# Patient Record
Sex: Female | Born: 1955 | Race: Black or African American | Hispanic: No | Marital: Single | Smoking: Never smoker
Health system: Southern US, Community
[De-identification: ages and names within clinical notes are randomized; demographics above are authoritative.]

## PROBLEM LIST (undated history)

## (undated) DIAGNOSIS — E785 Hyperlipidemia, unspecified: Secondary | ICD-10-CM

## (undated) DIAGNOSIS — R010 Benign and innocent cardiac murmurs: Secondary | ICD-10-CM

## (undated) DIAGNOSIS — I639 Cerebral infarction, unspecified: Secondary | ICD-10-CM

## (undated) DIAGNOSIS — D649 Anemia, unspecified: Secondary | ICD-10-CM

## (undated) DIAGNOSIS — Z9889 Other specified postprocedural states: Secondary | ICD-10-CM

## (undated) DIAGNOSIS — I10 Essential (primary) hypertension: Secondary | ICD-10-CM

## (undated) HISTORY — PX: BREAST EXCISIONAL BIOPSY: SUR124

## (undated) HISTORY — PX: ENDOMETRIAL BIOPSY: SHX622

## (undated) HISTORY — DX: Cerebral infarction, unspecified: I63.9

## (undated) HISTORY — DX: Anemia, unspecified: D64.9

## (undated) HISTORY — DX: Other specified postprocedural states: Z98.890

## (undated) HISTORY — DX: Benign and innocent cardiac murmurs: R01.0

## (undated) HISTORY — PX: BREAST CYST EXCISION: SHX579

## (undated) HISTORY — DX: Hyperlipidemia, unspecified: E78.5

## (undated) HISTORY — PX: BREAST BIOPSY: SHX20

## (undated) HISTORY — PX: ESOPHAGOGASTRODUODENOSCOPY: SHX1529

## (undated) HISTORY — DX: Essential (primary) hypertension: I10

---
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
## 1986-11-14 HISTORY — PX: OTHER SURGICAL HISTORY: SHX169

## 1998-02-16 ENCOUNTER — Encounter: Admission: RE | Admit: 1998-02-16 | Discharge: 1998-02-16 | Payer: Self-pay | Admitting: Sports Medicine

## 1998-02-19 ENCOUNTER — Encounter: Admission: RE | Admit: 1998-02-19 | Discharge: 1998-02-19 | Payer: Self-pay | Admitting: Sports Medicine

## 1998-03-06 ENCOUNTER — Encounter: Admission: RE | Admit: 1998-03-06 | Discharge: 1998-03-06 | Payer: Self-pay | Admitting: Family Medicine

## 1998-03-30 ENCOUNTER — Encounter: Admission: RE | Admit: 1998-03-30 | Discharge: 1998-03-30 | Payer: Self-pay | Admitting: Family Medicine

## 1998-04-01 ENCOUNTER — Encounter: Admission: RE | Admit: 1998-04-01 | Discharge: 1998-04-01 | Payer: Self-pay | Admitting: Family Medicine

## 1998-04-02 ENCOUNTER — Encounter: Admission: RE | Admit: 1998-04-02 | Discharge: 1998-04-02 | Payer: Self-pay | Admitting: Family Medicine

## 1998-04-22 ENCOUNTER — Encounter: Admission: RE | Admit: 1998-04-22 | Discharge: 1998-04-22 | Payer: Self-pay | Admitting: Family Medicine

## 1998-04-29 ENCOUNTER — Encounter: Admission: RE | Admit: 1998-04-29 | Discharge: 1998-04-29 | Payer: Self-pay | Admitting: Family Medicine

## 1998-04-30 ENCOUNTER — Encounter: Admission: RE | Admit: 1998-04-30 | Discharge: 1998-04-30 | Payer: Self-pay | Admitting: Sports Medicine

## 1998-06-16 ENCOUNTER — Encounter: Admission: RE | Admit: 1998-06-16 | Discharge: 1998-06-16 | Payer: Self-pay | Admitting: Family Medicine

## 1998-06-19 ENCOUNTER — Encounter: Admission: RE | Admit: 1998-06-19 | Discharge: 1998-06-19 | Payer: Self-pay | Admitting: Family Medicine

## 1998-06-22 ENCOUNTER — Encounter: Admission: RE | Admit: 1998-06-22 | Discharge: 1998-06-22 | Payer: Self-pay | Admitting: Family Medicine

## 1998-06-23 ENCOUNTER — Encounter: Admission: RE | Admit: 1998-06-23 | Discharge: 1998-06-23 | Payer: Self-pay | Admitting: Family Medicine

## 1998-06-24 ENCOUNTER — Encounter: Admission: RE | Admit: 1998-06-24 | Discharge: 1998-06-24 | Payer: Self-pay | Admitting: Family Medicine

## 1998-06-30 ENCOUNTER — Encounter: Admission: RE | Admit: 1998-06-30 | Discharge: 1998-06-30 | Payer: Self-pay | Admitting: Sports Medicine

## 1998-07-03 ENCOUNTER — Encounter: Admission: RE | Admit: 1998-07-03 | Discharge: 1998-10-01 | Payer: Self-pay | Admitting: Family Medicine

## 1998-07-22 ENCOUNTER — Encounter: Admission: RE | Admit: 1998-07-22 | Discharge: 1998-07-22 | Payer: Self-pay | Admitting: Family Medicine

## 1998-07-27 ENCOUNTER — Encounter: Admission: RE | Admit: 1998-07-27 | Discharge: 1998-07-27 | Payer: Self-pay | Admitting: Family Medicine

## 1998-08-11 ENCOUNTER — Encounter: Admission: RE | Admit: 1998-08-11 | Discharge: 1998-08-11 | Payer: Self-pay | Admitting: Family Medicine

## 1998-08-12 ENCOUNTER — Encounter: Admission: RE | Admit: 1998-08-12 | Discharge: 1998-08-12 | Payer: Self-pay | Admitting: Family Medicine

## 1998-09-03 ENCOUNTER — Encounter: Admission: RE | Admit: 1998-09-03 | Discharge: 1998-09-03 | Payer: Self-pay | Admitting: Family Medicine

## 1998-09-21 ENCOUNTER — Encounter: Admission: RE | Admit: 1998-09-21 | Discharge: 1998-09-21 | Payer: Self-pay | Admitting: Family Medicine

## 1998-10-01 ENCOUNTER — Encounter: Admission: RE | Admit: 1998-10-01 | Discharge: 1998-10-01 | Payer: Self-pay | Admitting: Family Medicine

## 1998-10-15 ENCOUNTER — Encounter: Admission: RE | Admit: 1998-10-15 | Discharge: 1998-10-15 | Payer: Self-pay | Admitting: Family Medicine

## 1998-11-24 ENCOUNTER — Encounter: Admission: RE | Admit: 1998-11-24 | Discharge: 1999-02-22 | Payer: Self-pay | Admitting: Family Medicine

## 1999-01-12 ENCOUNTER — Encounter: Admission: RE | Admit: 1999-01-12 | Discharge: 1999-01-12 | Payer: Self-pay | Admitting: Family Medicine

## 1999-02-02 ENCOUNTER — Encounter: Admission: RE | Admit: 1999-02-02 | Discharge: 1999-02-02 | Payer: Self-pay | Admitting: Family Medicine

## 1999-02-09 ENCOUNTER — Encounter: Admission: RE | Admit: 1999-02-09 | Discharge: 1999-02-09 | Payer: Self-pay | Admitting: Family Medicine

## 1999-02-22 ENCOUNTER — Encounter: Admission: RE | Admit: 1999-02-22 | Discharge: 1999-02-22 | Payer: Self-pay | Admitting: Family Medicine

## 1999-03-01 ENCOUNTER — Encounter: Admission: RE | Admit: 1999-03-01 | Discharge: 1999-03-01 | Payer: Self-pay | Admitting: Sports Medicine

## 1999-03-04 ENCOUNTER — Encounter: Admission: RE | Admit: 1999-03-04 | Discharge: 1999-03-04 | Payer: Self-pay | Admitting: Family Medicine

## 1999-06-16 ENCOUNTER — Encounter: Admission: RE | Admit: 1999-06-16 | Discharge: 1999-06-16 | Payer: Self-pay | Admitting: Family Medicine

## 1999-07-07 ENCOUNTER — Encounter: Admission: RE | Admit: 1999-07-07 | Discharge: 1999-07-07 | Payer: Self-pay | Admitting: Family Medicine

## 1999-09-06 ENCOUNTER — Encounter: Admission: RE | Admit: 1999-09-06 | Discharge: 1999-09-06 | Payer: Self-pay | Admitting: Family Medicine

## 1999-10-20 ENCOUNTER — Encounter: Payer: Self-pay | Admitting: *Deleted

## 1999-10-20 ENCOUNTER — Encounter: Admission: RE | Admit: 1999-10-20 | Discharge: 1999-10-20 | Payer: Self-pay | Admitting: *Deleted

## 1999-12-17 ENCOUNTER — Encounter: Admission: RE | Admit: 1999-12-17 | Discharge: 1999-12-17 | Payer: Self-pay | Admitting: Family Medicine

## 2000-01-18 ENCOUNTER — Encounter: Admission: RE | Admit: 2000-01-18 | Discharge: 2000-01-18 | Payer: Self-pay | Admitting: Sports Medicine

## 2000-02-21 ENCOUNTER — Encounter: Admission: RE | Admit: 2000-02-21 | Discharge: 2000-02-21 | Payer: Self-pay | Admitting: Family Medicine

## 2000-06-15 ENCOUNTER — Encounter: Admission: RE | Admit: 2000-06-15 | Discharge: 2000-06-15 | Payer: Self-pay | Admitting: Family Medicine

## 2000-06-23 ENCOUNTER — Encounter: Admission: RE | Admit: 2000-06-23 | Discharge: 2000-06-23 | Payer: Self-pay | Admitting: Family Medicine

## 2000-09-15 ENCOUNTER — Encounter: Admission: RE | Admit: 2000-09-15 | Discharge: 2000-09-15 | Payer: Self-pay | Admitting: Family Medicine

## 2000-09-19 ENCOUNTER — Encounter: Admission: RE | Admit: 2000-09-19 | Discharge: 2000-09-19 | Payer: Self-pay | Admitting: Family Medicine

## 2000-11-27 ENCOUNTER — Encounter: Payer: Self-pay | Admitting: Obstetrics and Gynecology

## 2000-11-27 ENCOUNTER — Encounter: Admission: RE | Admit: 2000-11-27 | Discharge: 2000-11-27 | Payer: Self-pay | Admitting: Obstetrics and Gynecology

## 2000-12-01 ENCOUNTER — Encounter: Admission: RE | Admit: 2000-12-01 | Discharge: 2000-12-01 | Payer: Self-pay | Admitting: Family Medicine

## 2000-12-12 ENCOUNTER — Encounter: Admission: RE | Admit: 2000-12-12 | Discharge: 2000-12-12 | Payer: Self-pay | Admitting: *Deleted

## 2000-12-12 ENCOUNTER — Encounter: Payer: Self-pay | Admitting: *Deleted

## 2001-01-05 ENCOUNTER — Encounter: Admission: RE | Admit: 2001-01-05 | Discharge: 2001-01-05 | Payer: Self-pay | Admitting: Family Medicine

## 2001-03-06 ENCOUNTER — Encounter: Admission: RE | Admit: 2001-03-06 | Discharge: 2001-03-06 | Payer: Self-pay | Admitting: Sports Medicine

## 2001-03-16 ENCOUNTER — Encounter: Admission: RE | Admit: 2001-03-16 | Discharge: 2001-03-16 | Payer: Self-pay | Admitting: Family Medicine

## 2001-04-30 ENCOUNTER — Encounter: Admission: RE | Admit: 2001-04-30 | Discharge: 2001-04-30 | Payer: Self-pay | Admitting: Family Medicine

## 2001-06-29 ENCOUNTER — Encounter: Admission: RE | Admit: 2001-06-29 | Discharge: 2001-06-29 | Payer: Self-pay | Admitting: Family Medicine

## 2001-07-19 ENCOUNTER — Encounter: Admission: RE | Admit: 2001-07-19 | Discharge: 2001-07-19 | Payer: Self-pay | Admitting: Family Medicine

## 2001-09-25 ENCOUNTER — Other Ambulatory Visit: Admission: RE | Admit: 2001-09-25 | Discharge: 2001-10-16 | Payer: Self-pay | Admitting: Obstetrics & Gynecology

## 2001-09-25 ENCOUNTER — Encounter (INDEPENDENT_AMBULATORY_CARE_PROVIDER_SITE_OTHER): Payer: Self-pay | Admitting: *Deleted

## 2001-09-25 ENCOUNTER — Encounter: Admission: RE | Admit: 2001-09-25 | Discharge: 2001-09-25 | Payer: Self-pay | Admitting: Family Medicine

## 2001-09-28 ENCOUNTER — Encounter: Admission: RE | Admit: 2001-09-28 | Discharge: 2001-09-28 | Payer: Self-pay | Admitting: Family Medicine

## 2001-10-22 ENCOUNTER — Encounter: Admission: RE | Admit: 2001-10-22 | Discharge: 2001-10-22 | Payer: Self-pay | Admitting: Family Medicine

## 2001-12-18 ENCOUNTER — Encounter: Payer: Self-pay | Admitting: Family Medicine

## 2001-12-18 ENCOUNTER — Encounter: Admission: RE | Admit: 2001-12-18 | Discharge: 2001-12-18 | Payer: Self-pay | Admitting: Family Medicine

## 2002-01-30 ENCOUNTER — Encounter: Admission: RE | Admit: 2002-01-30 | Discharge: 2002-01-30 | Payer: Self-pay | Admitting: Family Medicine

## 2002-05-21 ENCOUNTER — Encounter: Admission: RE | Admit: 2002-05-21 | Discharge: 2002-05-21 | Payer: Self-pay | Admitting: Family Medicine

## 2002-05-23 ENCOUNTER — Encounter: Admission: RE | Admit: 2002-05-23 | Discharge: 2002-05-23 | Payer: Self-pay | Admitting: Family Medicine

## 2002-05-27 ENCOUNTER — Encounter: Admission: RE | Admit: 2002-05-27 | Discharge: 2002-05-27 | Payer: Self-pay | Admitting: Family Medicine

## 2002-06-08 ENCOUNTER — Encounter: Payer: Self-pay | Admitting: *Deleted

## 2002-06-08 ENCOUNTER — Emergency Department (HOSPITAL_COMMUNITY): Admission: EM | Admit: 2002-06-08 | Discharge: 2002-06-08 | Payer: Self-pay | Admitting: Emergency Medicine

## 2002-06-17 ENCOUNTER — Encounter: Admission: RE | Admit: 2002-06-17 | Discharge: 2002-06-17 | Payer: Self-pay | Admitting: Family Medicine

## 2002-07-17 ENCOUNTER — Encounter: Admission: RE | Admit: 2002-07-17 | Discharge: 2002-07-17 | Payer: Self-pay | Admitting: Family Medicine

## 2002-08-22 ENCOUNTER — Encounter: Admission: RE | Admit: 2002-08-22 | Discharge: 2002-08-22 | Payer: Self-pay | Admitting: Family Medicine

## 2002-10-15 ENCOUNTER — Encounter: Admission: RE | Admit: 2002-10-15 | Discharge: 2002-10-15 | Payer: Self-pay | Admitting: Sports Medicine

## 2002-10-21 ENCOUNTER — Encounter: Admission: RE | Admit: 2002-10-21 | Discharge: 2002-10-21 | Payer: Self-pay | Admitting: Family Medicine

## 2003-01-17 ENCOUNTER — Encounter: Admission: RE | Admit: 2003-01-17 | Discharge: 2003-01-17 | Payer: Self-pay | Admitting: Family Medicine

## 2003-04-09 ENCOUNTER — Encounter: Admission: RE | Admit: 2003-04-09 | Discharge: 2003-04-09 | Payer: Self-pay | Admitting: Family Medicine

## 2003-05-08 ENCOUNTER — Encounter: Admission: RE | Admit: 2003-05-08 | Discharge: 2003-05-08 | Payer: Self-pay | Admitting: Family Medicine

## 2003-07-30 ENCOUNTER — Encounter: Admission: RE | Admit: 2003-07-30 | Discharge: 2003-07-30 | Payer: Self-pay | Admitting: Family Medicine

## 2003-09-07 ENCOUNTER — Emergency Department (HOSPITAL_COMMUNITY): Admission: EM | Admit: 2003-09-07 | Discharge: 2003-09-07 | Payer: Self-pay | Admitting: *Deleted

## 2003-09-07 ENCOUNTER — Encounter: Payer: Self-pay | Admitting: Emergency Medicine

## 2003-09-10 ENCOUNTER — Encounter: Admission: RE | Admit: 2003-09-10 | Discharge: 2003-09-10 | Payer: Self-pay | Admitting: Family Medicine

## 2003-10-17 ENCOUNTER — Encounter: Admission: RE | Admit: 2003-10-17 | Discharge: 2003-10-17 | Payer: Self-pay | Admitting: Family Medicine

## 2003-10-20 ENCOUNTER — Encounter: Admission: RE | Admit: 2003-10-20 | Discharge: 2003-10-20 | Payer: Self-pay | Admitting: Family Medicine

## 2003-10-30 ENCOUNTER — Encounter: Admission: RE | Admit: 2003-10-30 | Discharge: 2003-12-01 | Payer: Self-pay | Admitting: Sports Medicine

## 2003-11-21 ENCOUNTER — Encounter: Admission: RE | Admit: 2003-11-21 | Discharge: 2003-11-21 | Payer: Self-pay | Admitting: Family Medicine

## 2003-12-05 ENCOUNTER — Encounter: Admission: RE | Admit: 2003-12-05 | Discharge: 2003-12-05 | Payer: Self-pay | Admitting: Sports Medicine

## 2004-01-13 HISTORY — PX: COLONOSCOPY: SHX174

## 2004-01-14 ENCOUNTER — Encounter: Admission: RE | Admit: 2004-01-14 | Discharge: 2004-01-14 | Payer: Self-pay | Admitting: Family Medicine

## 2004-01-19 ENCOUNTER — Encounter: Admission: RE | Admit: 2004-01-19 | Discharge: 2004-01-19 | Payer: Self-pay | Admitting: Family Medicine

## 2004-01-21 ENCOUNTER — Encounter: Admission: RE | Admit: 2004-01-21 | Discharge: 2004-01-21 | Payer: Self-pay | Admitting: Sports Medicine

## 2004-01-28 ENCOUNTER — Ambulatory Visit (HOSPITAL_COMMUNITY): Admission: RE | Admit: 2004-01-28 | Discharge: 2004-01-28 | Payer: Self-pay | Admitting: Internal Medicine

## 2004-02-11 ENCOUNTER — Encounter: Admission: RE | Admit: 2004-02-11 | Discharge: 2004-02-11 | Payer: Self-pay | Admitting: Family Medicine

## 2004-03-23 ENCOUNTER — Encounter: Admission: RE | Admit: 2004-03-23 | Discharge: 2004-03-23 | Payer: Self-pay | Admitting: Family Medicine

## 2004-04-16 ENCOUNTER — Encounter: Admission: RE | Admit: 2004-04-16 | Discharge: 2004-04-16 | Payer: Self-pay | Admitting: Sports Medicine

## 2004-08-09 ENCOUNTER — Ambulatory Visit: Payer: Self-pay | Admitting: Family Medicine

## 2004-10-18 ENCOUNTER — Ambulatory Visit: Payer: Self-pay | Admitting: Sports Medicine

## 2004-11-26 ENCOUNTER — Ambulatory Visit: Payer: Self-pay | Admitting: Family Medicine

## 2005-01-21 ENCOUNTER — Ambulatory Visit: Payer: Self-pay | Admitting: Family Medicine

## 2005-03-28 ENCOUNTER — Ambulatory Visit: Payer: Self-pay | Admitting: Family Medicine

## 2005-04-08 ENCOUNTER — Encounter: Admission: RE | Admit: 2005-04-08 | Discharge: 2005-04-08 | Payer: Self-pay | Admitting: Sports Medicine

## 2005-04-26 ENCOUNTER — Ambulatory Visit: Payer: Self-pay | Admitting: Sports Medicine

## 2005-06-03 ENCOUNTER — Ambulatory Visit: Payer: Self-pay | Admitting: Family Medicine

## 2005-08-11 ENCOUNTER — Ambulatory Visit: Payer: Self-pay | Admitting: Family Medicine

## 2005-08-18 ENCOUNTER — Ambulatory Visit: Payer: Self-pay | Admitting: Family Medicine

## 2005-09-05 ENCOUNTER — Ambulatory Visit: Payer: Self-pay | Admitting: Sports Medicine

## 2005-10-04 ENCOUNTER — Ambulatory Visit: Payer: Self-pay | Admitting: Family Medicine

## 2005-11-17 ENCOUNTER — Ambulatory Visit: Payer: Self-pay | Admitting: Family Medicine

## 2005-12-22 ENCOUNTER — Ambulatory Visit: Payer: Self-pay | Admitting: Family Medicine

## 2006-03-02 ENCOUNTER — Ambulatory Visit: Payer: Self-pay | Admitting: Family Medicine

## 2006-03-14 ENCOUNTER — Encounter (INDEPENDENT_AMBULATORY_CARE_PROVIDER_SITE_OTHER): Payer: Self-pay | Admitting: *Deleted

## 2006-03-14 LAB — CONVERTED CEMR LAB

## 2006-03-29 ENCOUNTER — Ambulatory Visit (HOSPITAL_COMMUNITY): Admission: RE | Admit: 2006-03-29 | Discharge: 2006-03-29 | Payer: Self-pay | Admitting: Family Medicine

## 2006-03-29 ENCOUNTER — Ambulatory Visit: Payer: Self-pay | Admitting: Family Medicine

## 2006-04-05 ENCOUNTER — Ambulatory Visit: Payer: Self-pay | Admitting: Family Medicine

## 2006-05-03 ENCOUNTER — Ambulatory Visit: Payer: Self-pay | Admitting: Cardiovascular Disease

## 2006-05-03 ENCOUNTER — Encounter: Payer: Self-pay | Admitting: Cardiovascular Disease

## 2006-05-03 ENCOUNTER — Ambulatory Visit (HOSPITAL_COMMUNITY): Admission: RE | Admit: 2006-05-03 | Discharge: 2006-05-03 | Payer: Self-pay | Admitting: Family Medicine

## 2006-05-10 ENCOUNTER — Ambulatory Visit: Payer: Self-pay | Admitting: Family Medicine

## 2006-05-24 ENCOUNTER — Ambulatory Visit (HOSPITAL_COMMUNITY): Admission: RE | Admit: 2006-05-24 | Discharge: 2006-05-24 | Payer: Self-pay | Admitting: Family Medicine

## 2006-07-24 ENCOUNTER — Ambulatory Visit: Payer: Self-pay | Admitting: Sports Medicine

## 2006-09-20 ENCOUNTER — Ambulatory Visit: Payer: Self-pay | Admitting: Family Medicine

## 2006-11-20 ENCOUNTER — Ambulatory Visit: Payer: Self-pay | Admitting: Family Medicine

## 2006-12-06 ENCOUNTER — Ambulatory Visit: Payer: Self-pay | Admitting: Family Medicine

## 2006-12-06 ENCOUNTER — Encounter (INDEPENDENT_AMBULATORY_CARE_PROVIDER_SITE_OTHER): Payer: Self-pay | Admitting: Family Medicine

## 2006-12-06 LAB — CONVERTED CEMR LAB
ALT: 9 units/L (ref 0–35)
AST: 21 units/L (ref 0–37)
Albumin: 4.1 g/dL (ref 3.5–5.2)
BUN: 12 mg/dL (ref 6–23)
Calcium: 9.4 mg/dL (ref 8.4–10.5)
Creatinine, Ser: 0.73 mg/dL (ref 0.40–1.20)
Direct LDL: 59 mg/dL
Glucose, Bld: 93 mg/dL (ref 70–99)
Hemoglobin: 5.5 g/dL — CL (ref 12.0–15.0)
MCHC: 26.6 g/dL — ABNORMAL LOW (ref 30.0–36.0)
MCV: 60.5 fL — ABNORMAL LOW (ref 78.0–100.0)
Platelets: 375 10*3/uL (ref 150–400)
Potassium: 4 meq/L (ref 3.5–5.3)
WBC: 6.1 10*3/uL (ref 4.0–10.5)

## 2006-12-08 ENCOUNTER — Ambulatory Visit: Payer: Self-pay | Admitting: Family Medicine

## 2006-12-08 ENCOUNTER — Encounter (INDEPENDENT_AMBULATORY_CARE_PROVIDER_SITE_OTHER): Payer: Self-pay | Admitting: Family Medicine

## 2006-12-08 LAB — CONVERTED CEMR LAB
Folate: >20 ng/mL
Iron: <10 ug/dL — ABNORMAL LOW (ref 42–145)
UIBC: 393 ug/dL

## 2006-12-12 ENCOUNTER — Ambulatory Visit: Payer: Self-pay | Admitting: Internal Medicine

## 2006-12-20 ENCOUNTER — Ambulatory Visit: Payer: Self-pay | Admitting: Internal Medicine

## 2007-01-11 DIAGNOSIS — E1169 Type 2 diabetes mellitus with other specified complication: Secondary | ICD-10-CM | POA: Insufficient documentation

## 2007-01-11 DIAGNOSIS — E1159 Type 2 diabetes mellitus with other circulatory complications: Secondary | ICD-10-CM | POA: Insufficient documentation

## 2007-01-11 DIAGNOSIS — E785 Hyperlipidemia, unspecified: Secondary | ICD-10-CM

## 2007-01-11 DIAGNOSIS — D509 Iron deficiency anemia, unspecified: Secondary | ICD-10-CM | POA: Insufficient documentation

## 2007-01-11 DIAGNOSIS — I10 Essential (primary) hypertension: Secondary | ICD-10-CM | POA: Insufficient documentation

## 2007-01-11 DIAGNOSIS — E119 Type 2 diabetes mellitus without complications: Secondary | ICD-10-CM | POA: Insufficient documentation

## 2007-01-12 ENCOUNTER — Encounter (INDEPENDENT_AMBULATORY_CARE_PROVIDER_SITE_OTHER): Payer: Self-pay | Admitting: *Deleted

## 2007-01-15 DIAGNOSIS — R011 Cardiac murmur, unspecified: Secondary | ICD-10-CM | POA: Insufficient documentation

## 2007-02-11 ENCOUNTER — Inpatient Hospital Stay (HOSPITAL_COMMUNITY): Admission: EM | Admit: 2007-02-11 | Discharge: 2007-02-13 | Payer: Self-pay | Admitting: Emergency Medicine

## 2007-02-11 ENCOUNTER — Ambulatory Visit: Payer: Self-pay | Admitting: Family Medicine

## 2007-02-12 ENCOUNTER — Encounter (INDEPENDENT_AMBULATORY_CARE_PROVIDER_SITE_OTHER): Payer: Self-pay | Admitting: Cardiology

## 2007-02-12 DIAGNOSIS — Z8673 Personal history of transient ischemic attack (TIA), and cerebral infarction without residual deficits: Secondary | ICD-10-CM | POA: Insufficient documentation

## 2007-02-23 ENCOUNTER — Telehealth: Payer: Self-pay | Admitting: *Deleted

## 2007-02-26 ENCOUNTER — Encounter (INDEPENDENT_AMBULATORY_CARE_PROVIDER_SITE_OTHER): Payer: Self-pay | Admitting: Family Medicine

## 2007-02-26 ENCOUNTER — Ambulatory Visit: Payer: Self-pay | Admitting: Family Medicine

## 2007-03-02 ENCOUNTER — Ambulatory Visit: Payer: Self-pay | Admitting: Family Medicine

## 2007-03-02 ENCOUNTER — Encounter (INDEPENDENT_AMBULATORY_CARE_PROVIDER_SITE_OTHER): Payer: Self-pay | Admitting: Family Medicine

## 2007-03-02 LAB — CONVERTED CEMR LAB: Hgb A1c MFr Bld: 6.9 %

## 2007-03-13 ENCOUNTER — Ambulatory Visit: Payer: Self-pay | Admitting: Internal Medicine

## 2007-03-13 LAB — CONVERTED CEMR LAB
Basophils Absolute: 0 10*3/uL (ref 0.0–0.1)
Basophils Relative: 0.4 % (ref 0.0–1.0)
Eosinophils Absolute: 0.1 10*3/uL (ref 0.0–0.6)
Eosinophils Relative: 2.4 % (ref 0.0–5.0)
HCT: 29.1 % — ABNORMAL LOW (ref 36.0–46.0)
Hemoglobin: 9.1 g/dL — ABNORMAL LOW (ref 12.0–15.0)
Lymphocytes Relative: 21.4 % (ref 12.0–46.0)
MCHC: 31.3 g/dL (ref 30.0–36.0)
MCV: 77 fL — ABNORMAL LOW (ref 78.0–100.0)
Monocytes Absolute: 0.5 10*3/uL (ref 0.2–0.7)
Monocytes Relative: 8.6 % (ref 3.0–11.0)
Neutro Abs: 3.8 10*3/uL (ref 1.4–7.7)
Neutrophils Relative %: 67.2 % (ref 43.0–77.0)
Platelets: 234 10*3/uL (ref 150–400)
RBC: 3.77 M/uL — ABNORMAL LOW (ref 3.87–5.11)
RDW: 20.8 % — ABNORMAL HIGH (ref 11.5–14.6)
WBC: 5.6 10*3/uL (ref 4.5–10.5)

## 2007-03-14 ENCOUNTER — Telehealth: Payer: Self-pay | Admitting: *Deleted

## 2007-03-14 ENCOUNTER — Telehealth (INDEPENDENT_AMBULATORY_CARE_PROVIDER_SITE_OTHER): Payer: Self-pay | Admitting: *Deleted

## 2007-03-15 ENCOUNTER — Encounter (INDEPENDENT_AMBULATORY_CARE_PROVIDER_SITE_OTHER): Payer: Self-pay | Admitting: Family Medicine

## 2007-03-28 ENCOUNTER — Ambulatory Visit: Payer: Self-pay | Admitting: Family Medicine

## 2007-03-28 ENCOUNTER — Encounter (INDEPENDENT_AMBULATORY_CARE_PROVIDER_SITE_OTHER): Payer: Self-pay | Admitting: Family Medicine

## 2007-03-28 LAB — CONVERTED CEMR LAB
Calcium: 10 mg/dL (ref 8.4–10.5)
Chloride: 98 meq/L (ref 96–112)
Cholesterol, target level: 200 mg/dL
Creatinine, Ser: 0.8 mg/dL (ref 0.40–1.20)
Direct LDL: 69 mg/dL
HDL goal, serum: 40 mg/dL
Hgb A1c MFr Bld: 8.3 %
LDL Goal: 70 mg/dL
Sodium: 135 meq/L (ref 135–145)

## 2007-04-27 ENCOUNTER — Encounter (INDEPENDENT_AMBULATORY_CARE_PROVIDER_SITE_OTHER): Payer: Self-pay | Admitting: Family Medicine

## 2007-04-27 ENCOUNTER — Ambulatory Visit: Payer: Self-pay | Admitting: Family Medicine

## 2007-04-27 LAB — CONVERTED CEMR LAB
AST: 15 units/L (ref 0–37)
Albumin: 4 g/dL (ref 3.5–5.2)
Alkaline Phosphatase: 97 units/L (ref 39–117)
Bilirubin, Direct: 0.1 mg/dL (ref 0.0–0.3)
Total Bilirubin: 0.3 mg/dL (ref 0.3–1.2)

## 2007-05-10 ENCOUNTER — Telehealth: Payer: Self-pay | Admitting: *Deleted

## 2007-05-16 ENCOUNTER — Encounter (INDEPENDENT_AMBULATORY_CARE_PROVIDER_SITE_OTHER): Payer: Self-pay | Admitting: Family Medicine

## 2007-05-16 ENCOUNTER — Ambulatory Visit: Payer: Self-pay | Admitting: Family Medicine

## 2007-05-16 LAB — CONVERTED CEMR LAB
ALT: 12 units/L (ref 0–35)
AST: 25 units/L (ref 0–37)
BUN: 17 mg/dL (ref 6–23)
CO2: 24 meq/L (ref 19–32)
Chloride: 91 meq/L — ABNORMAL LOW (ref 96–112)
Direct LDL: 62 mg/dL
Glucose, Bld: 474 mg/dL — ABNORMAL HIGH (ref 70–99)
Potassium: 5.7 meq/L — ABNORMAL HIGH (ref 3.5–5.3)
Total Bilirubin: 0.3 mg/dL (ref 0.3–1.2)
Total Protein: 7.6 g/dL (ref 6.0–8.3)

## 2007-05-22 ENCOUNTER — Telehealth: Payer: Self-pay | Admitting: *Deleted

## 2007-06-11 ENCOUNTER — Telehealth: Payer: Self-pay | Admitting: *Deleted

## 2007-06-12 ENCOUNTER — Ambulatory Visit: Payer: Self-pay | Admitting: Family Medicine

## 2007-06-15 ENCOUNTER — Ambulatory Visit: Payer: Self-pay | Admitting: Family Medicine

## 2007-06-15 LAB — CONVERTED CEMR LAB: Blood Glucose, Fingerstick: 273

## 2007-07-11 ENCOUNTER — Ambulatory Visit: Payer: Self-pay | Admitting: Family Medicine

## 2007-07-23 ENCOUNTER — Telehealth: Payer: Self-pay | Admitting: *Deleted

## 2007-07-25 ENCOUNTER — Ambulatory Visit: Payer: Self-pay | Admitting: Family Medicine

## 2007-08-15 ENCOUNTER — Ambulatory Visit: Payer: Self-pay | Admitting: Family Medicine

## 2007-08-15 ENCOUNTER — Encounter (INDEPENDENT_AMBULATORY_CARE_PROVIDER_SITE_OTHER): Payer: Self-pay | Admitting: Family Medicine

## 2007-08-15 DIAGNOSIS — K5731 Diverticulosis of large intestine without perforation or abscess with bleeding: Secondary | ICD-10-CM | POA: Insufficient documentation

## 2007-08-15 LAB — CONVERTED CEMR LAB
BUN: 13 mg/dL (ref 6–23)
Calcium: 9.6 mg/dL (ref 8.4–10.5)
Glucose, Bld: 254 mg/dL — ABNORMAL HIGH (ref 70–99)
HCT: 33.8 % — ABNORMAL LOW (ref 36.0–46.0)
Hemoglobin: 10.4 g/dL — ABNORMAL LOW (ref 12.0–15.0)
RBC: 3.59 M/uL — ABNORMAL LOW (ref 3.87–5.11)
RDW: 15.3 % — ABNORMAL HIGH (ref 11.5–14.0)

## 2007-10-03 ENCOUNTER — Ambulatory Visit (HOSPITAL_COMMUNITY): Admission: RE | Admit: 2007-10-03 | Discharge: 2007-10-03 | Payer: Self-pay | Admitting: Family Medicine

## 2007-10-25 ENCOUNTER — Ambulatory Visit: Payer: Self-pay | Admitting: Family Medicine

## 2007-10-25 LAB — CONVERTED CEMR LAB
Glucose, Urine, Semiquant: NEGATIVE
Hgb A1c MFr Bld: 8.2 %
Protein, U semiquant: NEGATIVE
pH: 6.5

## 2007-11-22 ENCOUNTER — Ambulatory Visit: Payer: Self-pay | Admitting: Sports Medicine

## 2007-11-30 ENCOUNTER — Ambulatory Visit: Payer: Self-pay | Admitting: Family Medicine

## 2007-11-30 DIAGNOSIS — E669 Obesity, unspecified: Secondary | ICD-10-CM

## 2007-11-30 DIAGNOSIS — E663 Overweight: Secondary | ICD-10-CM | POA: Insufficient documentation

## 2007-12-24 ENCOUNTER — Ambulatory Visit: Payer: Self-pay | Admitting: Family Medicine

## 2007-12-24 ENCOUNTER — Encounter (INDEPENDENT_AMBULATORY_CARE_PROVIDER_SITE_OTHER): Payer: Self-pay | Admitting: Family Medicine

## 2007-12-24 LAB — CONVERTED CEMR LAB: Cholesterol: 143 mg/dL (ref 0–200)

## 2008-01-03 ENCOUNTER — Ambulatory Visit: Payer: Self-pay | Admitting: Family Medicine

## 2008-01-03 ENCOUNTER — Encounter (INDEPENDENT_AMBULATORY_CARE_PROVIDER_SITE_OTHER): Payer: Self-pay | Admitting: Family Medicine

## 2008-02-05 ENCOUNTER — Ambulatory Visit: Payer: Self-pay | Admitting: Family Medicine

## 2008-02-05 LAB — CONVERTED CEMR LAB: Hgb A1c MFr Bld: 8.7 %

## 2008-03-17 ENCOUNTER — Encounter (INDEPENDENT_AMBULATORY_CARE_PROVIDER_SITE_OTHER): Payer: Self-pay | Admitting: Family Medicine

## 2008-04-22 ENCOUNTER — Encounter (INDEPENDENT_AMBULATORY_CARE_PROVIDER_SITE_OTHER): Payer: Self-pay | Admitting: *Deleted

## 2008-04-28 ENCOUNTER — Telehealth: Payer: Self-pay | Admitting: *Deleted

## 2008-04-28 ENCOUNTER — Encounter (INDEPENDENT_AMBULATORY_CARE_PROVIDER_SITE_OTHER): Payer: Self-pay | Admitting: Family Medicine

## 2008-04-28 ENCOUNTER — Ambulatory Visit: Payer: Self-pay | Admitting: Family Medicine

## 2008-04-28 ENCOUNTER — Ambulatory Visit (HOSPITAL_COMMUNITY): Admission: RE | Admit: 2008-04-28 | Discharge: 2008-04-28 | Payer: Self-pay | Admitting: Family Medicine

## 2008-04-29 ENCOUNTER — Encounter (INDEPENDENT_AMBULATORY_CARE_PROVIDER_SITE_OTHER): Payer: Self-pay | Admitting: Family Medicine

## 2008-05-14 ENCOUNTER — Ambulatory Visit: Payer: Self-pay | Admitting: Family Medicine

## 2008-05-14 DIAGNOSIS — F17200 Nicotine dependence, unspecified, uncomplicated: Secondary | ICD-10-CM | POA: Insufficient documentation

## 2008-05-14 LAB — CONVERTED CEMR LAB: Hgb A1c MFr Bld: 8 %

## 2008-08-06 ENCOUNTER — Ambulatory Visit: Payer: Self-pay | Admitting: Family Medicine

## 2008-08-06 ENCOUNTER — Encounter (INDEPENDENT_AMBULATORY_CARE_PROVIDER_SITE_OTHER): Payer: Self-pay | Admitting: Family Medicine

## 2008-08-06 DIAGNOSIS — Z780 Asymptomatic menopausal state: Secondary | ICD-10-CM | POA: Insufficient documentation

## 2008-08-06 LAB — CONVERTED CEMR LAB
Calcium: 10.3 mg/dL (ref 8.4–10.5)
Glucose, Bld: 299 mg/dL — ABNORMAL HIGH (ref 70–99)
Hgb A1c MFr Bld: 9.1 %
Potassium: 4.8 meq/L (ref 3.5–5.3)
Sodium: 137 meq/L (ref 135–145)

## 2008-08-07 ENCOUNTER — Encounter (INDEPENDENT_AMBULATORY_CARE_PROVIDER_SITE_OTHER): Payer: Self-pay | Admitting: Family Medicine

## 2008-10-17 ENCOUNTER — Ambulatory Visit: Payer: Self-pay | Admitting: Family Medicine

## 2008-10-21 ENCOUNTER — Ambulatory Visit (HOSPITAL_COMMUNITY): Admission: RE | Admit: 2008-10-21 | Discharge: 2008-10-21 | Payer: Self-pay | Admitting: Family Medicine

## 2009-02-03 ENCOUNTER — Encounter (INDEPENDENT_AMBULATORY_CARE_PROVIDER_SITE_OTHER): Payer: Self-pay | Admitting: Family Medicine

## 2009-02-03 ENCOUNTER — Ambulatory Visit: Payer: Self-pay | Admitting: Family Medicine

## 2009-02-03 DIAGNOSIS — J309 Allergic rhinitis, unspecified: Secondary | ICD-10-CM | POA: Insufficient documentation

## 2009-02-03 LAB — CONVERTED CEMR LAB
Albumin: 4.1 g/dL (ref 3.5–5.2)
BUN: 12 mg/dL (ref 6–23)
CO2: 25 meq/L (ref 19–32)
Calcium: 9.3 mg/dL (ref 8.4–10.5)
Cholesterol: 139 mg/dL (ref 0–200)
HCT: 34.9 % — ABNORMAL LOW (ref 36.0–46.0)
HDL: 60 mg/dL (ref >39)
LDL Cholesterol: 59 mg/dL (ref 0–99)
MCHC: 31.8 g/dL (ref 30.0–36.0)
MCV: 87.3 fL (ref 78.0–100.0)
Potassium: 4.9 meq/L (ref 3.5–5.3)
RBC: 4 M/uL (ref 3.87–5.11)
RDW: 13.4 % (ref 11.5–15.5)
Total Protein: 7.2 g/dL (ref 6.0–8.3)
WBC: 5.9 10*3/uL (ref 4.0–10.5)

## 2009-02-04 ENCOUNTER — Encounter (INDEPENDENT_AMBULATORY_CARE_PROVIDER_SITE_OTHER): Payer: Self-pay | Admitting: Family Medicine

## 2009-03-15 ENCOUNTER — Emergency Department (HOSPITAL_COMMUNITY): Admission: EM | Admit: 2009-03-15 | Discharge: 2009-03-15 | Payer: Self-pay | Admitting: Emergency Medicine

## 2009-03-24 ENCOUNTER — Ambulatory Visit: Payer: Self-pay | Admitting: Family Medicine

## 2009-05-13 ENCOUNTER — Telehealth: Payer: Self-pay | Admitting: *Deleted

## 2009-05-13 ENCOUNTER — Ambulatory Visit: Payer: Self-pay | Admitting: Family Medicine

## 2009-05-13 DIAGNOSIS — K029 Dental caries, unspecified: Secondary | ICD-10-CM | POA: Insufficient documentation

## 2009-06-15 ENCOUNTER — Encounter: Payer: Self-pay | Admitting: Family Medicine

## 2009-06-16 ENCOUNTER — Encounter: Payer: Self-pay | Admitting: Family Medicine

## 2009-08-10 ENCOUNTER — Ambulatory Visit: Payer: Self-pay | Admitting: Family Medicine

## 2009-08-10 LAB — CONVERTED CEMR LAB: Hgb A1c MFr Bld: 7.3 %

## 2009-08-26 ENCOUNTER — Encounter: Payer: Self-pay | Admitting: Family Medicine

## 2009-08-28 ENCOUNTER — Ambulatory Visit: Payer: Self-pay | Admitting: Family Medicine

## 2009-10-07 ENCOUNTER — Encounter: Payer: Self-pay | Admitting: Family Medicine

## 2009-10-21 ENCOUNTER — Ambulatory Visit: Payer: Self-pay | Admitting: Family Medicine

## 2009-11-17 ENCOUNTER — Ambulatory Visit (HOSPITAL_COMMUNITY): Admission: RE | Admit: 2009-11-17 | Discharge: 2009-11-17 | Payer: Self-pay | Admitting: Internal Medicine

## 2009-11-17 ENCOUNTER — Telehealth: Payer: Self-pay | Admitting: *Deleted

## 2009-12-16 ENCOUNTER — Ambulatory Visit: Payer: Self-pay | Admitting: Family Medicine

## 2009-12-16 DIAGNOSIS — H269 Unspecified cataract: Secondary | ICD-10-CM | POA: Insufficient documentation

## 2009-12-16 LAB — CONVERTED CEMR LAB: Hgb A1c MFr Bld: 8.4 %

## 2009-12-29 ENCOUNTER — Encounter: Payer: Self-pay | Admitting: Family Medicine

## 2010-02-22 ENCOUNTER — Encounter: Payer: Self-pay | Admitting: Family Medicine

## 2010-02-22 ENCOUNTER — Ambulatory Visit: Payer: Self-pay | Admitting: Family Medicine

## 2010-02-22 ENCOUNTER — Other Ambulatory Visit: Admission: RE | Admit: 2010-02-22 | Discharge: 2010-02-22 | Payer: Self-pay | Admitting: Family Medicine

## 2010-02-22 LAB — CONVERTED CEMR LAB
Chlamydia, DNA Probe: NEGATIVE
GC Probe Amp, Genital: NEGATIVE
Pap Smear: NEGATIVE
Whiff Test: POSITIVE

## 2010-02-25 LAB — CONVERTED CEMR LAB
ALT: <8 units/L (ref 0–35)
AST: 11 units/L (ref 0–37)
Albumin: 4.2 g/dL (ref 3.5–5.2)
Alkaline Phosphatase: 87 units/L (ref 39–117)
BUN: 12 mg/dL (ref 6–23)
CO2: 26 meq/L (ref 19–32)
Calcium: 9.9 mg/dL (ref 8.4–10.5)
Chloride: 97 meq/L (ref 96–112)
Cholesterol: 180 mg/dL (ref 0–200)
Creatinine, Ser: 0.72 mg/dL (ref 0.40–1.20)
Glucose, Bld: 124 mg/dL — ABNORMAL HIGH (ref 70–99)
HCT: 34.5 % — ABNORMAL LOW (ref 36.0–46.0)
HDL: 62 mg/dL (ref >39)
Hemoglobin: 11 g/dL — ABNORMAL LOW (ref 12.0–15.0)
LDL Cholesterol: 96 mg/dL (ref 0–99)
MCHC: 31.9 g/dL (ref 30.0–36.0)
MCV: 85.4 fL (ref 78.0–100.0)
Platelets: 298 10*3/uL (ref 150–400)
Potassium: 4.8 meq/L (ref 3.5–5.3)
RBC: 4.04 M/uL (ref 3.87–5.11)
RDW: 13.2 % (ref 11.5–15.5)
Sodium: 136 meq/L (ref 135–145)
Total Bilirubin: 0.4 mg/dL (ref 0.3–1.2)
Total CHOL/HDL Ratio: 2.9
Total Protein: 7.1 g/dL (ref 6.0–8.3)
Triglycerides: 109 mg/dL (ref <150)
VLDL: 22 mg/dL (ref 0–40)
Vit D, 25-Hydroxy: 10 ng/mL — ABNORMAL LOW (ref 30–89)
WBC: 5.1 10*3/uL (ref 4.0–10.5)

## 2010-05-28 ENCOUNTER — Ambulatory Visit: Payer: Self-pay | Admitting: Family Medicine

## 2010-05-28 DIAGNOSIS — G4700 Insomnia, unspecified: Secondary | ICD-10-CM | POA: Insufficient documentation

## 2010-05-28 LAB — CONVERTED CEMR LAB: Hgb A1c MFr Bld: 8.3 %

## 2010-06-10 ENCOUNTER — Ambulatory Visit: Payer: Self-pay | Admitting: Family Medicine

## 2010-06-23 ENCOUNTER — Telehealth: Payer: Self-pay | Admitting: Family Medicine

## 2010-08-23 ENCOUNTER — Ambulatory Visit: Payer: Self-pay | Admitting: Family Medicine

## 2010-08-23 LAB — CONVERTED CEMR LAB: Hgb A1c MFr Bld: 9.8 %

## 2010-09-15 ENCOUNTER — Encounter (INDEPENDENT_AMBULATORY_CARE_PROVIDER_SITE_OTHER): Payer: Self-pay | Admitting: Pharmacist

## 2010-09-23 ENCOUNTER — Ambulatory Visit: Payer: Self-pay | Admitting: Family Medicine

## 2010-10-04 ENCOUNTER — Ambulatory Visit: Payer: Self-pay | Admitting: Family Medicine

## 2010-10-26 ENCOUNTER — Telehealth: Payer: Self-pay | Admitting: Family Medicine

## 2010-12-10 ENCOUNTER — Ambulatory Visit (HOSPITAL_COMMUNITY)
Admission: RE | Admit: 2010-12-10 | Discharge: 2010-12-10 | Payer: Self-pay | Source: Home / Self Care | Attending: Family Medicine | Admitting: Family Medicine

## 2010-12-14 NOTE — Assessment & Plan Note (Signed)
Summary: f/u,tcb   Vital Signs:  Patient profile:   55 year old female Height:      64.5 inches Weight:      164 pounds BMI:     27.82 Temp:     98.4 degrees F oral Pulse rate:   93 / minute BP sitting:   125 / 81  (left arm) Cuff size:   regular  Vitals Entered By: Tessie Fass CMA (December 16, 2009 11:40 AM) CC: F/U chronic issues, itchy skin Is Patient Diabetic? Yes Pain Assessment Patient in pain? no        Primary Care Provider:  Helane Rima DO  CC:  F/U chronic issues and itchy skin.  History of Present Illness: 55 yo AAF with:  1. DM: Rx metformin 1000 mg BID, Lantus 5 units daily. A1c 08/10/09 = 7.3. Today = 8.4. She usually checks BG two times a day, am is usually > 150, high = 319 before dinner. 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  2. HTN: Rx: Norvasc, HCTZ, Accupril. Controlled.  3. HLD: Rx: Crestor.  4. PMHx TIA: Rx: Plavix.   5. Pruritis: c/o itchy skin 2/2 dry, over both arms and legs, no rash or lesions, no know triggers, no allergies or exposure that she knows of.  She denies CP, SOB, N/V/D/C, HA, dizziness, numbness/tingling in hands/feet, LE edema, abdominal pain, dyspepsia, fever/chills, malaise.   Habits & Providers  Alcohol-Tobacco-Diet     Tobacco Status: never  Current Medications (verified): 1)  Glucophage 1000 Mg Tab (Metformin Hcl) .... Take 1 Tablet By Mouth Each Morning and Night 2)  Crestor 10 Mg Tabs (Rosuvastatin Calcium) .Marland Kitchen.. 1 Tablet 1 Time Per Day 3)  Lantus 100 Unit/ml  Soln (Insulin Glargine) .... 5 Units of Insulin Once Daily in The Morning 4)  Hydrochlorothiazide 25 Mg Tab (Hydrochlorothiazide) .... Take 1/2 Tablet By Mouth Once A Day 5)  Cvs Iron 325 (65 Fe) Mg  Tabs (Ferrous Sulfate) .Marland Kitchen.. 1 Tablet Three Times A Day 6)  Plavix 75 Mg Tabs (Clopidogrel Bisulfate)  .... Take 1 Tablet By Mouth Once A Day 7)  Freestyle Freedom   Strips (Blood Glucose Monitoring Suppl) .... Check Sugars Four Times A Day 8)  Lancets Fine 28g   Misc (Lancets) .... Check Sugars Twice A Day 9)  Bd Insulin Syringe Ultrafine 30g X 1/2" 0.5 Ml  Misc (Insulin Syringe-Needle U-100) .... Daily Use of Insulin 1 Box - Supply For One Month 10)  Accupril 40 Mg  Tabs (Quinapril Hcl) .... One By Mouth Daily 11)  Norvasc 10 Mg  Tabs (Amlodipine Besylate) .Marland Kitchen.. 1 Tab By Mouth Daily 12)  Hydroxyzine Hcl 50 Mg Tabs (Hydroxyzine Hcl) .... 1/2 To 1 By Mouth Q 8 Hours As Needed Itching  Allergies (verified): No Known Drug Allergies  Past History:  Past medical, surgical, family and social histories (including risk factors) reviewed for relevance to current acute and chronic problems.  Past Medical History: Reviewed history from 08/10/2009 and no changes required. Benign Murmur      - (Hochrein 6/03.  2D echo normal) PMHx gastric ulcer TIA 2008- right arm/leg numbness.       - Negative MRI/MRA and head CT.  Neuro consulted, recommended starting Plavix. DM      - Insulin HTN HLD Anemia Postmenopausal  Past Surgical History: Reviewed history  from 08/10/2009 and no changes required. BTL 1988 Colonoscopy - normal, diverticulosis - 01/13/2004 EGD - normal -12/2006 (Dr. Yancey Flemings) Endometrial Biopsy - proliferative endo. No hyperplasia or malignancy - 05/13/2003 L and R breast lumpectomies benign 1970s   Family History: Reviewed history from 10/21/2009 and no changes required. Dad - CHF, ETOH Abuse Mom - DM Sisters - HTN, DM  Social History: Reviewed history from 10/21/2009 and no changes required. 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   Physical Exam  General:  Well-developed, well-nourished, n no acute distress; alert, appropriate and cooperative throughout examination. Vitals reviewed. Lungs:  Normal respiratory effort, chest expands symmetrically. Lungs are clear to auscultation, no crackles or wheezes. Heart:  Reg Rate and Rhythm. No murmer. Extremities:  No edema. Skin:  Dry skin over both arms, no patches c/w ezcema. + excorations over both arms. No rash or lesions. Psych:  Memory intact for recent and remote, good eye contact, and flat affect.     Impression & Recommendations:  Problem # 1:  DIABETES MELLITUS II, UNCOMPLICATED (ICD-250.00) Assessment Unchanged Patient not comfortable with tirtating medication. Instructed her to increase Lantus to 7 units in am. Keep log for review at next visit. Her updated medication list for this problem includes:    Glucophage 1000 Mg Tab (Metformin hcl) .Marland Kitchen... Take 1 tablet by mouth each morning and night    Lantus 100 Unit/ml Soln (Insulin glargine) .Marland KitchenMarland KitchenMarland KitchenMarland Kitchen 5 units of insulin once daily in the morning    Accupril 40 Mg Tabs (Quinapril hcl) ..... One by mouth daily  Orders: A1C-[facility] (16109) [facility]- Est  Level 4 (60454)  Problem # 2:  HYPERTENSION, BENIGN SYSTEMIC (ICD-401.1) Assessment: Unchanged  Her updated medication list for this problem includes:    Hydrochlorothiazide 25 Mg Tab (Hydrochlorothiazide) .Marland Kitchen... Take 1/2 tablet by mouth once a day    Accupril 40 Mg Tabs (Quinapril hcl) ..... One by mouth daily    Norvasc 10 Mg Tabs (Amlodipine besylate) .Marland Kitchen... 1 tab by mouth daily  Orders: [facility]- Est  Level 4 (09811)  Problem # 3:  HYPERLIPIDEMIA (ICD-272.4) Assessment: Unchanged  Her updated medication list for this problem includes:    Crestor 10 Mg Tabs (Rosuvastatin calcium) .Marland Kitchen... 1 tablet 1 time per day  Orders: [facility]- Est  Level 4 (91478)  Problem # 4:  PRURITUS  (ICD-698.9) Assessment: New  Rx hydroxyzine.   Orders: [facility]- Est  Level 4 (29562)  Problem # 5:  CATARACTS (ICD-366.9) Assessment: Unchanged Needs referral to optho.  Complete Medication List: 1)  Glucophage 1000 Mg Tab (Metformin hcl) .... Take 1 tablet by mouth each morning and night 2)  Crestor 10 Mg Tabs (Rosuvastatin calcium) .Marland Kitchen.. 1 tablet 1 time per day 3)  Lantus 100 Unit/ml Soln (Insulin glargine) .... 5 units of insulin once daily in the morning 4)  Hydrochlorothiazide 25 Mg Tab (Hydrochlorothiazide) .... Take 1/2 tablet by mouth once a day 5)  Cvs Iron 325 (65 Fe) Mg Tabs (Ferrous sulfate) .Marland Kitchen.. 1 tablet three times a day 6)  Plavix 75 Mg Tabs (Clopidogrel bisulfate) .... Take 1 tablet by mouth once a day 7)  Freestyle Freedom Strips (blood Glucose Monitoring Suppl)  .... Check  sugars four times a day 8)  Lancets Fine 28g Misc (Lancets) .... Check sugars twice a day 9)  Bd Insulin Syringe Ultrafine 30g X 1/2" 0.5 Ml Misc (Insulin syringe-needle u-100) .... Daily use of insulin 1 box - supply for one month 10)  Accupril 40 Mg Tabs (Quinapril hcl) .... One by mouth daily 11)  Norvasc 10 Mg Tabs (Amlodipine besylate) .Marland Kitchen.. 1 tab by mouth daily 12)  Hydroxyzine Hcl 50 Mg Tabs (Hydroxyzine hcl) .... 1/2 to 1 by mouth q 8 hours as needed itching  Other Orders: Tdap => 57yrs IM (86578) Admin 1st Vaccine (46962)  Patient Instructions: 1)  It was nice to see you today! 2)  I have prescribed a medication for your itching called Hydroxyzine. It may make you sleepy, so start with 1/2 pill at night. This should be taken on an as needed basis. 3)  Increase you Lantus to 7 units. 4)  Please follow up in 1 month. Make that appointment for a FULL PHYSICAL. Make the appointment in the morning and come fasting for labwork as well. Prescriptions: HYDROXYZINE HCL 50 MG TABS (HYDROXYZINE HCL) 1/2 to 1 by mouth q 8 hours as needed itching  #60 x 0   Entered and Authorized by:   Helane Rima DO    Signed by:   Helane Rima DO on 12/16/2009   Method used:   Print then Give to Patient   RxID:   [id number]   Prevention & Chronic Care Immunizations   Influenza vaccine: Fluvax Non-MCR  (08/28/2009)   Influenza vaccine due: 10/2008    Tetanus booster: 12/16/2009: Tdap   Td booster deferral: Deferred  (08/10/2009)   Tetanus booster due: 02/12/2009    Pneumococcal vaccine: Done.  (10/14/1998)   Pneumococcal vaccine due: None  Colorectal Screening   Hemoccult: Done.  (07/15/2002)   Hemoccult due: Not Indicated    Colonoscopy: normal  (03/15/2004)   Colonoscopy action/deferral: Repeat colonoscopy in 5 years.   (02/03/2004)   Colonoscopy due: 03/2009  Other Screening   Pap smear: normal  (03/14/2006)   Pap smear action/deferral: Ordered  (08/10/2009)   Pap smear due: 03/2009    Mammogram: normal  (10/21/2008)   Mammogram due: 10/21/2009   Smoking status: never  (12/16/2009)  Diabetes Mellitus   HgbA1C: 8.4  (12/16/2009)   Hemoglobin A1C due: 05/07/2008    Eye exam: normal  (03/21/2008)   Diabetic eye exam action/deferral: Not indicated  (08/10/2009)   Eye exam due: 03/21/2009    Foot exam: yes  (05/13/2009)   High risk foot: Not documented   Foot care education: Not documented   Foot exam due: 02/04/2009    Urine microalbumin/creatinine ratio: Not documented   Urine microalbumin action/deferral: Not indicated   Urine microalbumin/cr due: 03/27/2008    Diabetes flowsheet reviewed?: Yes   Progress toward A1C goal: Deteriorated  Lipids   Total Cholesterol: 139  (02/03/2009)   LDL: 59  (02/03/2009)   LDL Direct: 62  (05/16/2007)   HDL: 60  (02/03/2009)   Triglycerides: 99  (02/03/2009)    SGOT (AST): 18  (02/03/2009)   SGPT (ALT): 8  (02/03/2009)   Alkaline phosphatase: 82  (02/03/2009)   Total bilirubin: 0.3  (02/03/2009)    Lipid flowsheet reviewed?: Yes   Progress toward LDL goal: At goal  Hypertension   Last Blood Pressure: 125 / 81   (12/16/2009)   Serum creatinine: 0.75  (02/03/2009)   Serum potassium 4.9  (02/03/2009)    Hypertension flowsheet reviewed?:  Yes   Progress toward BP goal: At goal  Self-Management Support :   Personal Goals (by the next clinic visit) :     Personal A1C goal: 7  (10/21/2009)     Personal blood pressure goal: 130/80  (08/10/2009)     Personal LDL goal: 100  (08/10/2009)    Patient will work on the following items until the next clinic visit to reach self-care goals:     Medications and monitoring: take my medicines every day  (12/16/2009)     Eating: drink diet soda or water instead of juice or soda, eat more vegetables, use fresh or frozen vegetables, eat foods that are low in salt, eat baked foods instead of fried foods, eat fruit for snacks and desserts, limit or avoid alcohol  (12/16/2009)     Activity: take a 30 minute walk every day, take the stairs instead of the elevator  (12/16/2009)    Diabetes self-management support: CBG self-monitoring log, Written self-care plan  (12/16/2009)   Diabetes care plan printed    Hypertension self-management support: Written self-care plan  (12/16/2009)   Hypertension self-care plan printed.    Lipid self-management support: Written self-care plan  (12/16/2009)   Lipid self-care plan printed.  Laboratory Results   Blood Tests   Date/Time Received: December 16, 2009 11:50 AM  Date/Time Reported: December 16, 2009 12:14 PM   HGBA1C: 8.4%   (Normal Range: Non-Diabetic - 3-6%   Control Diabetic - 6-8%)  Comments: ...............test performed by......Marland KitchenBonnie A. [place], MLS (ASCP)cm       Immunizations Administered:  Tetanus Vaccine:    Vaccine Type: Tdap    Site: left deltoid    Mfr: GlaxoSmithKline    Dose: 0.5 ml    Route: IM    Given by: Tessie Fass CMA    Exp. Date: 01/09/2012    Lot #: [id number]    VIS given: 10/02/07 version given December 16, 2009.

## 2010-12-14 NOTE — Assessment & Plan Note (Signed)
Summary: [facility]   Vital Signs:  Patient profile:   55 year old female Height:      64.5 inches Weight:      148.1 pounds BMI:     25.12  Vitals Entered By: Wyona Almas PHD (September 23, 2010 3:15 PM)  Primary Care Provider:  Helane Rima DO  CC:  Nutrition f/u.  History of Present Illness:   30 minute visit  24-hr recall suggests intake of 1200 kcal:      10 am Breakfast- 2cups of oatmeal using cinnamen, toast with butter (teaspoon butter) , 1 glass water, 1 cup cofee       12:30pm   Lunch- 1/2 [place] sandwhich- deli meat, teaspoon of mayo, noted tried a fat free mayo which was grease like she did not like, diet soda 12 once can      2 pm Snack- 6 ritz crackers , 1 tablespoon of peanut butter, diet soda      7:30pm Dinner 1 fried chicken leg,  1 cup mashed potatoes (butter milk, 1% milk), cup mixed greens ,diet soda    9:00pm Snack before bed- 9 grapes, glass of water  Concerns: Trying to increase her veggies, Difficult to get fruits because of finances,Appetite still poor, but tries to eat anyway. Her Brother moved in with her recently and this is causing more stress on her, at times she felt depressed and felt like crying, plans to move out in Jan and feels this will help. Brother has many medical problems and does not care for himself properly. Excited about the holidays as her grandchildren will be visiting.  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  Improvement in  types of carbohydrate (NI-53.3) related to veg's as evidenced by veg's consumed.  Pt has made vast improvement in her food choices with addition of veggies, change in milk to 1% and eating at regular  intervals.  Intervention: See Patient Instructions.  Given handout on Carb exchange examples  Monitoring/Eval: Dietary intake, body weight, and exercise f/u with PCP with 1 week food recall  Current Medications (verified): 1)  Glucophage 1000 Mg Tab (Metformin Hcl) .... Take 1 Tablet By Mouth Each Morning and Night 2)  Crestor 10 Mg Tabs (Rosuvastatin Calcium) .Marland Kitchen.. 1 Tablet 1 Time Per Day 3)  Novolog 100 Unit/ml Soln (Insulin Aspart) .... 2-4 Units Per Ss With Breakfast, Lunch, and Dinner 4)  Hydrochlorothiazide 25 Mg Tab (Hydrochlorothiazide) .... Take 1 Tablet By Mouth Once A Day 5)  Plavix 75 Mg Tabs (Clopidogrel Bisulfate) .... Take 1 Tablet By Mouth Once A Day 6)  Freestyle Freedom   Strips (Blood Glucose Monitoring Suppl) .... Check Sugars Four Times A Day 7)  Lancets Fine 28g   Misc (Lancets) .... Check Sugars Twice A Day 8)  Bd Insulin Syringe Ultrafine 30g X 1/2" 0.5 Ml  Misc (Insulin Syringe-Needle U-100) .... Daily Use of Insulin 1 Box - Supply For One Month 9)  Accupril 40 Mg  Tabs (Quinapril Hcl) .... One By Mouth Daily 10)  Norvasc 10 Mg  Tabs (Amlodipine Besylate) .Marland Kitchen.. 1 Tab By Mouth Daily 11)  Trazodone Hcl 50 Mg Tabs (Trazodone Hcl) .Marland Kitchen.. 1 By Mouth At Bedtime  As Needed Insomnia  Allergies (verified): No Known Drug Allergies  Physical Exam  General:  Well-developed, well-nourished, no acute distress; alert, appropriate and cooperative throughout examination. Vitals reviewed.   Impression & Recommendations:  Problem # 1:  DIABETES MELLITUS II, UNCOMPLICATED (ICD-250.00) Assessment Unchanged  Nutrition Diagnosis:  No progress on physical inactivity (NB-2.1) related to lack of motivation as evidenced by no reported structured exercise.  Improvement in  types of carbohydrate (NI-53.3) related to veg's as evidenced by veg's consumed.  Pt has made vast improvement in her food choices with addition of veggies, change in milk to 1% and eating at regular intervals.  30  minutes spent on nutrition couseling Restarted meds See HPI  Her updated medication list for this problem includes:    Glucophage 1000 Mg Tab (Metformin hcl) .Marland Kitchen... Take 1 tablet by mouth each morning and night    Novolog 100 Unit/ml Soln (Insulin aspart) .Marland Kitchen... 2-4 units per ss with breakfast, lunch, and dinner    Accupril 40 Mg Tabs (Quinapril hcl) ..... One by mouth daily  Orders: [facility]- Est  Level 4 (81191)  Problem # 2:  OBESITY (ICD-278.00) Assessment: Improved  Weight loss 2pounds in past month   Orders: [facility]- Est  Level 4 (47829)  Complete Medication List: 1)  Glucophage 1000 Mg Tab (Metformin hcl) .... Take 1 tablet by mouth each morning and night 2)  Crestor 10 Mg Tabs (Rosuvastatin calcium) .Marland Kitchen.. 1 tablet 1 time per day 3)  Novolog 100 Unit/ml Soln (Insulin aspart) .... 2-4 units per ss with breakfast, lunch, and dinner 4)  Hydrochlorothiazide 25 Mg Tab (Hydrochlorothiazide) .... Take 1 tablet by mouth once a day 5)  Plavix 75 Mg Tabs (Clopidogrel bisulfate) .... Take 1 tablet by mouth once a day 6)  Freestyle Freedom Strips (blood Glucose Monitoring Suppl)  .... Check sugars four times a day 7)  Lancets Fine 28g Misc (Lancets) .... Check sugars twice a day 8)  Bd Insulin Syringe Ultrafine 30g X 1/2" 0.5 Ml Misc (Insulin syringe-needle u-100) .... Daily use of insulin 1 box - supply for one month 9)  Accupril 40 Mg Tabs (Quinapril hcl) .... One by mouth daily 10)  Norvasc 10 Mg Tabs (Amlodipine besylate) .Marland Kitchen.. 1 tab by mouth daily 11)  Trazodone Hcl 50 Mg Tabs (Trazodone hcl) .Marland Kitchen.. 1 by mouth at bedtime as needed insomnia  Patient Instructions: 1)  Try to find the [place] that is "low fat" or "light" 2)  15 grams= 1 carbohydrate 3)  To limit carbs: 1 piece of bread, 1/2 cup potatotes, 1/2 cup of corn, 1/2 cup pasta, 1/2 rice all of these equal 1 starch each  4)  Reading labels for crackers and other boxed snacks to know how many carbohydrates 5)  Continue eating veggies with  both lunch and dinner  6)  Continue to eating 3 meals and 1-2 snacks  7)  Follow-up with Dr. Earlene Plater on Oct 04, 2010   Orders Added: 1)  [facility]- Est  Level 4 [56213]

## 2010-12-14 NOTE — Assessment & Plan Note (Signed)
Summary: f/up,tcb   Vital Signs:  Patient profile:   55 year old female Height:      64.5 inches Weight:      157 pounds BMI:     26.63 Temp:     99.1 degrees F oral Pulse rate:   100 / minute BP sitting:   146 / 80  (right arm) Cuff size:   regular  Vitals Entered By: Tessie Fass CMA (May 28, 2010 1:41 PM) CC: F/U diabetes Is Patient Diabetic? Yes Pain Assessment Patient in pain? no        Primary Care Provider:  Helane Rima DO  CC:  F/U diabetes.  History of Present Illness: 55 yo AAF with:  1. DM: Rx metformin 1000 mg BID, Lantus 7 units daily. Had hypoglycemic episode one week ago. She had ALOC x 1 week ago. EMS called. BG 22 and treated with amp D50. She states that it was because she forgot to eat (as she was so excited to see her daughter). + previous hypoglycemic episode while on Lantus 7 units. Last A1c 8.4. Patient not checking BG, states that she ran out of strips but will pick them up today.  2. HTN: Rx: Norvasc, HCTZ, Accupril. Controlled.  3. HLD: Rx: Crestor.  4. PMHx TIA: Rx: Plavix.   5. Insomnia: Able to fall alseep for only an hour or so at night. + racing thoughts. No naps during the day. Works from 5 pm to 8:30 pm.  She denies CP, SOB, N/V/D/C, HA, dizziness, numbness/tingling in hands/feet, LE edema, abdominal pain, dyspepsia, fever/chills, malaise.   Habits & Providers  Alcohol-Tobacco-Diet     Tobacco Status: never  Current Medications (verified): 1)  Glucophage 1000 Mg Tab (Metformin Hcl) .... Take 1 Tablet By Mouth Each Morning and Night 2)  Crestor 10 Mg Tabs (Rosuvastatin Calcium) .Marland Kitchen.. 1 Tablet 1 Time Per Day 3)  Lantus 100 Unit/ml  Soln (Insulin Glargine) .... 5 Units of Insulin Once Daily in The Morning 4)  Hydrochlorothiazide 25 Mg Tab (Hydrochlorothiazide) .... Take 1/2 Tablet By Mouth Once A Day 5)  Plavix 75 Mg Tabs (Clopidogrel Bisulfate) .... Take 1 Tablet By Mouth Once A Day 6)  Freestyle Freedom   Strips (Blood Glucose  Monitoring Suppl) .... Check Sugars Four Times A Day 7)  Lancets Fine 28g   Misc (Lancets) .... Check Sugars Twice A Day 8)  Bd Insulin Syringe Ultrafine 30g X 1/2" 0.5 Ml  Misc (Insulin Syringe-Needle U-100) .... Daily Use of Insulin 1 Box - Supply For One Month 9)  Accupril 40 Mg  Tabs (Quinapril Hcl) .... One By Mouth Daily 10)  Norvasc 10 Mg  Tabs (Amlodipine Besylate) .Marland Kitchen.. 1 Tab By Mouth Daily 11)  Trazodone Hcl 50 Mg Tabs (Trazodone Hcl) .Marland Kitchen.. 1 By Mouth At Bedtime As Needed Insomnia  Allergies (verified): No Known Drug Allergies PMH-FH-SH reviewed for relevance  Review of Systems      See HPI  Physical Exam  General:  Well-developed, well-nourished, n no acute distress; alert, appropriate and cooperative throughout examination. Vitals reviewed. Lungs:  Normal respiratory effort, chest expands symmetrically. Lungs are clear to auscultation, no crackles or wheezes. Heart:  Reg Rate and Rhythm. No murmer. Abdomen:  Bowel sounds positive,abdomen soft and non-tender without masses, organomegaly or hernias noted. Pulses:  2+ dp. Extremities:  No edema. Skin:  Intact without suspicious lesions or rashes. Psych:  Oriented X3, memory intact for recent and remote, normally interactive, good eye contact, not anxious appearing, and not  depressed appearing.    Diabetes Management Exam:    Foot Exam (with socks and/or shoes not present):       Sensory-Pinprick/Light touch:          Left medial foot (L-4): normal          Left dorsal foot (L-5): normal          Left lateral foot (S-1): normal          Right medial foot (L-4): normal          Right dorsal foot (L-5): normal          Right lateral foot (S-1): normal       Sensory-Monofilament:          Left foot: normal          Right foot: normal       Inspection:          Left foot: normal          Right foot: normal       Nails:          Left foot: normal          Right foot: normal   Impression & Recommendations:  Problem # 1:   DIABETES MELLITUS II, UNCOMPLICATED (ICD-250.00) Assessment Unchanged As patient has already had 2 hypoglycemic episodes on a small amount of Lantus, will have patient hold Lantus, increase Metformin, and work on managing her BG through diet and exercise. Patient is very amenable to this plan, understands that she may need insulin again. Will have patient make appt. with Dr. Gerilyn Pilgrim. Her updated medication list for this problem includes:    Glucophage 1000 Mg Tab (Metformin hcl) .Marland Kitchen... Take 1 tablet by mouth each morning and night    Lantus 100 Unit/ml Soln (Insulin glargine) .Marland KitchenMarland KitchenMarland KitchenMarland Kitchen 5 units of insulin once daily in the morning    Accupril 40 Mg Tabs (Quinapril hcl) ..... One by mouth daily  Orders: A1C-[facility] (16109) Glucose Cap-[facility] (60454) [facility]- Est  Level 4 (09811)  Problem # 2:  HYPERTENSION, BENIGN SYSTEMIC (ICD-401.1) Assessment: Improved  Her updated medication list for this problem includes:    Hydrochlorothiazide 25 Mg Tab (Hydrochlorothiazide) .Marland Kitchen... Take 1/2 tablet by mouth once a day    Accupril 40 Mg Tabs (Quinapril hcl) ..... One by mouth daily    Norvasc 10 Mg Tabs (Amlodipine besylate) .Marland Kitchen... 1 tab by mouth daily  Orders: [facility]- Est  Level 4 (91478)  Problem # 3:  HYPERLIPIDEMIA (ICD-272.4) Assessment: Unchanged  Her updated medication list for this problem includes:    Crestor 10 Mg Tabs (Rosuvastatin calcium) .Marland Kitchen... 1 tablet 1 time per day  Orders: [facility]- Est  Level 4 (29562)  Problem # 4:  INSOMNIA (ICD-780.52) Assessment: New Rx trial of Trazodone.  Problem # 5:  TIA (ICD-435.9)  Her updated medication list for this problem includes:    Plavix 75 Mg Tabs (Clopidogrel bisulfate) .Marland Kitchen... Take 1 tablet by mouth once a day  Orders: [facility]- Est  Level 4 (13086)  Complete Medication List: 1)  Glucophage 1000 Mg Tab (Metformin hcl) .... Take 1 tablet by mouth each morning and night 2)  Crestor 10 Mg Tabs (Rosuvastatin calcium) .Marland Kitchen.. 1 tablet 1 time per day 3)  Lantus 100  Unit/ml Soln (Insulin glargine) .... 5 units of insulin once daily in the morning 4)  Hydrochlorothiazide 25 Mg Tab (Hydrochlorothiazide) .... Take 1/2 tablet by mouth once a day 5)  Plavix 75 Mg Tabs (Clopidogrel bisulfate) .Marland KitchenMarland KitchenMarland Kitchen  Take 1 tablet by mouth once a day 6)  Freestyle Freedom Strips (blood Glucose Monitoring Suppl)  .... Check sugars four times a day 7)  Lancets Fine 28g Misc (Lancets) .... Check sugars twice a day 8)  Bd Insulin Syringe Ultrafine 30g X 1/2" 0.5 Ml Misc (Insulin syringe-needle u-100) .... Daily use of insulin 1 box - supply for one month 9)  Accupril 40 Mg Tabs (Quinapril hcl) .... One by mouth daily 10)  Norvasc 10 Mg Tabs (Amlodipine besylate) .Marland Kitchen.. 1 tab by mouth daily 11)  Trazodone Hcl 50 Mg Tabs (Trazodone hcl) .Marland Kitchen.. 1 by mouth at bedtime as needed insomnia  Patient Instructions: 1)  It was nice to see you today! 2)  Decrease your Lantus back to 5 units per day. 3)  Check your blood sugar two times a day. Prescriptions: TRAZODONE HCL 50 MG TABS (TRAZODONE HCL) 1 by mouth at bedtime as needed insomnia  #90 x 3   Entered and Authorized by:   Helane Rima DO   Signed by:   Helane Rima DO on 05/28/2010   Method used:   Print then Give to Patient   RxID:   [id number]   Laboratory Results   Blood Tests   Date/Time Received: May 28, 2010 1:43 PM  Date/Time Reported: May 28, 2010 2:13 PM   HGBA1C: 8.3%   (Normal Range: Non-Diabetic - 3-6%   Control Diabetic - 6-8%)  Comments: ...........test performed by...........Marland KitchenTessie Fass, CMA entered by Terese Door, CMA        Prevention & Chronic Care Immunizations   Influenza vaccine: Fluvax Non-MCR  (08/28/2009)   Influenza vaccine deferral: Not indicated  (02/22/2010)   Influenza vaccine due: 10/2008    Tetanus booster: 12/16/2009: Tdap   Td booster deferral: Deferred  (08/10/2009)   Tetanus booster due: 02/12/2009    Pneumococcal vaccine: Done.  (10/14/1998)   Pneumococcal vaccine due:  None  Colorectal Screening   Hemoccult: Done.  (07/15/2002)   Hemoccult due: Not Indicated    Colonoscopy: normal  (03/15/2004)   Colonoscopy action/deferral: Repeat colonoscopy in 5 years.   (02/03/2004)   Colonoscopy due: 03/2009  Other Screening   Pap smear: NEGATIVE FOR INTRAEPITHELIAL LESIONS OR MALIGNANCY.  (02/22/2010)   Pap smear action/deferral: Ordered  (08/10/2009)   Pap smear due: 03/2009    Mammogram: ASSESSMENT: Negative - BI-RADS 1^MM DIGITAL SCREENING  (11/17/2009)   Mammogram due: 10/21/2009   Smoking status: never  (05/28/2010)  Diabetes Mellitus   HgbA1C: 8.3  (05/28/2010)   Hemoglobin A1C due: 03/15/2010    Eye exam: normal  (03/21/2008)   Diabetic eye exam action/deferral: Not indicated  (08/10/2009)   Eye exam due: 03/21/2009    Foot exam: yes  (05/28/2010)   High risk foot: Not documented   Foot care education: Not documented   Foot exam due: 02/04/2009    Urine microalbumin/creatinine ratio: Not documented   Urine microalbumin action/deferral: Not indicated   Urine microalbumin/cr due: 03/27/2008    Diabetes flowsheet reviewed?: Yes   Progress toward A1C goal: Unchanged  Lipids   Total Cholesterol: 180  (02/22/2010)   Lipid panel action/deferral: Lipid Panel ordered   LDL: 96  (02/22/2010)   LDL Direct: 62  (05/16/2007)   HDL: 62  (02/22/2010)   Triglycerides: 109  (02/22/2010)    SGOT (AST): 11  (02/22/2010)   BMP action: Ordered   SGPT (ALT): <8 U/L  (02/22/2010)   Alkaline phosphatase: 87  (02/22/2010)   Total bilirubin: 0.4  (02/22/2010)  Lipid flowsheet reviewed?: Yes   Progress toward LDL goal: Unchanged  Hypertension   Last Blood Pressure: 146 / 80  (05/28/2010)   Serum creatinine: 0.72  (02/22/2010)   BMP action: Not indicated   Serum potassium 4.8  (02/22/2010)    Hypertension flowsheet reviewed?: Yes   Progress toward BP goal: At goal  Self-Management Support :   Personal Goals (by the next clinic visit) :      Personal A1C goal: 7  (10/21/2009)     Personal blood pressure goal: 130/80  (08/10/2009)     Personal LDL goal: 100  (08/10/2009)    Patient will work on the following items until the next clinic visit to reach self-care goals:     Medications and monitoring: take my medicines every day, bring all of my medications to every visit  (05/28/2010)     Eating: drink diet soda or water instead of juice or soda, eat more vegetables, use fresh or frozen vegetables, eat foods that are low in salt, eat baked foods instead of fried foods, eat fruit for snacks and desserts, limit or avoid alcohol  (05/28/2010)     Activity: take a 30 minute walk every day, take the stairs instead of the elevator, park at the far end of the parking lot  (05/28/2010)    Diabetes self-management support: Written self-care plan  (05/28/2010)   Diabetes care plan printed    Hypertension self-management support: Written self-care plan  (05/28/2010)   Hypertension self-care plan printed.    Lipid self-management support: Written self-care plan  (05/28/2010)   Lipid self-care plan printed.

## 2010-12-14 NOTE — Assessment & Plan Note (Signed)
Summary: nutrition,df   Vital Signs:  Patient profile:   55 year old female Height:      64.5 inches Weight:      150 pounds BMI:     25.44  Vitals Entered By: Wyona Almas PHD (August 23, 2010 3:29 PM)  Primary Care Provider:  Helane Rima DO   History of Present Illness: Assessment:  Spent 30 minutes with pt.  24-hr recall suggests intake of <1000 kcal: (up  ~7 AM) B (10 AM)- 1 c raisin bran, 1/2 c 2% milk; Snk (12 PM)- 10 grapes; D (8 PM)- 1/2 c cabbage, 1 fried chx thigh, 1/4 c mashed potatoes w/ 2% milk, butter, water.  FBG have been running  ~200.  She had a 59 one day last week, following not eating much the night before.  Shadana says this is a pretty typical day's food intake.  She also said she has not been drinking any juice recently.  It's difficult to reconcile these eating behaviors (as well as her continued weight loss) with an elevated A1C.  She has not been exercising, but as she agreed with Dr. Earlene Plater, plans to start using some exercise DVDs at home, as she doesn't feel safe walking in her neighborhood.    Nutrition Diagnosis:  No progress on physical inactivity (NB-2.1) related to lack of motivation as evidenced by no reported structured exercise.  No porogress on inappropriate intake of types of carbohydrate (NI-53.3) related to veg's as evidenced by veg's consumed yesterday limited to 1/2 cup.    Intervention: See Patient Instructions.    Monitoring/Eval: Dietary intake, body weight, and exercise at 2-wk F/U.    Allergies: No Known Drug Allergies   Complete Medication List: 1)  Glucophage 1000 Mg Tab (Metformin hcl) .... Take 1 tablet by mouth each morning and night 2)  Crestor 10 Mg Tabs (Rosuvastatin calcium) .Marland Kitchen.. 1 tablet 1 time per day 3)  Lantus 100 Unit/ml Soln (Insulin glargine) .... 5 units of insulin once daily in the morning 4)  Hydrochlorothiazide 25 Mg Tab (Hydrochlorothiazide) .... Take 1/2 tablet by mouth once a day 5)  Plavix 75 Mg Tabs  (Clopidogrel bisulfate) .... Take 1 tablet by mouth once a day 6)  Freestyle Freedom Strips (blood Glucose Monitoring Suppl)  .... Check sugars four times a day 7)  Lancets Fine 28g Misc (Lancets) .... Check sugars twice a day 8)  Bd Insulin Syringe Ultrafine 30g X 1/2" 0.5 Ml Misc (Insulin syringe-needle u-100) .... Daily use of insulin 1 box - supply for one month 9)  Accupril 40 Mg Tabs (Quinapril hcl) .... One by mouth daily 10)  Norvasc 10 Mg Tabs (Amlodipine besylate) .Marland Kitchen.. 1 tab by mouth daily 11)  Trazodone Hcl 50 Mg Tabs (Trazodone hcl) .Marland Kitchen.. 1 by mouth at bedtime as needed insomnia  Other Orders: [facility]  Patient Instructions: 1)  Try to limit saturated fat, which comes from animal fats (red meat, processed meats, cheese, whole milk, 2% milk, mayonnaise, eggs). 2)  When your current mayo runs out, choose a low-fat version (NOT fat-free, which may not taste as good as low-fat).   3)  Switch to 1% milk, and use this for cooking as well.   4)  Continue to limit starch foods to one per meal.   5)  INCLUDE AT LEAST ONE VEGETABLE WITH LUNCH AND DINNER.   6)  Your goal for vegetables should be at least 2 full cups of cooked veeg's per day (  or double that amount if they are salad veg's).   7)  On days when you don't have an appetite, try to have at least a fruit or some veg's.  8)  Breakfast cereals:  Look for at least 5 grams of fiber per serving.   9)  Suggestion:  Use the cooked version whenever possible.  Cook with cinnamon and extra water, then after it's cooked, stir in your bowl 1/4 to 1/3 cup All Bran Original.   10)  Keep a food record for a week, and bring to your next nutrition appt, in 1-3 weeks.

## 2010-12-14 NOTE — Miscellaneous (Signed)
Summary: Orders Update  Medications Added XYZAL 5 MG TABS (LEVOCETIRIZINE DIHYDROCHLORIDE) one by mouth daily       Clinical Lists Changes  Medications: Changed medication from ZYRTEC ALLERGY 10 MG TABS (CETIRIZINE HCL) take 1 tablet by mouth daily for allergies to XYZAL 5 MG TABS (LEVOCETIRIZINE DIHYDROCHLORIDE) one by mouth daily - Signed Rx of XYZAL 5 MG TABS (LEVOCETIRIZINE DIHYDROCHLORIDE) one by mouth daily;  #90 x 3;  Signed;  Entered by: Helane Rima DO;  Authorized by: Helane Rima DO;  Method used: Printed then faxed to [facility] [address] [place], [place], [place]  [address], Ph: [phone], Fax: [phone]    Prescriptions: XYZAL 5 MG TABS (LEVOCETIRIZINE DIHYDROCHLORIDE) one by mouth daily  #90 x 3   Entered and Authorized by:   Helane Rima DO   Signed by:   Helane Rima DO on 08/26/2009   Method used:   Printed then faxed to ...       [facility] [address] (retail)       [address]       [facility]       [place], [place]  [address]       Ph: [phone]       Fax: [phone]   RxID:   [id number]

## 2010-12-14 NOTE — Assessment & Plan Note (Signed)
Summary: f/u,df   Vital Signs:  Patient profile:   55 year old female Height:      64.5 inches Weight:      150.31 pounds BMI:     25.49 BSA:     1.74 Temp:     99.1 degrees F Pulse rate:   86 / minute BP sitting:   140 / 85  Vitals Entered By: Jone Baseman CMA (August 23, 2010 2:58 PM) CC: f/u DM, HTN, HLD Is Patient Diabetic? Yes Did you bring your meter with you today? No Pain Assessment Patient in pain? no        Primary Care Provider:  Helane Rima DO  CC:  f/u DM, HTN, and HLD.  History of Present Illness: 55 yo F:  1. DM: Rx metformin 1000 mg in am and 1500 mg in pm, Hx of hypoglycemic episode (while on Lantus 7 units) with LOC and EMS called. BG 22 and treated with amp D50. She states that it was because she forgot to eat (as she was so excited to see her daughter). Last A1c 8.4. Patient checking BG daily in am. High 200. Low 59. Usually 150s-170s. A1c increased today to 9.8. Met with nutritionist last week and has another appointment today. Endorses weight loss 2/2 decreased appetite 2/2 the smell of her brother's LE wound in her house.  2. HTN: Rx: Norvasc, HCTZ, Accupril. Borderline high at a few visits. Does not check at home.  3. HLD: Rx: Crestor. Denies myalgias.  She denies CP, SOB, N/V/D/C, HA, dizziness, numbness/tingling in hands/feet, LE edema, abdominal pain, dyspepsia, fever/chills, malaise.   Habits & Providers  Alcohol-Tobacco-Diet     Tobacco Status: never  Current Medications (verified): 1)  Glucophage 1000 Mg Tab (Metformin Hcl) .... Take 1 Tablet By Mouth Each Morning and Night 2)  Crestor 10 Mg Tabs (Rosuvastatin Calcium) .Marland Kitchen.. 1 Tablet 1 Time Per Day 3)  Novolog 100 Unit/ml Soln (Insulin Aspart) .... 2-4 Units Per Ss With Breakfast, Lunch, and Dinner 4)  Hydrochlorothiazide 25 Mg Tab (Hydrochlorothiazide) .... Take 1 Tablet By Mouth Once A Day 5)  Plavix 75 Mg Tabs (Clopidogrel Bisulfate) .... Take 1 Tablet By Mouth Once A Day 6)   Freestyle Freedom   Strips (Blood Glucose Monitoring Suppl) .... Check Sugars Four Times A Day 7)  Lancets Fine 28g   Misc (Lancets) .... Check Sugars Twice A Day 8)  Bd Insulin Syringe Ultrafine 30g X 1/2" 0.5 Ml  Misc (Insulin Syringe-Needle U-100) .... Daily Use of Insulin 1 Box - Supply For One Month 9)  Accupril 40 Mg  Tabs (Quinapril Hcl) .... One By Mouth Daily 10)  Norvasc 10 Mg  Tabs (Amlodipine Besylate) .Marland Kitchen.. 1 Tab By Mouth Daily 11)  Trazodone Hcl 50 Mg Tabs (Trazodone Hcl) .Marland Kitchen.. 1 By Mouth At Bedtime As Needed Insomnia  Allergies (verified): No Known Drug Allergies  Past History:  Past Medical History: Benign Murmur      - Hochrein 6/03. 2D echo normal PMHx gastric ulcer TIA 2008- right arm/leg numbness.       - Negative MRI/MRA and head CT.  Neuro consulted, recommended starting Plavix. DM      - Two significant hypoglycemic episodes on Lantus (7 units) HTN HLD Anemia Postmenopausal PMH-FH-SH reviewed for relevance  Review of Systems      See HPI  Physical Exam  General:  Well-developed, well-nourished, n no acute distress; alert, appropriate and cooperative throughout examination. Vitals reviewed. Lungs:  Normal respiratory effort, chest expands  symmetrically. Lungs are clear to auscultation, no crackles or wheezes. Heart:  Reg Rate and Rhythm. No murmur. Pulses:  2+ DP. Extremities:  No edema.  Diabetes Management Exam:    Foot Exam (with socks and/or shoes not present):       Sensory-Pinprick/Light touch:          Left medial foot (L-4): normal          Left dorsal foot (L-5): normal          Left lateral foot (S-1): normal          Right medial foot (L-4): normal          Right dorsal foot (L-5): normal          Right lateral foot (S-1): normal       Sensory-Monofilament:          Left foot: normal          Right foot: normal       Inspection:          Left foot: normal          Right foot: normal       Nails:          Left foot: normal           Right foot: normal   Impression & Recommendations:  Problem # 1:  DIABETES MELLITUS II, UNCOMPLICATED (ICD-250.00) Assessment Deteriorated  A1c up since stopping Lantus. Will Rx Novolog three times a day with meals. Discussed Novolog 2 units with each meal and adding 1 unit for each extra carbohydrate up to 4 units. Check BG QID for [place] o review at this next visit. Dr. Gerilyn Pilgrim will review carbohydrate choices with the patient during her nutrition visit today. Her updated medication list for this problem includes:    Glucophage 1000 Mg Tab (Metformin hcl) .Marland Kitchen... Take 1 tablet by mouth each morning and night    Novolog 100 Unit/ml Soln (Insulin aspart) .Marland Kitchen... 2-4 units per ss with breakfast, lunch, and dinner    Accupril 40 Mg Tabs (Quinapril hcl) ..... One by mouth daily  Orders: A1C-[facility] (98119) [facility]- Est  Level 4 (14782)  Problem # 2:  HYPERTENSION, BENIGN SYSTEMIC (ICD-401.1) Assessment: Unchanged  Increase HCTZ today. Her updated medication list for this problem includes:    Hydrochlorothiazide 25 Mg Tab (Hydrochlorothiazide) .Marland Kitchen... Take 1 tablet by mouth once a day    Accupril 40 Mg Tabs (Quinapril hcl) ..... One by mouth daily    Norvasc 10 Mg Tabs (Amlodipine besylate) .Marland Kitchen... 1 tab by mouth daily  Orders: [facility]- Est  Level 4 (95621)  Problem # 3:  HYPERLIPIDEMIA (ICD-272.4) Assessment: Unchanged  Her updated medication list for this problem includes:    Crestor 10 Mg Tabs (Rosuvastatin calcium) .Marland Kitchen... 1 tablet 1 time per day  Orders: [facility]- Est  Level 4 (30865)  Complete Medication List: 1)  Glucophage 1000 Mg Tab (Metformin hcl) .... Take 1 tablet by mouth each morning and night 2)  Crestor 10 Mg Tabs (Rosuvastatin calcium) .Marland Kitchen.. 1 tablet 1 time per day 3)  Novolog 100 Unit/ml Soln (Insulin aspart) .... 2-4 units per ss with breakfast, lunch, and dinner 4)  Hydrochlorothiazide 25 Mg Tab (Hydrochlorothiazide) .... Take 1 tablet by mouth once a day 5)  Plavix 75 Mg Tabs (Clopidogrel  bisulfate) .... Take 1 tablet by mouth once a day 6)  Freestyle Freedom Strips (blood Glucose Monitoring Suppl)  .... Check sugars four times a day 7)  Lancets Fine 28g Misc (Lancets) .... Check sugars twice a day 8)  Bd Insulin Syringe Ultrafine 30g X 1/2" 0.5 Ml Misc (Insulin syringe-needle u-100) .... Daily use of insulin 1 box - supply for one month 9)  Accupril 40 Mg Tabs (Quinapril hcl) .... One by mouth daily 10)  Norvasc 10 Mg Tabs (Amlodipine besylate) .Marland Kitchen.. 1 tab by mouth daily 11)  Trazodone Hcl 50 Mg Tabs (Trazodone hcl) .Marland Kitchen.. 1 by mouth at bedtime as needed insomnia  Other Orders: Flu Vaccine 29yrs + [phone]) Admin 1st Vaccine (60454) Admin 1st Vaccine [facility]) [phone])  Patient Instructions: 1)  It was nice to see you today! 2)  Start Novolog 2 units with breakfast, lunch, and dinner. You may add one unit for every extra carbohydrate that you eat at each meal. Check your blood sugars four times a day. Follow up in 1-2 weeks so that we can go over them. 3)  Increase you HCTZ to one whole pill daily. 4)  We are giving you a flu shot today. Prescriptions: HYDROCHLOROTHIAZIDE 25 MG TAB (HYDROCHLOROTHIAZIDE) Take 1 tablet by mouth once a day  #0 x 0   Entered and Authorized by:   Helane Rima DO   Signed by:   Helane Rima DO on 08/23/2010   Method used:   Print then Give to Patient   RxID:   [id number] BD INSULIN SYRINGE ULTRAFINE 30G X 1/2" 0.5 ML  MISC (INSULIN SYRINGE-NEEDLE U-100) daily use of insulin 1 box - supply for one month  #1 x 11   Entered and Authorized by:   Helane Rima DO   Signed by:   Helane Rima DO on 08/23/2010   Method used:   Print then Give to Patient   RxID:   [id number] LANCETS FINE 28G   MISC (LANCETS) check sugars twice a day  #100 x 12   Entered and Authorized by:   Helane Rima DO   Signed by:   Helane Rima DO on 08/23/2010   Method used:   Print then Give to Patient   RxID:   [id number] FREESTYLE FREEDOM   STRIPS (BLOOD  GLUCOSE MONITORING SUPPL) Check sugars four times a day  #120 x 3   Entered and Authorized by:   Helane Rima DO   Signed by:   Helane Rima DO on 08/23/2010   Method used:   Print then Give to Patient   RxID:   [id number] NOVOLOG 100 UNIT/ML SOLN (INSULIN ASPART) 2-4 units per SS with breakfast, lunch, and dinner  #1 x 3   Entered and Authorized by:   Helane Rima DO   Signed by:   Helane Rima DO on 08/23/2010   Method used:   Print then Give to Patient   RxID:   [id number]   Prevention & Chronic Care Immunizations   Influenza vaccine: Fluvax 3+  (08/23/2010)   Influenza vaccine deferral: Not indicated  (02/22/2010)   Influenza vaccine due: 08/15/2011    Tetanus booster: 12/16/2009: Tdap   Td booster deferral: Not indicated  (08/23/2010)   Tetanus booster due: 12/16/2019    Pneumococcal vaccine: Done.  (10/14/1998)   Pneumococcal vaccine deferral: Not indicated  (08/23/2010)   Pneumococcal vaccine due: None  Colorectal Screening   Hemoccult: Done.  (07/15/2002)   Hemoccult due: Not Indicated    Colonoscopy: normal  (03/15/2004)   Colonoscopy action/deferral: Repeat colonoscopy in 5 years.   (02/03/2004)   Colonoscopy due: 03/2009  Other Screening   Pap smear: NEGATIVE FOR INTRAEPITHELIAL LESIONS  OR MALIGNANCY.  (02/22/2010)   Pap smear action/deferral: Ordered  (08/10/2009)   Pap smear due: 03/2009    Mammogram: ASSESSMENT: Negative - BI-RADS 1^MM DIGITAL SCREENING  (11/17/2009)   Mammogram due: 10/21/2009   Smoking status: never  (08/23/2010)  Diabetes Mellitus   HgbA1C: 9.8  (08/23/2010)   Hemoglobin A1C due: 03/15/2010    Eye exam: normal  (03/21/2008)   Diabetic eye exam action/deferral: Not indicated  (08/10/2009)   Eye exam due: 03/21/2009    Foot exam: yes  (08/23/2010)   High risk foot: Not documented   Foot care education: Not documented   Foot exam due: 02/04/2009    Urine microalbumin/creatinine ratio: Not documented   Urine  microalbumin action/deferral: Not indicated   Urine microalbumin/cr due: 03/27/2008    Diabetes flowsheet reviewed?: Yes   Progress toward A1C goal: Deteriorated  Lipids   Total Cholesterol: 180  (02/22/2010)   Lipid panel action/deferral: Lipid Panel ordered   LDL: 96  (02/22/2010)   LDL Direct: 62  (05/16/2007)   HDL: 62  (02/22/2010)   Triglycerides: 109  (02/22/2010)    SGOT (AST): 11  (02/22/2010)   BMP action: Ordered   SGPT (ALT): <8 U/L  (02/22/2010)   Alkaline phosphatase: 87  (02/22/2010)   Total bilirubin: 0.4  (02/22/2010)    Lipid flowsheet reviewed?: Yes   Progress toward LDL goal: At goal  Hypertension   Last Blood Pressure: 140 / 85  (08/23/2010)   Serum creatinine: 0.72  (02/22/2010)   BMP action: Not indicated   Serum potassium 4.8  (02/22/2010)    Hypertension flowsheet reviewed?: Yes   Progress toward BP goal: Unchanged  Self-Management Support :   Personal Goals (by the next clinic visit) :     Personal A1C goal: 8  (08/23/2010)     Personal blood pressure goal: 130/80  (08/10/2009)     Personal LDL goal: 100  (08/10/2009)    Patient will work on the following items until the next clinic visit to reach self-care goals:     Medications and monitoring: take my medicines every day, bring all of my medications to every visit  (08/23/2010)     Eating: drink diet soda or water instead of juice or soda, eat more vegetables, use fresh or frozen vegetables, eat foods that are low in salt, eat baked foods instead of fried foods, eat fruit for snacks and desserts, limit or avoid alcohol  (08/23/2010)     Activity: take a 30 minute walk every day, take the stairs instead of the elevator, park at the far end of the parking lot  (08/23/2010)    Diabetes self-management support: Written self-care plan  (08/23/2010)   Diabetes care plan printed    Hypertension self-management support: Written self-care plan  (08/23/2010)   Hypertension self-care plan printed.     Lipid self-management support: Written self-care plan  (08/23/2010)   Lipid self-care plan printed.   Nursing Instructions: Give Flu vaccine today   Laboratory Results   Blood Tests   Date/Time Received: August 23, 2010 2:53 PM  Date/Time Reported: August 23, 2010 3:14 PM   HGBA1C: 9.8%   (Normal Range: Non-Diabetic - 3-6%   Control Diabetic - 6-8%)  Comments: ...........test performed by...........Marland KitchenTerese Door, CMA       Influenza Vaccine    Vaccine Type: Fluvax 3+    Site: right deltoid    Mfr: [employer]oSmithK[employer]    Dose: 0.5 ml    Route: IM    Given by:  Theresia Lo RN    Exp. Date: 05/11/2011    Lot #: [id number]    VIS given: 06/08/10 version given August 23, 2010.  Flu Vaccine Consent Questions    Do you have a history of severe allergic reactions to this vaccine? no    Any prior history of allergic reactions to egg and/or gelatin? no    Do you have a sensitivity to the preservative Thimersol? no    Do you have a past history of Guillan-Barre Syndrome? no    Do you currently have an acute febrile illness? no    Have you ever had a severe reaction to latex? no    Vaccine information given and explained to patient? yes    Are you currently pregnant? no

## 2010-12-14 NOTE — Miscellaneous (Signed)
Summary: Orders Update  Clinical Lists Changes  Problems: Added new problem of ENCOUNTER FOR LONG-TERM USE OF OTHER MEDICATIONS (ICD-V58.69) Orders: Added new Test order of B12-[facility] ([id number]) - Signed Added new Test order of CBC-[facility] (85027) - Signed  Ok per Dr. Wallace 

## 2010-12-14 NOTE — Assessment & Plan Note (Signed)
Summary: F/U LAST VISIT/EO   Vital Signs:  Patient profile:   55 year old female Height:      64.5 inches Weight:      169 pounds BMI:     28.66 Temp:     99 degrees F Pulse rate:   80 / minute BP sitting:   132 / 80  (left arm) Cuff size:   regular  Vitals Entered By: Dennison Nancy RN (August 10, 2009 10:34 AM) CC: Follow up DM Is Patient Diabetic? Yes  Pain Assessment Patient in pain? no        Primary Care Provider:  Helane Rima DO  CC:  Follow up DM.  History of Present Illness: 55 yo AAF with:  1. DM: Rx metformin 1000 mg BID, Lantus 10 units in am, Novolog 5 units before dinner (she has not started this yet). A1c today 7.3 (improved from 8.4 on 05/13/09). She checks BG two times a day. AM BG mostly 70-120 (lowest = 52 with no symptoms), PM BG taken prior to dinner and usually 150-250. She has been "watching her diet" and walking everywhere. Weight stable from last visit.   2. HTN: Rx: Norvasc, HCTZ, Accupril (she has not been taking this). Today = 132/80.   3. HLD: Rx: Crestor.  4. PMHx TIA: Rx: Plavix.   5. Anemia: previous workup by Dr. Marina Goodell with negative EGD and colonoscopy. 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 Patient without menses x 1 year. Denies BRBPR, melena, hematemesis.  She denies CP, SOB, N/V/D/C, HA, dizziness, numbness/tingling in hands/feet, LE edema, abdominal pain, dyspepsia, fever/chills, malaise.  Habits & Providers  Alcohol-Tobacco-Diet     Tobacco Status: never  Current Medications (verified): 1)  Glucophage 1000 Mg Tab (Metformin Hcl) .... Take 1 Tablet By Mouth Each Morning and Night 2)  Crestor 10 Mg Tabs (Rosuvastatin Calcium) .Marland Kitchen.. 1 Tablet 1 Time Per Day 3)  Lantus 100 Unit/ml  Soln (Insulin Glargine) .Marland Kitchen.. 10 Units of Insulin Once Daily in The Morning 4)  Hydrochlorothiazide 25 Mg Tab (Hydrochlorothiazide) .... Take 1/2 Tablet By Mouth Once A Day 5)  Cvs Iron 325 (65 Fe) Mg  Tabs (Ferrous Sulfate)  .Marland Kitchen.. 1 Tablet Three Times A Day 6)  Plavix 75 Mg Tabs (Clopidogrel Bisulfate) .... Take 1 Tablet By Mouth Once A Day 7)  Freestyle Freedom   Strips (Blood Glucose Monitoring Suppl) .... Check Sugars Twice A Day 8)  Lancets Fine 28g   Misc (Lancets) .... Check Sugars Twice A Day 9)  Bd Insulin Syringe Ultrafine 30g X 1/2" 0.5 Ml  Misc (Insulin Syringe-Needle U-100) .... Daily Use of Insulin 1 Box - Supply For One Month 10)  Accupril 40 Mg  Tabs (Quinapril Hcl) .... One By Mouth Daily 11)  Norvasc 10 Mg  Tabs (Amlodipine Besylate) .Marland Kitchen.. 1 Tab By Mouth Daily 12)  Zyrtec Allergy 10 Mg Tabs (Cetirizine Hcl) .... Take 1 Tablet By Mouth Daily For Allergies 13)  Novolog 100 Unit/ml Soln (Insulin Aspart) .... Inject 5 Units Prior To Dinner If Pre-Meal Sugar Is More Than 200.  Give 1 Month Supply  Allergies (verified): No Known Drug Allergies  Past History:  Past Medical History: Benign Murmur      - (Hochrein 6/03.  2D echo normal) PMHx gastric ulcer TIA 2008- right arm/leg numbness.       - Negative MRI/MRA and head CT.  Neuro consulted, recommended starting Plavix. DM      - Insulin HTN HLD Anemia Postmenopausal  Past Surgical History: BTL 1988 Colonoscopy - normal, diverticulosis - 01/13/2004 EGD - normal -12/2006 (Dr. Yancey Flemings) Endometrial Biopsy - proliferative endo. 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  Review of Systems       per HPI, otherwise negative  Physical Exam  General:  Well-developed, well-nourished, in no acute distress; alert, appropriate and cooperative throughout examination. Vitals reviewed. Neck:  No deformities, masses, or tenderness noted. Lungs:  Normal respiratory effort, chest expands symmetrically. Lungs are clear  to auscultation, no crackles or wheezes. Heart:  Normal rate and regular rhythm. S1 and S2 normal without gallop, murmur, click, rub or other extra sounds. Abdomen:  Bowel sounds positive,abdomen soft and non-tender without masses, organomegaly or hernias noted. Pulses:  2+ dp. Extremities:  No edema. Neurologic:  No focal deficits. Psych:  Oriented X3, memory intact for recent and remote, normally interactive, good eye contact, and not anxious appearing.     Impression & Recommendations:  Problem # 1:  DIABETES MELLITUS II, UNCOMPLICATED (ICD-250.00) Assessment Improved Continue current treatment. Start Novolog as previously prescribed. Her updated medication list for this problem includes:    Glucophage 1000 Mg Tab (Metformin hcl) .Marland Kitchen... Take 1 tablet by mouth each morning and night    Lantus 100 Unit/ml Soln (Insulin glargine) .Marland KitchenMarland KitchenMarland KitchenMarland Kitchen 10 units of insulin once daily in the morning    Accupril 40 Mg Tabs (Quinapril hcl) ..... One by mouth daily    Novolog 100 Unit/ml Soln (Insulin aspart) ..... Inject 5 units prior to dinner if pre-meal sugar is more than 200.  give 1 month supply  Orders: A1C-[facility] ([address]109) [facility]- Est  Level 4 (60454)  Problem # 2:  HYPERTENSION, BENIGN SYSTEMIC (ICD-401.1) Assessment: Unchanged  Restart Accupril but at half previous dose since BP near goal today. Will monitor. Her updated medication list for this problem includes:    Hydrochlorothiazide 25 Mg Tab (Hydrochlorothiazide) .Marland Kitchen... Take 1/2 tablet by mouth once a day    Accupril 40 Mg Tabs (Quinapril hcl) ..... One by mouth daily    Norvasc 10 Mg Tabs (Amlodipine besylate) .Marland Kitchen... 1 tab by mouth daily  Orders: [facility]- Est  Level 4 (09811)  Problem # 3:  HYPERLIPIDEMIA (ICD-272.4)  Her updated medication list for this problem includes:    Crestor 10 Mg Tabs (Rosuvastatin calcium) .Marland Kitchen... 1 tablet 1 time per day  Orders: [facility]- Est  Level 4 (91478)  Problem # 4:  TIA (ICD-435.9)  Her updated medication list for  this problem includes:    Plavix 75 Mg Tabs (Clopidogrel bisulfate) .Marland Kitchen... Take 1 tablet by mouth once a day  Orders: [facility]- Est  Level 4 (29562)  Problem # 5:  ANEMIA, IRON DEFICIENCY, UNSPEC. (ICD-280.9) Assessment: Improved  Anemia was thought to be due to uterine bleeding, patient has not had menses in over 1 year, and MCV now normal. Okay for patient to stop iron supplements. Will recheck in 3 months.  Her updated medication list for this problem includes:    Cvs Iron 325 (65 Fe) Mg Tabs (Ferrous sulfate) .Marland Kitchen... 1 tablet three times a day  Orders: [facility]- Est  Level 4 (99214)  Problem # 6:  POSTMENOPAUSAL WITHOUT HORMONE REPLACEMENT THERAPY (ICD-V49.81) Assessment: Unchanged  Treat symptomatically.  Orders: [facility]- Est  Level 4 (13086)  Problem # 7:  Preventive Health Care (ICD-V70.0) Patient due for: tetanus, PAP, (mammogram in December).  Complete Medication List: 1)  Glucophage 1000 Mg Tab (Metformin hcl) .... Take 1 tablet by mouth each morning and night 2)  Crestor 10 Mg Tabs (Rosuvastatin calcium) .Marland Kitchen.. 1 tablet 1 time per day 3)  Lantus 100 Unit/ml Soln (Insulin glargine) .Marland Kitchen.. 10 units of insulin once daily in the morning 4)  Hydrochlorothiazide 25 Mg Tab (Hydrochlorothiazide) .... Take 1/2 tablet by mouth once a day 5)  Cvs Iron 325 (65 Fe) Mg Tabs (Ferrous sulfate) .Marland Kitchen.. 1 tablet three times a day 6)  Plavix 75 Mg Tabs (Clopidogrel bisulfate) .... Take 1 tablet by mouth once a day 7)  Freestyle Freedom Strips (blood Glucose Monitoring Suppl)  .... Check sugars twice a day 8)  Lancets Fine 28g Misc (Lancets) .... Check sugars twice a day 9)  Bd Insulin Syringe Ultrafine 30g X 1/2" 0.5 Ml Misc (Insulin syringe-needle u-100) .... Daily use of insulin 1 box - supply for one month 10)  Accupril 40 Mg Tabs (Quinapril hcl) .... One by mouth daily 11)  Norvasc 10 Mg Tabs (Amlodipine besylate) .Marland Kitchen.. 1 tab by mouth daily 12)  Zyrtec Allergy 10 Mg Tabs (Cetirizine hcl) .... Take 1  tablet by mouth daily for allergies 13)  Novolog 100 Unit/ml Soln (Insulin aspart) .... Inject 5 units prior to dinner if pre-meal sugar is more than 200.  give 1 month supply  Patient Instructions: 1)  It was nice to meet you today! 2)  [name] = 7.3 (Good Job!) 3)  Follow up in 3 months or sooner if you need me. Schedule your next visit as a FULL PHYSICAL. 4)  [facility] 5)  Please check you blood pressure at home. If it is below 110/70, let me know.  Prescriptions: ACCUPRIL 40 MG  TABS (QUINAPRIL HCL) one by mouth daily  #30 x 6   Entered and Authorized by:   Helane Rima MD   Signed by:   Helane Rima MD on 08/10/2009   Method used:   Printed then faxed to ...       [facility] [address] (retail)       [address]       [facility]       [place], [place]  [address]       Ph: [phone]       Fax: [phone]   RxID:   [id number] NOVOLOG 100 UNIT/ML SOLN (INSULIN ASPART) inject 5 units prior to dinner if pre-meal sugar is more than 200.  Give 1 month supply  #1 x 6   Entered and Authorized by:   Helane Rima MD   Signed by:   Helane Rima MD on 08/10/2009   Method used:   Printed then faxed to ...       [facility] [address] (retail)       [address]       [facility]       [place], [place]  [address]       Ph: [phone]       Fax: [phone]   RxID:   [id number] ZYRTEC ALLERGY 10 MG TABS (CETIRIZINE HCL) take 1 tablet by mouth daily for allergies  #30 x 6   Entered and Authorized by:   Helane Rima MD   Signed by:   Alcario Drought  Earlene Plater MD on 08/10/2009   Method used:   Printed then faxed to ...       [facility] [address] (retail)       [address]       [facility]       [place], [place]  [address]       Ph: [phone]       Fax: [phone]   RxID:   [id number] NORVASC 10 MG  TABS (AMLODIPINE BESYLATE) 1 tab by mouth daily  #30 x 4    Entered and Authorized by:   Helane Rima MD   Signed by:   Helane Rima MD on 08/10/2009   Method used:   Printed then faxed to ...       [facility] [address] (retail)       [address]       [facility]       [place], [place]  [address]       Ph: [phone]       Fax: [phone]   RxID:   [id number] BD INSULIN SYRINGE ULTRAFINE 30G X 1/2" 0.5 ML  MISC (INSULIN SYRINGE-NEEDLE U-100) daily use of insulin 1 box - supply for one month  #1 x 11   Entered and Authorized by:   Helane Rima MD   Signed by:   Helane Rima MD on 08/10/2009   Method used:   Printed then faxed to ...       [facility] [address] (retail)       [address]       [facility]       [place], [place]  [address]       Ph: [phone]       Fax: [phone]   RxID:   [id number] LANCETS FINE 28G   MISC (LANCETS) check sugars twice a day  #100 x 12   Entered and Authorized by:   Helane Rima MD   Signed by:   Helane Rima MD on 08/10/2009   Method used:   Printed then faxed to ...       [facility] [address] (retail)       [address]       [facility]       [place], [place]  [address]       Ph: [phone]       Fax: [phone]   RxID:   [id number] FREESTYLE FREEDOM   STRIPS (BLOOD GLUCOSE MONITORING SUPPL) Check sugars twice a day  #100 x 12   Entered and Authorized by:   Helane Rima MD   Signed by:   Helane Rima MD on 08/10/2009   Method used:   Printed then faxed to ...       [facility] [address] (retail)       [address]       [facility]       [place], [place]  [address]       Ph: [phone]       Fax: [phone]   RxID:   [id number] PLAVIX 75 MG TABS (CLOPIDOGREL BISULFATE) Take 1 tablet by mouth once a day  #30 x 6   Entered and Authorized by:   Helane Rima MD   Signed by:   Helane Rima MD on 08/10/2009   Method  used:   Printed then faxed to ...       [facility] [address] (retail)       [address]       [facility]       [place], [place]  [address]       Ph: [phone]       Fax: [phone]   RxID:   [id number] HYDROCHLOROTHIAZIDE 25 MG TAB (HYDROCHLOROTHIAZIDE) Take 1/2 tablet by mouth once a day  #30 x 6   Entered and Authorized by:   Helane Rima MD   Signed by:   Helane Rima MD on 08/10/2009   Method used:   Printed then faxed to ...       [facility] [address] (retail)       [address]       [facility]       [place], [place]  [address]       Ph: [phone]       Fax: [phone]   RxID:   [id number] LANTUS 100 UNIT/ML  SOLN (INSULIN GLARGINE) 10 units of insulin once daily in the morning  #3 boxes x 3   Entered and Authorized by:   Helane Rima MD   Signed by:   Helane Rima MD on 08/10/2009   Method used:   Printed then faxed to ...       [facility] [address] (retail)       [address]       [facility]       [place], [place]  [address]       Ph: [phone]       Fax: [phone]   RxID:   [id number] CRESTOR 10 MG TABS (ROSUVASTATIN CALCIUM) 1 tablet 1 time per day  #90 x 3   Entered and Authorized by:   Helane Rima MD   Signed by:   Helane Rima MD on 08/10/2009   Method used:   Printed then faxed to ...       [facility] [address] (retail)       [address]       [facility]       [place], [place]  [address]       Ph: [phone]       Fax: [phone]   RxID:   [id number] GLUCOPHAGE 1000 MG TAB (METFORMIN HCL) Take 1 tablet by mouth each morning and night  #60 x 6   Entered and Authorized by:   Helane Rima MD   Signed by:   Helane Rima MD on 08/10/2009   Method used:   Printed then faxed to ...       [facility] [address] (retail)       [address]       [facility]       [place], [place]  [address]       Ph: [phone]       Fax: [phone]   RxID:    [id number]   Prevention & Chronic Care Immunizations   Influenza vaccine: FLUARIX  (08/06/2008)   Influenza vaccine due: 10/2008    Tetanus booster: 02/13/1999: Done.   Td booster deferral: Deferred  (08/10/2009)   Tetanus booster due: 02/12/2009    Pneumococcal vaccine: Done.  (10/14/1998)   Pneumococcal vaccine due: None  Colorectal Screening   Hemoccult: Done.  (07/15/2002)   Hemoccult due: Not Indicated    Colonoscopy: normal  (03/15/2004)   Colonoscopy action/deferral: Repeat colonoscopy in 5 years.   (02/03/2004)   Colonoscopy due: 03/2009  Other Screening   Pap smear: normal  (03/14/2006)   Pap smear action/deferral: Ordered  (08/10/2009)   Pap smear due: 03/2009    Mammogram: normal  (10/21/2008)   Mammogram due: 10/21/2009   Smoking status: never  (08/10/2009)  Diabetes Mellitus   HgbA1C: 7.3  (08/10/2009)   Hemoglobin A1C due: 05/07/2008    Eye exam: normal  (03/21/2008)   Diabetic eye exam action/deferral: Not indicated  (08/10/2009)   Eye exam due: 03/21/2009    Foot exam: yes  (05/13/2009)   High risk foot: Not documented   Foot care education: Not documented   Foot exam due: 02/04/2009    Urine microalbumin/creatinine ratio: Not documented   Urine microalbumin/cr due: 03/27/2008    Diabetes flowsheet reviewed?: Yes   Progress toward A1C goal: Improved  Lipids   Total Cholesterol: 139  (02/03/2009)   LDL: 59  (02/03/2009)   LDL Direct: 62  (05/16/2007)   HDL: 60  (02/03/2009)   Triglycerides: 99  (02/03/2009)    SGOT (AST): 18  (02/03/2009)   SGPT (ALT): 8  (02/03/2009)   Alkaline phosphatase: 82  (02/03/2009)   Total bilirubin: 0.3  (02/03/2009)    Lipid flowsheet reviewed?: Yes   Progress toward LDL goal: At goal  Hypertension   Last Blood Pressure: 132 / 80  (08/10/2009)   Serum creatinine: 0.75  (02/03/2009)   Serum potassium 4.9  (02/03/2009)    Hypertension flowsheet reviewed?: Yes   Progress toward BP goal: At  goal  Self-Management Support :   Personal Goals (by the next clinic visit) :     Personal A1C goal: 6  (08/10/2009)     Personal blood pressure goal: 130/80  (08/10/2009)     Personal LDL goal: 100  (08/10/2009)    Patient will work on the following items until the next clinic visit to reach self-care goals:     Medications and monitoring: take my medicines every day  (08/10/2009)     Activity: take a 30 minute walk every day  (08/10/2009)    Diabetes self-management support: Written self-care plan  (08/10/2009)   Diabetes care plan printed    Hypertension self-management support: Written self-care plan  (08/10/2009)   Hypertension self-care plan printed.    Lipid self-management support: Written self-care plan  (08/10/2009)   Lipid self-care plan printed.  Laboratory Results   Blood Tests   Date/Time Received: August 10, 2009 10:30 AM  Date/Time Reported: August 10, 2009 10:40 AM   HGBA1C: 7.3%   (Normal Range: Non-Diabetic - 3-6%   Control Diabetic - 6-8%)  Comments: ............test performed by...........Marland KitchenTerese Door, CMA

## 2010-12-14 NOTE — Assessment & Plan Note (Signed)
Summary: F/U  [name]   Vital Signs:  Patient profile:   55 year old female Height:      64.5 inches Weight:      146 pounds BMI:     24.76 Temp:     98.7 degrees F oral Pulse rate:   111 / minute BP sitting:   103 / 68  (left arm) Cuff size:   regular  Vitals Entered By: Tessie Fass CMA (October 04, 2010 2:58 PM) CC: F/U DM, HTN, HLD Is Patient Diabetic? Yes Pain Assessment Patient in pain? no        Primary Care Provider:  Helane Rima DO  CC:  F/U DM, HTN, and HLD.  History of Present Illness: 55 yo F:  1. DM: Metformin and Novolog. Was on Lantus but had 2 episodes of hypoglycemia. Endorses improvement in BG with no lows on Novolog 2 units with each meal, adding 1 unit for each extra carbohydrate. She is also followed by Nutrition for education. BG range in am: 74-187, after meal: 144-218.  2. HTN: Rx: Norvasc, HCTZ, Accupril.   3. HLD: Rx: Crestor. Denies myalgias.  4. General: Patient endorses improvement in mood and appetite. She is very happy about her family coming to visit during the holiday season. "I feel great now."   Habits & Providers  Alcohol-Tobacco-Diet     Tobacco Status: never  Current Medications (verified): 1)  Glucophage 1000 Mg Tab (Metformin Hcl) .... Take 1 Tablet By Mouth Each Morning and Night 2)  Crestor 10 Mg Tabs (Rosuvastatin Calcium) .Marland Kitchen.. 1 Tablet 1 Time Per Day 3)  Novolog 100 Unit/ml Soln (Insulin Aspart) .... 2-4 Units Per Ss With Breakfast, Lunch, and Dinner 4)  Hydrochlorothiazide 25 Mg Tab (Hydrochlorothiazide) .... Take 1 Tablet By Mouth Once A Day 5)  Plavix 75 Mg Tabs (Clopidogrel Bisulfate) .... Take 1 Tablet By Mouth Once A Day 6)  Freestyle Freedom   Strips (Blood Glucose Monitoring Suppl) .... Check Sugars Four Times A Day 7)  Lancets Fine 28g   Misc (Lancets) .... Check Sugars Twice A Day 8)  Bd Insulin Syringe Ultrafine 30g X 1/2" 0.5 Ml  Misc (Insulin Syringe-Needle U-100) .... Daily Use of Insulin 1 Box - Supply For  One Month 9)  Accupril 40 Mg  Tabs (Quinapril Hcl) .... One By Mouth Daily 10)  Norvasc 10 Mg  Tabs (Amlodipine Besylate) .Marland Kitchen.. 1 Tab By Mouth Daily 11)  Trazodone Hcl 50 Mg Tabs (Trazodone Hcl) .Marland Kitchen.. 1 By Mouth At Bedtime As Needed Insomnia  Allergies (verified): No Known Drug Allergies PMH-FH-SH reviewed for relevance  Review of Systems General:  Denies chills and fever. CV:  Denies chest pain or discomfort, palpitations, shortness of breath with exertion, and swelling of feet. Resp:  Denies cough and shortness of breath. GI:  Denies abdominal pain, constipation, diarrhea, nausea, and vomiting. Derm:  Denies rash. Psych:  Denies anxiety and depression.  Physical Exam  General:  Well-developed, well-nourished, no acute distress; alert, appropriate and cooperative throughout examination. Vitals reviewed. Lungs:  Normal respiratory effort, chest expands symmetrically. Lungs are clear to auscultation, no crackles or wheezes. Heart:  Reg Rate and Rhythm. No murmur.   Impression & Recommendations:  Problem # 1:  DIABETES MELLITUS II, UNCOMPLICATED (ICD-250.00) Assessment Improved  Continue current regimen. Trying to avoid low BG. Her updated medication list for this problem includes:    Glucophage 1000 Mg Tab (Metformin hcl) .Marland Kitchen... Take 1 tablet by mouth each morning and night    Novolog  100 Unit/ml Soln (Insulin aspart) .Marland Kitchen... 2-4 units per ss with breakfast, lunch, and dinner    Accupril 40 Mg Tabs (Quinapril hcl) ..... One by mouth daily  Orders: [facility]- Est Level  3 (16109)  Problem # 2:  HYPERTENSION, BENIGN SYSTEMIC (ICD-401.1) Assessment: Unchanged  Her updated medication list for this problem includes:    Hydrochlorothiazide 25 Mg Tab (Hydrochlorothiazide) .Marland Kitchen... Take 1 tablet by mouth once a day    Accupril 40 Mg Tabs (Quinapril hcl) ..... One by mouth daily    Norvasc 10 Mg Tabs (Amlodipine besylate) .Marland Kitchen... 1 tab by mouth daily  Orders: [facility]- Est Level  3 (60454)  Problem  # 3:  HYPERLIPIDEMIA (ICD-272.4) Assessment: Unchanged  Rx refilled. Her updated medication list for this problem includes:    Crestor 10 Mg Tabs (Rosuvastatin calcium) .Marland Kitchen... 1 tablet 1 time per day  Orders: [facility]- Est Level  3 (09811)  Complete Medication List: 1)  Glucophage 1000 Mg Tab (Metformin hcl) .... Take 1 tablet by mouth each morning and night 2)  Crestor 10 Mg Tabs (Rosuvastatin calcium) .Marland Kitchen.. 1 tablet 1 time per day 3)  Novolog 100 Unit/ml Soln (Insulin aspart) .... 2-4 units per ss with breakfast, lunch, and dinner 4)  Hydrochlorothiazide 25 Mg Tab (Hydrochlorothiazide) .... Take 1 tablet by mouth once a day 5)  Plavix 75 Mg Tabs (Clopidogrel bisulfate) .... Take 1 tablet by mouth once a day 6)  Freestyle Freedom Strips (blood Glucose Monitoring Suppl)  .... Check sugars four times a day 7)  Lancets Fine 28g Misc (Lancets) .... Check sugars twice a day 8)  Bd Insulin Syringe Ultrafine 30g X 1/2" 0.5 Ml Misc (Insulin syringe-needle u-100) .... Daily use of insulin 1 box - supply for one month 9)  Accupril 40 Mg Tabs (Quinapril hcl) .... One by mouth daily 10)  Norvasc 10 Mg Tabs (Amlodipine besylate) .Marland Kitchen.. 1 tab by mouth daily 11)  Trazodone Hcl 50 Mg Tabs (Trazodone hcl) .Marland Kitchen.. 1 by mouth at bedtime as needed insomnia  Patient Instructions: 1)  It was nice to see you today and to see that you are doing so well! Prescriptions: CRESTOR 10 MG TABS (ROSUVASTATIN CALCIUM) 1 tablet 1 time per day  #90 x 3   Entered and Authorized by:   Helane Rima DO   Signed by:   Helane Rima DO on 10/04/2010   Method used:   Print then Give to Patient   RxID:   [id number]    Orders Added: 1)  [facility]- Est Level  3 [78469]    Prevention & Chronic Care Immunizations   Influenza vaccine: Fluvax 3+  (08/23/2010)   Influenza vaccine deferral: Not indicated  (02/22/2010)   Influenza vaccine due: 08/15/2011    Tetanus booster: 12/16/2009: Tdap   Td booster deferral: Not indicated   (08/23/2010)   Tetanus booster due: 12/16/2019    Pneumococcal vaccine: Done.  (10/14/1998)   Pneumococcal vaccine deferral: Not indicated  (08/23/2010)   Pneumococcal vaccine due: None  Colorectal Screening   Hemoccult: Done.  (07/15/2002)   Hemoccult due: Not Indicated    Colonoscopy: normal  (03/15/2004)   Colonoscopy action/deferral: Repeat colonoscopy in 5 years.   (02/03/2004)   Colonoscopy due: 03/2009  Other Screening   Pap smear: NEGATIVE FOR INTRAEPITHELIAL LESIONS OR MALIGNANCY.  (02/22/2010)   Pap smear action/deferral: Ordered  (08/10/2009)   Pap smear due: 03/2009    Mammogram: ASSESSMENT: Negative - BI-RADS 1^MM DIGITAL SCREENING  (11/17/2009)   Mammogram due: 10/21/2009  Smoking status: never  (10/04/2010)  Diabetes Mellitus   HgbA1C: 9.8  (08/23/2010)   Hemoglobin A1C due: 03/15/2010    Eye exam: normal  (03/21/2008)   Diabetic eye exam action/deferral: Not indicated  (08/10/2009)   Eye exam due: 03/21/2009    Foot exam: yes  (08/23/2010)   High risk foot: Not documented   Foot care education: Not documented   Foot exam due: 02/04/2009    Urine microalbumin/creatinine ratio: Not documented   Urine microalbumin action/deferral: Not indicated   Urine microalbumin/cr due: 03/27/2008    Diabetes flowsheet reviewed?: Yes   Progress toward A1C goal: Unchanged  Lipids   Total Cholesterol: 180  (02/22/2010)   Lipid panel action/deferral: Lipid Panel ordered   LDL: 96  (02/22/2010)   LDL Direct: 62  (05/16/2007)   HDL: 62  (02/22/2010)   Triglycerides: 109  (02/22/2010)    SGOT (AST): 11  (02/22/2010)   BMP action: Ordered   SGPT (ALT): <8 U/L  (02/22/2010)   Alkaline phosphatase: 87  (02/22/2010)   Total bilirubin: 0.4  (02/22/2010)    Lipid flowsheet reviewed?: Yes   Progress toward LDL goal: Unchanged  Hypertension   Last Blood Pressure: 103 / 68  (10/04/2010)   Serum creatinine: 0.72  (02/22/2010)   BMP action: Not indicated   Serum  potassium 4.8  (02/22/2010)    Hypertension flowsheet reviewed?: Yes   Progress toward BP goal: At goal  Self-Management Support :   Personal Goals (by the next clinic visit) :     Personal A1C goal: 8  (08/23/2010)     Personal blood pressure goal: 130/80  (08/10/2009)     Personal LDL goal: 100  (08/10/2009)    Patient will work on the following items until the next clinic visit to reach self-care goals:     Medications and monitoring: take my medicines every day, bring all of my medications to every visit  (10/04/2010)     Eating: drink diet soda or water instead of juice or soda, eat more vegetables, use fresh or frozen vegetables, eat foods that are low in salt, eat baked foods instead of fried foods, eat fruit for snacks and desserts, limit or avoid alcohol  (10/04/2010)     Activity: take a 30 minute walk every day, take the stairs instead of the elevator, park at the far end of the parking lot  (10/04/2010)    Diabetes self-management support: Written self-care plan  (10/04/2010)   Diabetes care plan printed    Hypertension self-management support: Written self-care plan  (10/04/2010)   Hypertension self-care plan printed.    Lipid self-management support: Written self-care plan  (10/04/2010)   Lipid self-care plan printed.

## 2010-12-14 NOTE — Progress Notes (Signed)
Summary: Rx Req   Phone Note Refill Request Call back at Home Phone [phone] Message from:  Patient  Refills Requested: Medication #1:  GLUCOPHAGE 1000 MG TAB Take 1 tablet by mouth each morning and night HARRIS TEETER  Initial call taken by: Clydell Hakim,  June 23, 2010 9:19 AM Caller: Patient    Prescriptions: GLUCOPHAGE 1000 MG TAB (METFORMIN HCL) Take 1 tablet by mouth each morning and night  #180 x 4   Entered and Authorized by:   Helane Rima DO   Signed by:   Helane Rima DO on 06/23/2010   Method used:   Electronically to        [facility] [address] (retail)       [address]       [place], [place]  [address]       Ph: [phone] or [phone]       Fax: [phone]   RxID:   [id number]

## 2010-12-14 NOTE — Assessment & Plan Note (Signed)
Summary: TO SEE [name] AT 12:30,TCB   Nurse Visit   Vital Signs:  Patient profile:   55 year old female Height:      64.5 inches Weight:      160.3 pounds BMI:     27.19  Vitals Entered By: Monica Almas PHD (June 10, 2010 12:16 PM)  Primary Care Provider:  Helane Rima DO   History of Present Illness: Assessment:  Spent 60 minutes with pt.  Usual eating pattern includes 3 meals and 2 snacks per day.  Avoided foods (disliked) include fried/baked chicken, lima beans, black-eyed peas, and non-fried fish.  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  She eats dinner after work.  24-hr recall suggests intake of  ~1100 kcal: B (9 AM)- 1 c raisin bran w/ 1/2 c 2% milk, water; L (PM)- 1/2 ham sandwich w/ 1 slc Amer cheese & 1/2 tsp mayo, 1/2 c canned peaches (in light syrup), 4 oz punch; D (PM)- 1/2 c greens (w/ smoked neck bones), 1/2 c yellow squash (in 1 tbsp canola oil),  ~2 oz BBQ beef rib, 1 slc corn bread, water;  Snk (10 PM)- 5 Ritz crackers, water.  Yesterday was atypical b/c dinner is usually a sandwich, i.e., chx patties w/ cheese on 2 slc bread along with water or 4 oz juice.  FBG have ranged from 134 to 240, but have usually been above 200.  Bedtime BGs have been running in the high 200s.  Usual bedtime is  ~10 PM, and she usually gets up  ~9 or 10 AM.  Discussed the importance of exercise to weight and BG management, including NOT walking if BG is low.  Zasha seemed very receptive to possibility of working exercise into her weekly routine.   Nutrition Diagnosis:  Physical inactivity (NB-2.1) related to lack of motivation as evidenced by no reported structured exercise.  Inappropriate intake of types of carbohydrate (NI-53.3) related to veg's as evidenced by 1 cup of veg's consumed yesterday reported as atypical (leftover from company for dinner on Sunday).    Intervention: See Patient Instructions.    Monitoring/Eval:  Dietary intake, body weight, and exercise at 4-wk F/U.      Patient Instructions: 1)  30-60 minutes walking, 3-5 X wk.  Physical activity will help your insulin work better, will help lower blood sugar, and it will help you to lose some weight.  Go first thing in the morning, after checking your blood sugar, and AFTER you have drunk at least 16 oz of water.  Remember to drink more water after you walk.   2)  Limit starchy foods to ONE per meal.  This means no more than one slice of bread OR 1/2 c pasta or rice or potatoes.  If you get low-calorie bread (40 cal per slice), then you could use 2 pieces, or you could use a high-protein, low-carbohydrate tortilla.   3)  ONE STARCH SERVING = 15 GRAMS OF CARBOHYDRATE.   4)  The best fruits for NOT spiking blood sugar include peaches, cantaloupe, apples, pears, and ANY of the berries.  When you eat bananas, have just a 1/2 at a time, and limit watermelon to 1 slice at a time.   5)  Vegetables at least 2 X day.  Fresh or frozen are BEST.  Use your microwave for veg's!  Also cook leftovers for the next day or two.   6)  If you have  juice, continue to limit to 4 oz at a time, and count THAT as your starch for that meal.   7)  If your blood sugars do not improve, please make an appt with Dr. Earlene Plater.   8)  Please schedule a follow-up nutrition appt in Sept:  [phone].   9)  Call Dr. Gerilyn Pilgrim if you have questions:  [phone].   10)  AT F/U, WE'LL DISCUSS FATS AND SATURATED FAT.    Allergies: No Known Drug Allergies  Orders Added: 1)  Inital Assessment Each - [facility] [74259]

## 2010-12-14 NOTE — Assessment & Plan Note (Signed)
Summary: cpe,df   Vital Signs:  Patient profile:   55 year old female Height:      64.5 inches Weight:      160 pounds BMI:     27.14 Temp:     98.2 degrees F oral Pulse rate:   96 / minute BP sitting:   128 / 78  (right arm) Cuff size:   regular  Vitals Entered By: Tessie Fass CMA (February 22, 2010 9:07 AM) CC: complete physical with pap, chronic issues Is Patient Diabetic? Yes Pain Assessment Patient in pain? no        Primary Care Provider:  Helane Rima DO  CC:  complete physical with pap and chronic issues.  History of Present Illness: 55 yo AAF with:  1. PAP: No Hx abnormal PAP. + Hx STD - Chlamydia s/p Tx "a few years ago." Not sexually active in > 1 year. LMP > 1 year ago. + Hot Flashes. Not interested in HRT. Hx of endometrial Bx 2004 - negative for hyperplasia or malignancy. Patient does not remember reason for testing.  2. DM: Rx metformin 1000 mg BID, Lantus 7 units daily. A1c = 8.4. She usually checks BG two times a day, am is usually 75 - 137, pm 150 -264. Low 60 with no sxs. Waiting on referral to Optho for cataracts and DM check.  3. HTN: Rx: Norvasc, HCTZ, Accupril. Controlled.  4. HLD: Rx: Crestor.  5. PMHx TIA: Rx: Plavix.   She denies CP, SOB, N/V/D/C, HA, dizziness, numbness/tingling in hands/feet, LE edema, abdominal pain, dyspepsia, fever/chills, malaise.   Habits & Providers  Alcohol-Tobacco-Diet     Tobacco Status: never  Current Medications (verified): 1)  Glucophage 1000 Mg Tab (Metformin Hcl) .... Take 1 Tablet By Mouth Each Morning and Night 2)  Crestor 10 Mg Tabs (Rosuvastatin Calcium) .Marland Kitchen.. 1 Tablet 1 Time Per Day 3)  Lantus 100 Unit/ml  Soln (Insulin Glargine) .... 5 Units of Insulin Once Daily in The Morning 4)  Hydrochlorothiazide 25 Mg Tab (Hydrochlorothiazide) .... Take 1/2 Tablet By Mouth Once A Day 5)  Cvs Iron 325 (65 Fe) Mg  Tabs (Ferrous Sulfate) .Marland Kitchen.. 1 Tablet Three Times A Day 6)  Plavix 75 Mg Tabs (Clopidogrel Bisulfate)  .... Take 1 Tablet By Mouth Once A Day 7)  Freestyle Freedom   Strips (Blood Glucose Monitoring Suppl) .... Check Sugars Four Times A Day 8)  Lancets Fine 28g   Misc (Lancets) .... Check Sugars Twice A Day 9)  Bd Insulin Syringe Ultrafine 30g X 1/2" 0.5 Ml  Misc (Insulin Syringe-Needle U-100) .... Daily Use of Insulin 1 Box - Supply For One Month 10)  Accupril 40 Mg  Tabs (Quinapril Hcl) .... One By Mouth Daily 11)  Norvasc 10 Mg  Tabs (Amlodipine Besylate) .Marland Kitchen.. 1 Tab By Mouth Daily 12)  Hydroxyzine Hcl 50 Mg Tabs (Hydroxyzine Hcl) .... 1/2 To 1 By Mouth Q 8 Hours As Needed Itching  Allergies (verified): No Known Drug Allergies  Past History:  Past medical, surgical, family and social histories (including risk factors) reviewed for relevance to current acute and chronic problems.  Past Medical History: Reviewed history from 08/10/2009 and no changes required. Benign Murmur      - (Hochrein 6/03.  2D echo normal) PMHx gastric ulcer TIA 2008- right arm/leg numbness.       - Negative MRI/MRA and head CT.  Neuro consulted, recommended starting Plavix. DM      - Insulin HTN HLD Anemia Postmenopausal  Past  Surgical History: Reviewed history from 08/10/2009 and no changes required. BTL 1988 Colonoscopy - normal, diverticulosis - 01/13/2004 EGD - normal -12/2006 (Dr. Yancey Flemings) Endometrial Biopsy - proliferative endo. No hyperplasia or malignancy - 05/13/2003 L and R breast lumpectomies benign 1970s   Family History: Reviewed history from 10/21/2009 and no changes required. Dad - CHF, ETOH Abuse Mom - DM Sisters - HTN, DM  Social History: Reviewed history from 10/21/2009 and no changes required. 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  Review of Systems       She denies CP, SOB, N/V/D/C, HA, dizziness, numbness/tingling in hands/feet, LE  edema, abdominal pain, dyspepsia, fever/chills, malaise.   Physical Exam  General:  Well-developed, well-nourished, n no acute distress; alert, appropriate and cooperative throughout examination. Vitals reviewed. Head:  Normocephalic and atraumatic without obvious abnormalities.  Eyes:  No corneal or conjunctival inflammation noted. EOMI.  Vision grossly normal. Ears:  R ear normal and L ear normal.   Neck:  No deformities, masses, or tenderness noted. Lungs:  Normal respiratory effort, chest expands symmetrically. Lungs are clear to auscultation, no crackles or wheezes. Heart:  Reg Rate and Rhythm. No murmer. Abdomen:  Bowel sounds positive,abdomen soft and non-tender without masses, organomegaly or hernias noted. Genitalia:  Pelvic Exam:        External: normal female genitalia without lesions or masses        Vagina: normal without lesions or masses, small amount white-clear discharge, samples taken        Cervix: normal without lesions or masses        Adnexa: normal bimanual exam without masses or fullness        Uterus: normal by palpation        Pap smear: performed Pulses:  2+ dp. Extremities:  No edema. Skin:  Intact without suspicious lesions or rashes. Psych:  Oriented X3, memory intact for recent and remote, normally interactive, good eye contact, not anxious appearing, and not depressed appearing.    Diabetes Management Exam:    Foot Exam (with socks and/or shoes not present):       Sensory-Pinprick/Light touch:          Left medial foot (L-4): normal          Left dorsal foot (L-5): normal          Left lateral foot (S-1): normal          Right medial foot (L-4): normal          Right dorsal foot (L-5): normal          Right lateral foot (S-1): normal       Sensory-Monofilament:          Left foot: normal          Right foot: normal       Inspection:          Left foot: normal          Right foot: normal       Nails:          Left foot: normal          Right  foot: normal   Impression & Recommendations:  Problem # 1:  Gynecological examination-routine (ICD-V72.31) Assessment Unchanged  Problem # 2:  SCREENING FOR MALIGNANT NEOPLASM OF THE CERVIX (ICD-V76.2) Assessment: Unchanged  Orders: Pap Smear-[facility] ([id number])  [facility] - Est  40-64 yrs (70623)  Problem # 3:  VAGINAL DISCHARGE (ICD-623.5) Assessment: New  Orders: Wet Prep- [facility] (76283) [facility] - Est  40-64 yrs (15176)  Problem # 4:  VAGINITIS, BACTERIAL (ICD-616.10) Assessment: New  Her updated medication list for this problem includes:    Metronidazole 500 Mg Tabs (Metronidazole) ..... One by mouth two times a day x 7 days  Orders: [facility] - Est  40-64 yrs (16073)  Problem # 5:  DIABETES MELLITUS II, UNCOMPLICATED (ICD-250.00) Assessment: Improved  Will recheck A1c next May. BG log showing much better control. Her updated medication list for this problem includes:    Glucophage 1000 Mg Tab (Metformin hcl) .Marland Kitchen... Take 1 tablet by mouth each morning and night    Lantus 100 Unit/ml Soln (Insulin glargine) .Marland KitchenMarland KitchenMarland KitchenMarland Kitchen 5 units of insulin once daily in the morning    Accupril 40 Mg Tabs (Quinapril hcl) ..... One by mouth daily  Orders: [facility] - Est  40-64 yrs (71062)  Problem # 6:  HYPERTENSION, BENIGN SYSTEMIC (ICD-401.1) Assessment: Unchanged  Her updated medication list for this problem includes:    Hydrochlorothiazide 25 Mg Tab (Hydrochlorothiazide) .Marland Kitchen... Take 1/2 tablet by mouth once a day    Accupril 40 Mg Tabs (Quinapril hcl) ..... One by mouth daily    Norvasc 10 Mg Tabs (Amlodipine besylate) .Marland Kitchen... 1 tab by mouth daily  Orders: Comp Met-[facility] [id number]) [facility] - Est  40-64 yrs (35009)  Problem # 7:  HYPERLIPIDEMIA (ICD-272.4) Assessment: Unchanged  Her updated medication list for this problem includes:    Crestor 10 Mg Tabs (Rosuvastatin calcium) .Marland Kitchen... 1 tablet 1 time per day  Orders: T-Lipid Profile ([id number]) Comp Met-[facility] ([id number]) [facility] - Est  40-64 yrs  (17510)  Complete Medication List: 1)  Glucophage 1000 Mg Tab (Metformin hcl) .... Take 1 tablet by mouth each morning and night 2)  Crestor 10 Mg Tabs (Rosuvastatin calcium) .Marland Kitchen.. 1 tablet 1 time per day 3)  Lantus 100 Unit/ml Soln (Insulin glargine) .... 5 units of insulin once daily in the morning 4)  Hydrochlorothiazide 25 Mg Tab (Hydrochlorothiazide) .... Take 1/2 tablet by mouth once a day 5)  Plavix 75 Mg Tabs (Clopidogrel bisulfate) .... Take 1 tablet by mouth once a day 6)  Freestyle Freedom Strips (blood Glucose Monitoring Suppl)  .... Check sugars four times a day 7)  Lancets Fine 28g Misc (Lancets) .... Check sugars twice a day 8)  Bd Insulin Syringe Ultrafine 30g X 1/2" 0.5 Ml Misc (Insulin syringe-needle u-100) .... Daily use of insulin 1 box - supply for one month 9)  Accupril 40 Mg Tabs (Quinapril hcl) .... One by mouth daily 10)  Norvasc 10 Mg Tabs (Amlodipine besylate) .Marland Kitchen.. 1 tab by mouth daily 11)  Metronidazole 500 Mg Tabs (Metronidazole) .... One by mouth two times a day x 7 days  Other Orders: Vit D, 25 [place]-[facility] ([id number]) CBC-[facility] (23536) GC/Chlamydia-[facility] ([id number])  Patient Instructions: 1)  It was nice to see you today! 2)  We will contact you with the results of your labs. Prescriptions: METRONIDAZOLE 500 MG  TABS (METRONIDAZOLE) one by mouth two times a day x 7 days  #14 x 0   Entered and Authorized by:   Helane Rima DO   Signed by:   Helane Rima DO on 02/22/2010   Method used:   Print then Give to Patient   RxID:   [id number] NORVASC 10 MG  TABS (AMLODIPINE BESYLATE) 1 tab by mouth daily  #90 x 4  Entered and Authorized by:   Helane Rima DO   Signed by:   Helane Rima DO on 02/22/2010   Method used:   Print then Give to Patient   RxID:   [id number] ACCUPRIL 40 MG  TABS (QUINAPRIL HCL) one by mouth daily  #90 x 4   Entered and Authorized by:   Helane Rima DO   Signed by:   Helane Rima DO on 02/22/2010   Method used:   Print  then Give to Patient   RxID:   [id number] BD INSULIN SYRINGE ULTRAFINE 30G X 1/2" 0.5 ML  MISC (INSULIN SYRINGE-NEEDLE U-100) daily use of insulin 1 box - supply for one month  #1 x 11   Entered and Authorized by:   Helane Rima DO   Signed by:   Helane Rima DO on 02/22/2010   Method used:   Print then Give to Patient   RxID:   [id number] LANCETS FINE 28G   MISC (LANCETS) check sugars twice a day  #100 x 12   Entered and Authorized by:   Helane Rima DO   Signed by:   Helane Rima DO on 02/22/2010   Method used:   Print then Give to Patient   RxID:   [id number] FREESTYLE FREEDOM   STRIPS (BLOOD GLUCOSE MONITORING SUPPL) Check sugars four times a day  #120 x 3   Entered and Authorized by:   Helane Rima DO   Signed by:   Helane Rima DO on 02/22/2010   Method used:   Print then Give to Patient   RxID:   [id number] PLAVIX 75 MG TABS (CLOPIDOGREL BISULFATE) Take 1 tablet by mouth once a day  #90 x 3   Entered and Authorized by:   Helane Rima DO   Signed by:   Helane Rima DO on 02/22/2010   Method used:   Print then Give to Patient   RxID:   [id number] HYDROCHLOROTHIAZIDE 25 MG TAB (HYDROCHLOROTHIAZIDE) Take 1/2 tablet by mouth once a day  #45 x 4   Entered and Authorized by:   Helane Rima DO   Signed by:   Helane Rima DO on 02/22/2010   Method used:   Print then Give to Patient   RxID:   [id number] CRESTOR 10 MG TABS (ROSUVASTATIN CALCIUM) 1 tablet 1 time per day  #90 x 3   Entered and Authorized by:   Helane Rima DO   Signed by:   Helane Rima DO on 02/22/2010   Method used:   Print then Give to Patient   RxID:   [id number] GLUCOPHAGE 1000 MG TAB (METFORMIN HCL) Take 1 tablet by mouth each morning and night  #180 x 4   Entered and Authorized by:   Helane Rima DO   Signed by:   Helane Rima DO on 02/22/2010   Method used:   Print then Give to Patient   RxID:   [id number]   Prevention & Chronic  Care Immunizations   Influenza vaccine: Fluvax Non-MCR  (08/28/2009)   Influenza vaccine deferral: Not indicated  (02/22/2010)   Influenza vaccine due: 10/2008    Tetanus booster: 12/16/2009: Tdap   Td booster deferral: Deferred  (08/10/2009)   Tetanus booster due: 02/12/2009    Pneumococcal vaccine: Done.  (10/14/1998)   Pneumococcal vaccine due: None  Colorectal Screening   Hemoccult: Done.  (07/15/2002)   Hemoccult due: Not Indicated    Colonoscopy: normal  (03/15/2004)   Colonoscopy action/deferral: Repeat colonoscopy in 5 years.   (02/03/2004)  Colonoscopy due: 03/2009  Other Screening   Pap smear: normal  (03/14/2006)   Pap smear action/deferral: Ordered  (08/10/2009)   Pap smear due: 03/2009    Mammogram: ASSESSMENT: Negative - BI-RADS 1^MM DIGITAL SCREENING  (11/17/2009)   Mammogram due: 10/21/2009   Smoking status: never  (02/22/2010)  Diabetes Mellitus   HgbA1C: 8.4  (12/16/2009)   Hemoglobin A1C due: 03/15/2010    Eye exam: normal  (03/21/2008)   Diabetic eye exam action/deferral: Not indicated  (08/10/2009)   Eye exam due: 03/21/2009    Foot exam: yes  (02/22/2010)   High risk foot: Not documented   Foot care education: Not documented   Foot exam due: 02/04/2009    Urine microalbumin/creatinine ratio: Not documented   Urine microalbumin action/deferral: Not indicated   Urine microalbumin/cr due: 03/27/2008    Diabetes flowsheet reviewed?: Yes   Progress toward A1C goal: Unchanged  Lipids   Total Cholesterol: 139  (02/03/2009)   Lipid panel action/deferral: Lipid Panel ordered   LDL: 59  (02/03/2009)   LDL Direct: 62  (05/16/2007)   HDL: 60  (02/03/2009)   Triglycerides: 99  (02/03/2009)    SGOT (AST): 18  (02/03/2009)   BMP action: Ordered   SGPT (ALT): 8  (02/03/2009) CMP ordered    Alkaline phosphatase: 82  (02/03/2009)   Total bilirubin: 0.3  (02/03/2009)    Lipid flowsheet reviewed?: Yes   Progress toward LDL goal:  Unchanged  Hypertension   Last Blood Pressure: 128 / 78  (02/22/2010)   Serum creatinine: 0.75  (02/03/2009)   BMP action: Not indicated   Serum potassium 4.9  (02/03/2009) CMP ordered     Hypertension flowsheet reviewed?: Yes   Progress toward BP goal: At goal  Self-Management Support :   Personal Goals (by the next clinic visit) :     Personal A1C goal: 7  (10/21/2009)     Personal blood pressure goal: 130/80  (08/10/2009)     Personal LDL goal: 100  (08/10/2009)    Patient will work on the following items until the next clinic visit to reach self-care goals:     Medications and monitoring: take my medicines every day, bring all of my medications to every visit  (02/22/2010)     Eating: drink diet soda or water instead of juice or soda, eat more vegetables, use fresh or frozen vegetables, eat foods that are low in salt, eat baked foods instead of fried foods, eat fruit for snacks and desserts, limit or avoid alcohol  (02/22/2010)     Activity: take a 30 minute walk every day, take the stairs instead of the elevator, park at the far end of the parking lot  (02/22/2010)    Diabetes self-management support: Written self-care plan  (02/22/2010)   Diabetes care plan printed    Hypertension self-management support: Written self-care plan  (02/22/2010)   Hypertension self-care plan printed.    Lipid self-management support: Written self-care plan  (02/22/2010)   Lipid self-care plan printed.   Laboratory Results  Date/Time Received: February 22, 2010 9:37 AM  Date/Time Reported: February 22, 2010 9:43 AM   [employer] Source: vag WBC/hpf: >20 Bacteria/hpf: 3+  Cocci Clue cells/hpf: many  Positive whiff Yeast/hpf: none Trichomonas/hpf: none Comments: ...............test performed by......Marland KitchenBonnie A. [place], MLS (ASCP)cm

## 2010-12-14 NOTE — Miscellaneous (Signed)
Summary: eye dr   [facility] project access has no volunteer opthamologists to see her. forms to pcp.[place]en Circle RN  October 07, 2009 10:36 AM

## 2010-12-14 NOTE — Miscellaneous (Signed)
Summary: Call from [facility]   [facility]  received  call from Sherryl Barters of the [facility]. they have seen patient recently ( referral sent in in July 2010 ) and her BP was elevated. They just want to make sure that she is being treated for hypertension and needs a note from MD stating it is OK to treat her dentally. They would like a list of her medications. will send message to MD. will then fax to [phone]  Theresia Lo RN  December 29, 2009 4:43 PM  Please send "Letter to Dental Clinic" along with list of medications to [facility]. Helane Rima DO  December 29, 2009 4:55 PM

## 2010-12-14 NOTE — Assessment & Plan Note (Signed)
Summary: f/up high sugar,tcb   Vital Signs:  Patient profile:   55 year old female Height:      64.5 inches Weight:      165 pounds BMI:     27.99 Temp:     98.5 degrees F oral Pulse rate:   106 / minute BP sitting:   132 / 78  (left arm) Cuff size:   regular  Vitals Entered By: Tessie Fass CMA (October 21, 2009 1:37 PM) CC: F/U diabetes, HTN, HLD Is Patient Diabetic? Yes Pain Assessment Patient in pain? no        Primary Care Provider:  Helane Rima DO  CC:  F/U diabetes, HTN, and HLD.  History of Present Illness: 55 yo AAF with:  1. DM: Rx metformin 1000 mg BID, Lantus 10 units in am, Novolog 5 units before dinner. She has not taken the Novolog in > 1 month. A1c 08/10/09 = 7.3. She usually checks BG two times a day, but hasn't had strips in several weeks. 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 She reports being "sick" 5 days ago. States that she went to a family member's house, ate dinner, had some ETOH. The next thing she recalls is waking with oxygen in her nose and an IV in her arm. The EMS workers told her that her BG was 11. They did not transport her to the hospital afterwards.  2. HTN: Rx: Norvasc, HCTZ, Accupril. Controlled.  3. HLD: Rx: Crestor.  4. PMHx TIA: Rx: Plavix.   She denies CP, SOB, N/V/D/C, HA, dizziness, numbness/tingling in hands/feet, LE edema, abdominal pain, dyspepsia, fever/chills, malaise. Endorses eating less.  Habits & Providers  Alcohol-Tobacco-Diet     Tobacco Status: current     Other Tobacco snuff  Current Medications (verified): 1)  Glucophage 1000 Mg Tab (Metformin Hcl) .... Take 1 Tablet By Mouth Each Morning and Night 2)  Crestor 10 Mg Tabs (Rosuvastatin Calcium) .Marland Kitchen.. 1 Tablet 1 Time Per Day 3)  Lantus 100 Unit/ml  Soln (Insulin Glargine) .... 5 Units of Insulin Once Daily in The Morning 4)  Hydrochlorothiazide 25 Mg Tab  (Hydrochlorothiazide) .... Take 1/2 Tablet By Mouth Once A Day 5)  Cvs Iron 325 (65 Fe) Mg  Tabs (Ferrous Sulfate) .Marland Kitchen.. 1 Tablet Three Times A Day 6)  Plavix 75 Mg Tabs (Clopidogrel Bisulfate) .... Take 1 Tablet By Mouth Once A Day 7)  Freestyle Freedom   Strips (Blood Glucose Monitoring Suppl) .... Check Sugars Four Times A Day 8)  Lancets Fine 28g   Misc (Lancets) .... Check Sugars Twice A Day 9)  Bd Insulin Syringe Ultrafine 30g X 1/2" 0.5 Ml  Misc (Insulin Syringe-Needle U-100) .... Daily Use of Insulin 1 Box - Supply For One Month 10)  Accupril 40 Mg  Tabs (Quinapril Hcl) .... One By Mouth Daily 11)  Norvasc 10 Mg  Tabs (Amlodipine Besylate) .Marland Kitchen.. 1 Tab By Mouth Daily 12)  Xyzal 5 Mg Tabs (Levocetirizine Dihydrochloride) .... One By Mouth Daily  Allergies (verified): No Known Drug Allergies  Past History:  Past medical, surgical, family and social histories (including risk factors) reviewed for relevance to current acute and chronic problems.  Past Medical History: Reviewed history from 08/10/2009 and no changes required. Benign Murmur      - (Hochrein 6/03.  2D echo normal) PMHx gastric ulcer TIA 2008- right arm/leg numbness.       - Negative  MRI/MRA and head CT.  Neuro consulted, recommended starting Plavix. DM      - Insulin HTN HLD Anemia Postmenopausal  Past Surgical History: Reviewed history from 08/10/2009 and no changes required. BTL 1988 Colonoscopy - normal, diverticulosis - 01/13/2004 EGD - normal -12/2006 (Dr. Yancey Flemings) Endometrial Biopsy - proliferative endo. No hyperplasia or malignancy - 05/13/2003 L and R breast lumpectomies benign 1970s   Family History: Reviewed history from 05/14/2008 and no changes required. Dad - CHF, ETOH Abuse Mom - DM Sisters - HTN, DM  Social History: Reviewed history from 08/10/2009 and no changes required. 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 Endorses eating less.  Physical Exam  General:  Well-developed, well-nourished, n no acute distress; alert, appropriate and cooperative throughout examination. Vitals reviewed. Lungs:  Normal respiratory effort, chest expands symmetrically. Lungs are clear to auscultation, no crackles or wheezes. Heart:  Tachy at 106. Reg Rhythm. No murmur. Extremities:  Non-pitting edema bilaterally. Skin:  Intact without suspicious lesions or rashes. Psych:  Memory intact for recent and remote, good eye contact, and flat affect.     Impression & Recommendations:  Problem # 1:  DIABETES MELLITUS II, UNCOMPLICATED (ICD-250.00) Assessment Deteriorated Will DC Novolog and decrease Lantus to 5 units each am as patient is showing s/s of decreased insulin needs. Gave RED FLAGS for promptly seeking medical care. Gave SICK CARE instructions. Gave HYPOGLYCEMIA instructions. Advised her to check BG QID while we are adjusting insulin.  The following medications were removed from the medication list:    Novolog 100 Unit/ml Soln (Insulin aspart) ..... Inject 5 units prior to dinner if pre-meal sugar is more than 200.  give 1 month supply Her updated medication list for this problem includes:    Glucophage 1000 Mg Tab (Metformin hcl) .Marland Kitchen... Take 1 tablet by mouth each morning and night    Lantus 100 Unit/ml Soln (Insulin glargine) .Marland KitchenMarland KitchenMarland KitchenMarland Kitchen 5 units of insulin once daily in the morning    Accupril 40 Mg Tabs (Quinapril hcl) ..... One by mouth daily  Orders: [facility]- Est  Level 4 (98119)  Problem # 2:  HYPERTENSION, BENIGN SYSTEMIC (ICD-401.1) Assessment: Unchanged  Her updated medication list for this problem includes:    Hydrochlorothiazide 25 Mg Tab (Hydrochlorothiazide)  .Marland Kitchen... Take 1/2 tablet by mouth once a day    Accupril 40 Mg Tabs (Quinapril hcl) ..... One by mouth daily    Norvasc 10 Mg Tabs (Amlodipine besylate) .Marland Kitchen... 1 tab by mouth daily  Orders: [facility]- Est  Level 4 (14782)  Problem # 3:  HYPERLIPIDEMIA (ICD-272.4) Assessment: Unchanged  Her updated medication list for this problem includes:    Crestor 10 Mg Tabs (Rosuvastatin calcium) .Marland Kitchen... 1 tablet 1 time per day  Orders: [facility]- Est  Level 4 (95621)  Problem # 4:  TIA (ICD-435.9) Assessment: Unchanged  Her updated medication list for this problem includes:    Plavix 75 Mg Tabs (Clopidogrel bisulfate) .Marland Kitchen... Take 1 tablet by mouth once a day  Orders: [facility]- Est  Level 4 (30865)  Problem # 5:  SMOKELESS TOBACCO ABUSE (ICD-305.1) Assessment: Unchanged  Patient not ready to quit.  Orders: [facility]- Est  Level 4 (78469)  Complete Medication List:  1)  Glucophage 1000 Mg Tab (Metformin hcl) .... Take 1 tablet by mouth each morning and night 2)  Crestor 10 Mg Tabs (Rosuvastatin calcium) .Marland Kitchen.. 1 tablet 1 time per day 3)  Lantus 100 Unit/ml Soln (Insulin glargine) .... 5 units of insulin once daily in the morning 4)  Hydrochlorothiazide 25 Mg Tab (Hydrochlorothiazide) .... Take 1/2 tablet by mouth once a day 5)  Cvs Iron 325 (65 Fe) Mg Tabs (Ferrous sulfate) .Marland Kitchen.. 1 tablet three times a day 6)  Plavix 75 Mg Tabs (Clopidogrel bisulfate) .... Take 1 tablet by mouth once a day 7)  Freestyle Freedom Strips (blood Glucose Monitoring Suppl)  .... Check sugars four times a day 8)  Lancets Fine 28g Misc (Lancets) .... Check sugars twice a day 9)  Bd Insulin Syringe Ultrafine 30g X 1/2" 0.5 Ml Misc (Insulin syringe-needle u-100) .... Daily use of insulin 1 box - supply for one month 10)  Accupril 40 Mg Tabs (Quinapril hcl) .... One by mouth daily 11)  Norvasc 10 Mg Tabs (Amlodipine besylate) .Marland Kitchen.. 1 tab by mouth daily 12)  Xyzal 5 Mg Tabs (Levocetirizine dihydrochloride) .... One by mouth daily  Patient  Instructions: 1)  It was nice to see you today! 2)  You may not need as much insulin now. I am decreasing your Lantus to 5 units each morning. Do not use the Novolog at night anymore.  3)  Check your blood sugar 2-4 times a day for the next 2 weeks (at least). Record these numbers so that we can go over them at the next visit. Call me if you have a fasting blood sugar less than 70.  4)  Come back in one month. Prescriptions: FREESTYLE FREEDOM   STRIPS (BLOOD GLUCOSE MONITORING SUPPL) Check sugars four times a day  #120 x 3   Entered and Authorized by:   Helane Rima DO   Signed by:   Helane Rima DO on 10/21/2009   Method used:   Print then Give to Patient   RxID:   [id number] PLAVIX 75 MG TABS (CLOPIDOGREL BISULFATE) Take 1 tablet by mouth once a day  #90 x 3   Entered and Authorized by:   Helane Rima DO   Signed by:   Helane Rima DO on 10/21/2009   Method used:   Print then Give to Patient   RxID:   [id number] CRESTOR 10 MG TABS (ROSUVASTATIN CALCIUM) 1 tablet 1 time per day  #90 x 3   Entered and Authorized by:   Helane Rima DO   Signed by:   Helane Rima DO on 10/21/2009   Method used:   Print then Give to Patient   RxID:   [id number]   Prevention & Chronic Care Immunizations   Influenza vaccine: Fluvax Non-MCR  (08/28/2009)   Influenza vaccine due: 10/2008    Tetanus booster: 02/13/1999: Done.   Td booster deferral: Deferred  (08/10/2009)   Tetanus booster due: 02/12/2009    Pneumococcal vaccine: Done.  (10/14/1998)   Pneumococcal vaccine due: None  Colorectal Screening   Hemoccult: Done.  (07/15/2002)   Hemoccult due: Not Indicated    Colonoscopy: normal  (03/15/2004)   Colonoscopy action/deferral: Repeat colonoscopy in 5 years.   (02/03/2004)   Colonoscopy due: 03/2009  Other Screening   Pap smear: normal  (03/14/2006)   Pap smear action/deferral: Ordered  (08/10/2009)   Pap smear due: 03/2009    Mammogram: normal  (10/21/2008)    Mammogram due: 10/21/2009   Smoking status: current  (  10/21/2009)  Diabetes Mellitus   HgbA1C: 7.3  (08/10/2009)   Hemoglobin A1C due: 05/07/2008    Eye exam: normal  (03/21/2008)   Diabetic eye exam action/deferral: Not indicated  (08/10/2009)   Eye exam due: 03/21/2009    Foot exam: yes  (05/13/2009)   High risk foot: Not documented   Foot care education: Not documented   Foot exam due: 02/04/2009    Urine microalbumin/creatinine ratio: Not documented   Urine microalbumin action/deferral: Not indicated   Urine microalbumin/cr due: 03/27/2008    Diabetes flowsheet reviewed?: Yes   Progress toward A1C goal: Unchanged  Lipids   Total Cholesterol: 139  (02/03/2009)   LDL: 59  (02/03/2009)   LDL Direct: 62  (05/16/2007)   HDL: 60  (02/03/2009)   Triglycerides: 99  (02/03/2009)    SGOT (AST): 18  (02/03/2009)   SGPT (ALT): 8  (02/03/2009)   Alkaline phosphatase: 82  (02/03/2009)   Total bilirubin: 0.3  (02/03/2009)    Lipid flowsheet reviewed?: Yes   Progress toward LDL goal: Improved  Hypertension   Last Blood Pressure: 132 / 78  (10/21/2009)   Serum creatinine: 0.75  (02/03/2009)   Serum potassium 4.9  (02/03/2009)    Hypertension flowsheet reviewed?: Yes   Progress toward BP goal: At goal  Self-Management Support :   Personal Goals (by the next clinic visit) :     Personal A1C goal: 7  (10/21/2009)     Personal blood pressure goal: 130/80  (08/10/2009)     Personal LDL goal: 100  (08/10/2009)    Patient will work on the following items until the next clinic visit to reach self-care goals:     Medications and monitoring: take my medicines every day, bring all of my medications to every visit  (10/21/2009)     Eating: drink diet soda or water instead of juice or soda, eat more vegetables, use fresh or frozen vegetables, eat foods that are low in salt, eat baked foods instead of fried foods, eat fruit for snacks and desserts, limit or avoid alcohol  (10/21/2009)      Activity: take a 30 minute walk every day, take the stairs instead of the elevator, park at the far end of the parking lot  (10/21/2009)    Diabetes self-management support: Written self-care plan  (10/21/2009)   Diabetes care plan printed    Hypertension self-management support: Written self-care plan  (10/21/2009)   Hypertension self-care plan printed.    Lipid self-management support: Written self-care plan  (10/21/2009)   Lipid self-care plan printed.

## 2010-12-14 NOTE — Letter (Signed)
Summary: Letter to Dental Clnic  [facility]  [address]   [place], [place] [address]   Phone: [phone]  Fax: [phone]    12/29/2009  Re: [name] [address] [place], [place]  [address]  [name] is being treated with HCTZ, Accupril, and Norvasc for her hypertension. Her blood pressure readings have been controlled at her last several visits.  We are sending a list of her medications.  She is eligible for dental procedures if BP <150/90.   Sincerely,   Helane Rima DO

## 2010-12-14 NOTE — Progress Notes (Signed)
Summary: refill   Phone Note Refill Request Call back at Home Phone [phone] Message from:  Patient  Refills Requested: Medication #1:  GLUCOPHAGE 1000 MG TAB Take 1 tablet by mouth each morning and night Initial call taken by: De Nurse,  November 17, 2009 2:21 PM  Follow-up for Phone Call        to pcp Follow-up by: Golden Circle RN,  November 17, 2009 2:24 PM    Prescriptions: GLUCOPHAGE 1000 MG TAB (METFORMIN HCL) Take 1 tablet by mouth each morning and night  #60 x 6   Entered by:   Golden Circle RN   Authorized by:   Helane Rima DO   Signed by:   Golden Circle RN on 11/17/2009   Method used:   Printed then faxed to ...       [facility] [address] (retail)       [address]       [facility]       [place], [place]  [address]       Ph: [phone]       Fax: [phone]   RxID:   [id number]  faxed to health dept.Golden Circle RN  November 17, 2009 2:26 PM. states her bottle says she has no more refills.Golden Circle RN  November 17, 2009 2:26 PM  Appended Document: Orders Update     Clinical Lists Changes  Orders: Added new Test order of A1C-[facility] [phone]) - Signed

## 2010-12-16 NOTE — Progress Notes (Signed)
Summary: UPDATE PROBLEM LIST   

## 2010-12-17 NOTE — Consult Note (Signed)
Summary: [facility]   Imported By: Clydell Hakim 06/17/2009 11:29:51  _____________________________________________________________________  External Attachment:    Type:   Image     Comment:   External Document

## 2011-01-21 ENCOUNTER — Ambulatory Visit (INDEPENDENT_AMBULATORY_CARE_PROVIDER_SITE_OTHER): Payer: Self-pay | Admitting: Family Medicine

## 2011-01-21 ENCOUNTER — Encounter: Payer: Self-pay | Admitting: Family Medicine

## 2011-01-21 DIAGNOSIS — I10 Essential (primary) hypertension: Secondary | ICD-10-CM

## 2011-01-21 DIAGNOSIS — M542 Cervicalgia: Secondary | ICD-10-CM | POA: Insufficient documentation

## 2011-01-21 DIAGNOSIS — E119 Type 2 diabetes mellitus without complications: Secondary | ICD-10-CM

## 2011-01-21 DIAGNOSIS — E785 Hyperlipidemia, unspecified: Secondary | ICD-10-CM

## 2011-01-21 LAB — POCT GLYCOSYLATED HEMOGLOBIN (HGB A1C): Hemoglobin A1C: 8.3

## 2011-01-21 MED ORDER — MELOXICAM 15 MG PO TABS: 15.0000 mg | ORAL_TABLET | Freq: Every day | ORAL | Status: DC

## 2011-01-21 MED ORDER — CYCLOBENZAPRINE HCL 5 MG PO TABS: 5.0000 mg | ORAL_TABLET | Freq: Three times a day (TID) | ORAL | Status: DC | PRN

## 2011-01-21 NOTE — Patient Instructions (Signed)
[name] improved from 9.8 to 8.3! Great job.  I am prescribing Mobic and Flexeril for your neck pain.

## 2011-01-26 ENCOUNTER — Emergency Department (HOSPITAL_COMMUNITY): Payer: Self-pay

## 2011-01-26 ENCOUNTER — Observation Stay (HOSPITAL_COMMUNITY)
Admission: EM | Admit: 2011-01-26 | Discharge: 2011-01-27 | Disposition: A | Discharge status: Home / Self Care | Payer: Self-pay | Attending: Family Medicine | Admitting: Family Medicine

## 2011-01-26 DIAGNOSIS — E119 Type 2 diabetes mellitus without complications: Secondary | ICD-10-CM | POA: Insufficient documentation

## 2011-01-26 DIAGNOSIS — Z7902 Long term (current) use of antithrombotics/antiplatelets: Secondary | ICD-10-CM | POA: Insufficient documentation

## 2011-01-26 DIAGNOSIS — R0789 Other chest pain: Secondary | ICD-10-CM

## 2011-01-26 DIAGNOSIS — E785 Hyperlipidemia, unspecified: Secondary | ICD-10-CM | POA: Insufficient documentation

## 2011-01-26 DIAGNOSIS — Z79899 Other long term (current) drug therapy: Secondary | ICD-10-CM | POA: Insufficient documentation

## 2011-01-26 DIAGNOSIS — Z794 Long term (current) use of insulin: Secondary | ICD-10-CM | POA: Insufficient documentation

## 2011-01-26 DIAGNOSIS — F17200 Nicotine dependence, unspecified, uncomplicated: Secondary | ICD-10-CM | POA: Insufficient documentation

## 2011-01-26 DIAGNOSIS — E669 Obesity, unspecified: Secondary | ICD-10-CM | POA: Insufficient documentation

## 2011-01-26 DIAGNOSIS — R079 Chest pain, unspecified: Principal | ICD-10-CM | POA: Insufficient documentation

## 2011-01-26 DIAGNOSIS — D649 Anemia, unspecified: Secondary | ICD-10-CM | POA: Insufficient documentation

## 2011-01-26 DIAGNOSIS — I10 Essential (primary) hypertension: Secondary | ICD-10-CM | POA: Insufficient documentation

## 2011-01-26 DIAGNOSIS — Z7982 Long term (current) use of aspirin: Secondary | ICD-10-CM | POA: Insufficient documentation

## 2011-01-26 DIAGNOSIS — Z8673 Personal history of transient ischemic attack (TIA), and cerebral infarction without residual deficits: Secondary | ICD-10-CM | POA: Insufficient documentation

## 2011-01-27 ENCOUNTER — Observation Stay (HOSPITAL_COMMUNITY): Payer: Self-pay

## 2011-01-27 ENCOUNTER — Encounter: Payer: Self-pay | Admitting: Family Medicine

## 2011-01-27 LAB — CBC
HCT: 34.4 % — ABNORMAL LOW (ref 36.0–46.0)
Hemoglobin: 11.2 g/dL — ABNORMAL LOW (ref 12.0–15.0)
MCV: 87.1 fL (ref 78.0–100.0)
RBC: 3.95 MIL/uL (ref 3.87–5.11)
WBC: 5.3 10*3/uL (ref 4.0–10.5)

## 2011-01-27 LAB — POCT CARDIAC MARKERS
CKMB, poc: <1 ng/mL — ABNORMAL LOW (ref 1.0–8.0)
Myoglobin, poc: 59.2 ng/mL (ref 12–200)
Troponin i, poc: <0.05 ng/mL (ref 0.00–0.09)

## 2011-01-27 LAB — LIPID PANEL
Triglycerides: 40 mg/dL (ref <150)
VLDL: 8 mg/dL (ref 0–40)

## 2011-01-27 LAB — D-DIMER, QUANTITATIVE: D-Dimer, Quant: <0.22 ug/mL-FEU (ref 0.00–0.48)

## 2011-01-27 LAB — DIFFERENTIAL
Basophils Absolute: 0 10*3/uL (ref 0.0–0.1)
Eosinophils Relative: 1 % (ref 0–5)
Lymphocytes Relative: 32 % (ref 12–46)
Lymphs Abs: 1.7 10*3/uL (ref 0.7–4.0)
Neutro Abs: 2.9 10*3/uL (ref 1.7–7.7)

## 2011-01-27 LAB — BASIC METABOLIC PANEL
Calcium: 9.9 mg/dL (ref 8.4–10.5)
GFR calc Af Amer: >60 mL/min (ref >60)
GFR calc non Af Amer: >60 mL/min (ref >60)
Potassium: 3.7 mEq/L (ref 3.5–5.1)
Sodium: 137 mEq/L (ref 135–145)

## 2011-01-27 LAB — GLUCOSE, CAPILLARY
Glucose-Capillary: 169 mg/dL — ABNORMAL HIGH (ref 70–99)
Glucose-Capillary: 170 mg/dL — ABNORMAL HIGH (ref 70–99)

## 2011-01-27 LAB — TROPONIN I: Troponin I: 0.01 ng/mL (ref 0.00–0.06)

## 2011-01-27 LAB — CARDIAC PANEL(CRET KIN+CKTOT+MB+TROPI)
CK, MB: 0.5 ng/mL (ref 0.3–4.0)
Troponin I: 0.01 ng/mL (ref 0.00–0.06)

## 2011-01-27 LAB — CK TOTAL AND CKMB (NOT AT ARMC)
CK, MB: 0.6 ng/mL (ref 0.3–4.0)
Total CK: 69 U/L (ref 7–177)

## 2011-01-27 LAB — TSH: TSH: 1.521 u[IU]/mL (ref 0.350–4.500)

## 2011-01-27 LAB — VITAMIN D 25 HYDROXY (VIT D DEFICIENCY, FRACTURES): Vit D, 25-Hydroxy: 24 ng/mL — ABNORMAL LOW (ref 30–89)

## 2011-01-27 NOTE — Assessment & Plan Note (Signed)
A1c improved. Continue current treatment. Encouraged patient to follow up with nutrition one more time. Encouraged exercise.

## 2011-01-27 NOTE — Assessment & Plan Note (Signed)
Continue current treatment. FLP due. Patient not fasting today - will come back.

## 2011-01-27 NOTE — Assessment & Plan Note (Signed)
Stable.  Continue current treatment

## 2011-01-27 NOTE — Assessment & Plan Note (Signed)
No red flags. Discussed stretching or symptomatic treatment.

## 2011-01-27 NOTE — H&P (Signed)
Family Medicine Teaching [facility] Admission History and Physical  Patient name: [name] Medical record number: [id number] Date of birth: 1956/10/10 Age: 55 y.o. Gender: female  Primary Care Provider: Helane Rima, DO  Chief Complaint: chest pain History of Present Illness: [name] is a 55 y.o. year old female presenting with chest pain.  Acute onset, approx 3 hours ago, pain is harpe intermittant last seconds then resolves, substernal radiates down left arm 5/10 pain. Pt was lying down at the time, never had pain before, pain went away on own not associated with food or deep breathing, not reproducible. Denies fever, chills, nausea vomiting abdominal pain, dysuria, chest pain, shortness of breath dyspnea on exertion or numbness in extremities   Patient Active Problem List  Diagnoses  . DIABETES MELLITUS II, UNCOMPLICATED  . HYPERLIPIDEMIA  . OBESITY  . ANEMIA, IRON DEFICIENCY, UNSPEC.  Marland Kitchen SMOKELESS TOBACCO ABUSE  . CATARACTS  . HYPERTENSION, BENIGN SYSTEMIC  . TIA  . DIVERTICULOSIS, COLON W/HEM  . Neck pain   Past Medical History: No past medical history on file. see above  Past Surgical History: Status post bilateral tubal ligation in 1988.  Status post benign right and left breast lumpectomies in the 1970s  Status post endometrial biopsy which showed proliferative endometrium with no hyperplasia or malignancy. .Status post EGD which was normal in 2005. .Status post colonoscopy in 2005 which was normal except for diverticulosis.  Social History:  The patient currently lives with her youngest daughter  in [place].  Denies any history of smoking but does report snuff use.  The patient has a history of excessive alcohol use, currently reports  only two beers weekly.  Denies any other drug use.  Family History:   Family history is significant for alcohol abuse.  The  patient's mother has diabetes.  Father has congestive heart failure.  The patient  has sisters with both hypertension and diabetes.  The  patient's brother has diabetes and gout.  The patient denies family  history of stroke.  Allergies: No Known Allergies  Current Outpatient Prescriptions  Medication Sig Dispense Refill  . amLODipine (NORVASC) 10 MG tablet Take 10 mg by mouth daily.        . clopidogrel (PLAVIX) 75 MG tablet Take 75 mg by mouth daily.        . cyclobenzaprine (FLEXERIL) 5 MG tablet Take 1 tablet (5 mg total) by mouth every 8 (eight) hours as needed for muscle spasms.  30 tablet  1  . glucose blood test strip Freestyle Freedom Strips. Check sugars 4 times a day.       . hydrochlorothiazide 25 MG tablet Take 25 mg by mouth daily.        . insulin aspart (NOVOLOG) 100 UNIT/ML injection Inject 2-4 units per sliding scale with breakfast, lunch, and dinner.      . Insulin Syringe-Needle U-100 (B-D INS SYR ULTRAFINE .5CC/30G) 30G X 1/2" 0.5 ML MISC daily use of insulin 1 box - supply for one month       . Lancets Fine 28G MISC Check sugars twice a day       . meloxicam (MOBIC) 15 MG tablet Take 1 tablet (15 mg total) by mouth daily.  30 tablet  2  . metFORMIN (GLUCOPHAGE) 1000 MG tablet Take 1 tablet by mouth each morning and night      . quinapril (ACCUPRIL) 40 MG tablet Take 40 mg by mouth daily.       . rosuvastatin (CRESTOR)  10 MG tablet Take 10 mg by mouth daily.        . traZODone (DESYREL) 50 MG tablet Take 50 mg by mouth at bedtime as needed. For insomnia        Review Of Systems: Per HPI with the following additions: Otherwise 12 point review of systems was performed and was unremarkable.  Physical Exam: Pulse: 99  Blood Pressure: 136/79 RR: 18   O2: 100 on ra Temp: 98.5  General: alert, cooperative and appears stated age HEENT: PERRLA and extra ocular movement intact Heart: S1, S2 normal, no murmur, rub or gallop, regular rate and rhythm Lungs: clear to auscultation, no wheezes or rales and unlabored breathing Abdomen: abdomen is soft without  significant tenderness, masses, organomegaly or guarding Extremities: extremities normal, atraumatic, no cyanosis or edema Skin:no rashes Neurology: normal without focal findings, mental status, speech normal, alert and oriented x3, PERLA and reflexes normal and symmetric  Labs and Imaging: Lab Results  Component Value Date/Time   NA 137 01/26/2011 11:40 PM   K 3.7 01/26/2011 11:40 PM   CL 100 01/26/2011 11:40 PM   CO2 30 01/26/2011 11:40 PM   BUN 11 01/26/2011 11:40 PM   CREATININE 0.70 01/26/2011 11:40 PM   GLUCOSE 161* 01/26/2011 11:40 PM   Lab Results  Component Value Date   WBC 5.3 01/26/2011   HGB 11.2* 01/26/2011   HCT 34.4* 01/26/2011   MCV 87.1 01/26/2011   PLT 212 01/26/2011   POC CE neg x 1  CXR: no acute process  D- dimer 0.22  Assessment and Plan: [name] is a 55 y.o. year old female presenting with new onset typical chest pain. 1. Chest pain-  Substernal with radiation lasted seconds, but was at rest, went away on own, non reproducible.  Pt though.  TIMI score of 1 and [place] puts pt at high risk.   Will monitor on tele cycle enzymes, will start protonix, will give pt nitro to have EKG in AM, Pt does give some hx of anxiety about the possibility of losing her job in near future.  If pt does well and CE normal would f/u with PCP where she could get pt ready for a stress test as outpt. No cardiology consult at this time.  2. DM- ISS 3.  Hx of CVA/TIA-  Continue plavix at moment no need to add aspirin because no benefit.  4.  HTN- Continue home meds 5.  HLD-  Will get FLP continue crestor 6. FEN/GI: HHD CHO modified diet, SLIV 7 Prophylaxis: heparin and ppr 8 Disposition: pending work up but anticipate quick admission with close follow up.

## 2011-01-27 NOTE — Progress Notes (Signed)
  Subjective:    Patient ID: [name], female    DOB: 04/29/1956, 55 y.o.   MRN: [id number]  HPI  1. DM: Rx metformin 1000 mg in am and 1500 mg in pm, Hx of hypoglycemic episode (while on Lantus). Now on mealtime coverage, averaging 2-4 units AC. Patient checking BG QID. Usually 100s. A1c IMPROVED today. Met with nutritionist. No concerns re: DM today. Due for eye exam.   2. HTN: Rx: Norvasc, HCTZ, Accupril. Does not check at home.   3. HLD: Rx: Crestor. Denies myalgias.  4. Neck Pain: x a few weeks, intermittent, worse after working and with flexion/extension, better after rest. No radiation of pain, no CP, SOB, no N/V, no numbness/tingling, no injury or Hx of OP. Mom with OA.  She denies CP, SOB, N/V/D/C, HA, dizziness, numbness/tingling in hands/feet, LE edema, abdominal pain, dyspepsia, fever/chills, malaise.   PMHx: Benign Murmur      - Hochrein 6/03. 2D echo normal PMHx gastric ulcer TIA 2008- right arm/leg numbness.       - Negative MRI/MRA and head CT.  Neuro consulted, recommended starting Plavix. DM      - Two significant hypoglycemic episodes on Lantus (7 units) HTN HLD Anemia Postmenopausal  Review of Systems SEE HPI.    Objective:   Physical Exam General:  Well-developed, well-nourished, n no acute distress; alert, appropriate and cooperative throughout examination. Vitals reviewed. Lungs:  Normal respiratory effort, chest expands symmetrically. Lungs are clear to auscultation, no crackles or wheezes. Heart:  Reg Rate and Rhythm. No murmur. Pulses:  2+ DP. Extremities:  No edema. MSK: Neck with normal ROM. No TTP along spinus processes. Hypertonic mm right paraspinal - TTP. Normal shoulder ROM with no TTP. Normal UE strength and reflexes.  Diabetes Management Exam:     Foot Exam (with socks and/or shoes not present):       Sensory-Pinprick/Light touch:          Left medial foot (L-4): normal          Left dorsal foot (L-5): normal          Left lateral  foot (S-1): normal          Right medial foot (L-4): normal          Right dorsal foot (L-5): normal          Right lateral foot (S-1): normal       Sensory-Monofilament:          Left foot: normal          Right foot: normal       Inspection:          Left foot: normal          Right foot: normal       Nails:          Left foot: normal          Right foot: normal        Assessment & Plan:

## 2011-01-29 LAB — GLUCOSE, CAPILLARY: Glucose-Capillary: 138 mg/dL — ABNORMAL HIGH (ref 70–99)

## 2011-01-31 LAB — VITAMIN D 1,25 DIHYDROXY
Vitamin D 1, 25 (OH)2 Total: 54 pg/mL (ref 18–72)
Vitamin D3 1, 25 (OH)2: 18 pg/mL

## 2011-02-02 ENCOUNTER — Ambulatory Visit (INDEPENDENT_AMBULATORY_CARE_PROVIDER_SITE_OTHER): Payer: Self-pay | Admitting: Family Medicine

## 2011-02-02 ENCOUNTER — Encounter: Payer: Self-pay | Admitting: Family Medicine

## 2011-02-02 DIAGNOSIS — R079 Chest pain, unspecified: Secondary | ICD-10-CM | POA: Insufficient documentation

## 2011-02-02 NOTE — Patient Instructions (Signed)
It was nice to see you today!  I am sending you to a cardiologist for a stress test. We will call with the appointment.  Continue to take care of yourself.  If you can't afford Protonix, you can try over-the-counter Zantac or Prilosec.

## 2011-02-02 NOTE — Progress Notes (Signed)
  Subjective:    Patient ID: [name], female    DOB: 28-Aug-1956, 55 y.o.   MRN: [id number]  HPI  1. Hx Chest Pain: Patient presenting for hospital follow-up. She presented to [facility] with CP. Please see H&P for further details. She has Hx of DM, HTN, HLD, and Hx CVA. She has a FamHx of heart disease. CP R/O in the hospital was negative. CE were cycles and negative, TSH 1.521. Cholesterol 122, HDL 77, LDL 37. Last A1c 8.3. Her CP resolved while in ED, she was monitored overnight with no more episodes of CP. No episodes since leaving the hospital.   Review of Systems She denies CP, SOB, N/V/D/C, HA, dizziness, numbness/tingling in hands/feet, LE edema, abdominal pain, dyspepsia, fever/chills, malaise.     Objective:   Physical Exam  Vitals reviewed. Constitutional: She appears well-developed and well-nourished. No distress.  Cardiovascular: Normal rate, regular rhythm and intact distal pulses.   Pulmonary/Chest: Effort normal and breath sounds normal.  Neurological: She is alert.          Assessment & Plan:

## 2011-02-02 NOTE — Assessment & Plan Note (Signed)
Patient with no CP today. She has multiple cardiac risk factors, all controlled at this time. Will send to cardiology for formal evaluation and stress test.

## 2011-02-04 ENCOUNTER — Encounter: Payer: Self-pay | Admitting: *Deleted

## 2011-02-22 LAB — CBC
HCT: 33.8 % — ABNORMAL LOW (ref 36.0–46.0)
Hemoglobin: 11.6 g/dL — ABNORMAL LOW (ref 12.0–15.0)
MCHC: 34.2 g/dL (ref 30.0–36.0)
Platelets: 262 10*3/uL (ref 150–400)
RDW: 13.9 % (ref 11.5–15.5)

## 2011-02-22 LAB — BASIC METABOLIC PANEL
BUN: 13 mg/dL (ref 6–23)
CO2: 21 mEq/L (ref 19–32)
Glucose, Bld: 110 mg/dL — ABNORMAL HIGH (ref 70–99)
Potassium: 4 mEq/L (ref 3.5–5.1)
Sodium: 134 mEq/L — ABNORMAL LOW (ref 135–145)

## 2011-02-22 LAB — DIFFERENTIAL
Basophils Absolute: 0 10*3/uL (ref 0.0–0.1)
Basophils Relative: 0 % (ref 0–1)
Eosinophils Absolute: 0.1 10*3/uL (ref 0.0–0.7)
Eosinophils Relative: 1 % (ref 0–5)
Monocytes Absolute: 0.5 10*3/uL (ref 0.1–1.0)

## 2011-02-22 LAB — URINALYSIS, ROUTINE W REFLEX MICROSCOPIC
Bilirubin Urine: NEGATIVE
Ketones, ur: NEGATIVE mg/dL
Nitrite: NEGATIVE
Protein, ur: NEGATIVE mg/dL
Urobilinogen, UA: 0.2 mg/dL (ref 0.0–1.0)

## 2011-02-22 LAB — URINE MICROSCOPIC-ADD ON

## 2011-03-04 ENCOUNTER — Ambulatory Visit: Payer: Self-pay | Admitting: Cardiovascular Disease

## 2011-03-17 NOTE — Discharge Summary (Signed)
  [name], [name]               ACCOUNT NO.:  [id number]  MEDICAL RECORD NO.:  [id number]           PATIENT TYPE:  O  LOCATION:  6529                         FACILITY:  [facility]  PHYSICIAN:  Nestor Ramp, MD        DATE OF BIRTH:  Jan 26, 1956  DATE OF ADMISSION:  01/26/2011 DATE OF DISCHARGE:  01/27/2011                              DISCHARGE SUMMARY   REASON FOR HOSPITALIZATION:  Chest pain.  BRIEF HOSPITAL COURSE:  The patient is a 55 year old female who presented with chest pain acute onset that started 3 hours prior to arrival to the ER.  The pain was intermittent, sharp, and radiated down the left arm.  The pain resolved on its own, but the patient was admitted for chest pain evaluation and cardiac workup.  Cardiac enzymes point-of-care is negative.  Cardiac enzymes cycled x2, both sets were within normal limits.  TSH 1.521.  Lipid profile, cholesterol 122, HDL 77, LDL 37.  D-dimer less than 0.22.  BMET was within normal limits.The day after admission, the patient states the chest pain now resolved. In the setting of normal cardiac enzymes, the patient will be discharged with follow up with her PCP, Dr. Earlene Plater.  Dr. Earlene Plater will evaluate the patient on February 02, 2011, at 9:30 a.m.  We will defer to Dr. Earlene Plater to decide if the patient needs further cardiac workup or referral to Cardiology.  DISCHARGE DIAGNOSES: 1. Chest pain. 2. Diabetes. 3. Hyperlipidemia. 4. Obesity. 5. Anemia. 6. Smokeless tobacco abuse. 7. Hypertension. 8. History of TIA. 9. History of diverticulosis.  DISCHARGE MEDICATIONS: 1. Metformin 1000 mg by mouth b.i.d. 2. Plavix 75 mg 1 tablet by mouth daily. 3. NovoLog sliding scale before meals 2-4 units subcu 3 times a day,     please use per sliding scale regimen given by PCP. 4. Norvasc 10 mg 1/2 tablet by mouth daily. 5. Hydrochlorothiazide 25 mg 1 tablet by mouth daily. 6. Crestor 1 tablet by mouth daily. 7. Aspirin 81 mg 1 tablet by mouth  daily. 8. Accupril 40 mg 1 tablet by mouth daily. 9. Protonix 40 mg by mouth daily. 10.Nitroglycerin 0.4 mg tablets sublingual q.5 minutes as needed up to     3 doses.  DISCHARGE FOLLOWUP:  The patient is to follow up with Dr. Earlene Plater on Wednesday, February 02, 2011, at 9:30 a.m. at [facility].     Ellin Mayhew, MD   ______________________________ Nestor Ramp, MD    DC/MEDQ  D:  01/27/2011  T:  01/28/2011  Job:  [id number]  Electronically Signed by Ellin Mayhew  on 03/01/2011 02:21:05 PM Electronically Signed by Denny Levy MD on 03/17/2011 09:29:51 AM

## 2011-03-17 NOTE — H&P (Signed)
[name], [name]               ACCOUNT NO.:  [id number]  MEDICAL RECORD NO.:  [id number]           PATIENT TYPE:  O  LOCATION:  6529                         FACILITY:  [facility]  PHYSICIAN:  Dr. Denny Levy          DATE OF BIRTH:  1956-08-25  DATE OF ADMISSION:  01/26/2011 DATE OF DISCHARGE:                             HISTORY & PHYSICAL   PRIMARY CARE PHYSICIAN:  Monica Rima, MD at [facility].  CHIEF COMPLAINT:  Chest pain.  HISTORY OF PRESENT ILLNESS:  [name] is a 55 year old female with past medical history that is significant for TIA, diabetes, hyperlipidemia, obesity, smokeless tobacco use who presents with acute onset of chest pain approximately 3 hours ago was in bed sharp, intermittent, 5 seconds, resolved on its own.  States that it was substernal, radiates down the left arm as high as 5-6/10 pain, now with 0/10 pain.  The patient has never had this pain before, no association with food or deep breathing, nonreproducible.  The patient denies any fevers, chills, nausea, vomiting, abdominal pain, dysuria, chest pain, shortness of breath, dyspnea on exertion or numbness in the extremities.  PAST MEDICAL HISTORY:  Significant for; 1. Diabetes type 2. 2. Hyperlipidemia. 3. Obesity. 4. Anemia. 5. The patient does snuff tobacco. 6. Cataracts. 7. Hypertension. 8. Transient ischemic attack back in 2008. 9. Diverticulosis.  PAST SURGICAL HISTORY: 1. Bilateral tubal ligation in 1988. 2. Benign right and left breast lumpectomies in the 1970s. 3. Endometrial biopsy, no hyperplasia. 4. EKG normal in 2005. 5. Colonoscopy in 2005 normal except for diverticulosis.  SOCIAL HISTORY:  She lives with her youngest daughter.  Denies history of smoking but does use snuff, has a history of excessive alcohol use, reports only two beers weekly at this time.  The patient states that she was not drinking at this time.  Denies any other drugs.  FAMILY  HISTORY:  Significant for alcohol abuse.  Mother has diabetes. Father, congestive heart failure.  The patient's sisters with hypertension and diabetes.  Brother has diabetes and gout.  ALLERGIES:  No known drug allergies.  MEDICATIONS:  The patient's daily medications include; 1. Amlodipine 10 mg daily. 2. Plavix 75 mg daily. 3. Flexeril at this moment just 1 tablet every 8 hours as needed for     muscle spasm, just received on the 9 of this month. 4. Hydrochlorothiazide 25 mg daily. 5. NovoLog, using sliding scale for breakfast, lunch, and dinner. 6. Metformin 1000 mg b.i.d. 7. Accupril 40 mg daily. 8. Simvastatin 10 mg nightly. 9. Trazodone 50 mg at bedtime as needed for insomnia.  PERTINENT LABS:  The patient had chest x-ray done, no acute process. The patient's EKG is sinus tachycardia with no abnormalities.  No changes from previous EKG taken in 2010.  The patient's D-dimer is 0.22. The patient did have a BMET done which showed a sodium of 137, potassium of 3.7, chloride of 100, CO2 of 30, BUN of 11, creatinine of 0.7 and glucose of 161.  Lab results showed a white blood cell count of 5.3, hemoglobin 11.2, hematocrit of 34.4,  MCV of 87.1, and platelets of 212.  PHYSICAL EXAMINATION:  VITAL SIGNS:  The patient's pulse 99, blood pressure 136/79, respirations 18, 100% on room air, and temperature 98.5. GENERAL:  An alert and cooperative.  Appears stated age.  No apparent distress. HEENT:  Pupils equal, round, and reactive to light.  Extraocular movements intact.  Poor dentition down. HEART:  Regular rate and rhythm.  No murmur heard.  The patient's chest pain is not reproducible on palpation. LUNGS: Clear to auscultation bilaterally. STOMACH:  Bowel sounds positive, nontender, nonguarding, nondistended. EXTREMITIES:  Normal. NEUROVASCULAR:  Intact.  No edema or cyanosis stated.  The patient has 5/5 strength in all extremities.  Mental status and speech are normal. Alert and  oriented x3.  ASSESSMENT AND PLAN:  [name] is a 55 year old female with a past medical history that is significant for diabetes, TIA, hyperlipidemia, and hypertension who is presenting with onset of atypical/typical chest pain. 1. Chest pain, chest pain is substernal with radiation last seconds     but was at rest, went away on its own, nonreproducible.  The     patient does have a TIMI score of 1, the patient is at risk due to     her diabetes, we will monitor on tele, cycle enzymes, 8 hours apart     x2,  start Protonix.  I think this could be also GI in nature given     nitro if needed.  EKG in the morning.  The patient does have     anxiety.  There is a possibility losing her job in the near future     that could be contributing to this as long as the patient does well     cardiac enzymes were normal.  EKG shows no changes.  We will have     the patient followup with PCP in the near future and get a stress     test schedule outpatient, no cardiology consult needed at this     time.  D-dimer was normal which ruled out pulmonary embolism.  We     will risk stratify with fasting lipid panel, TSH, vitamin D levels     as well. 2. Diabetes with insulin sliding scale. 3. History of cerebrovascular accident and transient ischemic attack,     the patient is on Plavix.  We will continue that, no need to start     aspirin because it does not make any difference or benefit. 4. Hypertension.  Continue home meds. 5. Hyperlipidemia.  We will get fasting lipid panel and continue     Crestor. 6. Fluids, electrolytes, nutrition, gastroenterology.  Heart healthy     diet, carb modified.  We will saline lock IV. 7. Prophylaxis.  Heparin and a proton pump inhibitor. 8. Disposition.  This be pending further workup likely this will be a     quick admission and we will have a followup in the outpatient     setting.     Antoine Primas, DO   ______________________________ Dr. Denny Levy    ZS/MEDQ  D:  01/27/2011  T:  01/27/2011  Job:  [id number]  Electronically Signed by Antoine Primas  on 01/31/2011 01:22:18 PM Electronically Signed by Denny Levy MD on 03/17/2011 09:29:35 AM

## 2011-03-29 ENCOUNTER — Encounter: Payer: Self-pay | Admitting: Cardiovascular Disease

## 2011-03-31 ENCOUNTER — Ambulatory Visit: Payer: Self-pay | Admitting: Cardiovascular Disease

## 2011-04-01 NOTE — H&P (Signed)
[name], [name]               ACCOUNT NO.:  [id number]   MEDICAL RECORD NO.:  [id number]          PATIENT TYPE:  INP   LOCATION:  3009                         FACILITY:  [facility]   PHYSICIAN:  Wayne A. Sheffield Slider, M.D.    DATE OF BIRTH:  02/20/1956   DATE OF ADMISSION:  02/11/2007  DATE OF DISCHARGE:                              HISTORY & PHYSICAL   CHIEF COMPLAINT:  Right-sided numbness and weakness.   PRIMARY CARE PHYSICIAN:  __________  at [facility].   HISTORY OF PRESENT ILLNESS:  This is a 55 year old African American  female with diabetes, hypertension and hyperlipidemia who presents after  onset of right-sided weakness and numbness this morning.  The patient  reports going to bed around 3:30 a.m. this morning, at which point she  was fine and in her normal state of health.  The patient then awoke  around 7 o'clock this morning to use the bathroom, at which point she  noticed weakness in her right arm and right leg, greatest in her arm.  She also noticed associated right-sided facial numbness.  The patient  denied any associated headache, vision changes, neck pain, tenderness,  slurred speech, confusion, fall or loss of consciousness.  Upon arrival  to the emergency department around 9:30 a.m. this morning,  the  patient's CBG was found to be in the 30s.  The patient's symptoms have  now completely resolved status post receiving 1 ampule of D50.  The  patient denies any hypoglycemic symptoms associated with her low blood  sugar such as dizziness, shakiness, diaphoresis or nausea.  The patient  does report occasional low CBGs at home in the 40s to 50s for which she  is symptomatic.  She has recently run out of her test strips and lancets  and not been checking her home CBGs.  The patient denies missing any  meals and reports eating three regular meals daily.  She also denies any  recent illness.   REVIEW OF SYSTEMS:  CONSTITUTIONAL:  No fever, chills or  fatigue.  CARDIOVASCULAR:: Denies shortness of breath or chest pain.  PULMONARY:  Denies cough or sore throat.  GI: Denies nausea, vomiting, diarrhea,  bright red blood per rectum or melena.  GU: Does report new onset  urinary hesitancy/retention this morning, denies hematuria or dysuria.  SKIN: Denies rash.  NEURO: Denies tinnitus, vertigo or recent falls or  loss of consciousness.  Remainder of review of systems is unremarkable.   PAST MEDICAL HISTORY:  1. Type 2 diabetes, uncomplicated.  2. Hyperlipidemia.  3. Hypertension.  4. Metrorrhagia.  5. Iron deficiency anemia.  6. History of gastric ulcer.  7. Status post bilateral tubal ligation in 1988.  8. Status post benign right and left breast lumpectomies in the 1970s  9. Status post endometrial biopsy which showed proliferative      endometrium with no hyperplasia or malignancy.  10.Status post EGD which was normal in 2005.  11.Status post colonoscopy in 2005 which was normal except for      diverticulosis.   HOME MEDICATIONS:  1. Accupril 40 mg  p.o. daily.  2. Actos 30 mg p.o. daily  3. Aspirin 81 mg p.o. daily.  4. Crestor 10 mg p.o. daily.  5. Ferrous sulfate 325 mg p.o. t.i.d.  6. Glucophage 1000 mg p.o. b.i.d.  7. Glucotrol XL 20 mg p.o. daily.  8. HCTZ 25 mg p.o. daily.  9. Over-the-counter Aleve as needed for tension headache   ALLERGIES:  No known drug allergies.   FAMILY HISTORY:  Family history is significant for alcohol abuse.  The  patient's mother has diabetes.  Father has congestive heart failure.  The patient has sisters with both hypertension and diabetes.  The  patient's brother has diabetes and gout.  The patient denies family  history of stroke.   SOCIAL HISTORY:  The patient currently lives with her youngest daughter  in [place].  Denies any history of smoking but does report snuff use.  The patient has a history of excessive alcohol use, currently reports  only two beers weekly.  Denies any  other drug use.   PHYSICAL EXAM:  VITAL SIGNS: Temperature is 97.4, heart rate 102,  respiratory rate 24, blood pressure 145/64, O2 sat 96% on room air.  GENERAL APPEARANCE:  The patient is in no acute distress.  Mental status  is alert and oriented x3.  HEAD is normocephalic and atraumatic with extraocular movements intact.  Pupils are equal, round, reactive to light and accommodation.  Right  tympanic membrane is occluded with cerumen.  Left tympanic membrane is  clear.  Oropharynx shows moist mucous membranes, no erythema or  exudates. CHEST: symmetric movement bilaterally.  Lungs are clear to  auscultation bilaterally with normal work of breathing and no wheezes,  rales or rhonchi.  HEART  is regular rate and rhythm with normal S1-S2.  No carotid bruits  and 2+ dorsalis pedis pulses bilaterally.  ABDOMEN  has normoactive bowel sounds, is soft, nontender, nondistended.  EXTREMITIES:  Have no clubbing, cyanosis or edema.  Pulses are 2+  distally.  RECTAL EXAM revealed normal sphincter tone and is Hemoccult negative.  NEUROLOGICAL: Face is symmetric bilaterally.  Cranial nerves II-XII are  grossly intact.  The patient has no visual field defects.  The patient  has 5/5 strength bilaterally in all four extremities.  DTRs are 2+ and  symmetric bilaterally with no clonus.  Cerebellar function is intact.  The patient has normal sensation throughout.  SKIN: Shows no evidence of rash or petechiae.   LABORATORY DATA:  PT is 13.0.  INR is 1.0.  I-STAT reveals sodium 139,  potassium 4.0, chloride of 106, bicarb of 25.4, BUN of 10, creatinine of  0.8 and glucose of 36.  Patient's glucose returned to 100 after an amp  of D50.   EKG shows normal sinus rhythm with no acute changes.   Head CT without contrast is negative for acute intracranial processes.   ASSESSMENT/PLAN:  This is a 55 year old African American female with diabetes, hypertension and hyperlipidemia who presents with transient   episode of right-sided numbness and weakness which has since resolved.   PROBLEM LIST:  1. Neurological:  The patient's right sided numbness and weakness is      possibly secondary to TIA given resolution of symptoms versus      hypoglycemia given improvement of symptoms after an amp of D50.      Neurology was consulted and is concerned for possibility of TIA      given lack of associated hypoglycemic symptoms.  The patient was  previously on aspirin 81 mg at home and Neurology has thus      recommended changing to Plavix.  Neurology also recommends      obtaining MRI and MRA of brain, carotid Dopplers, 2-D echo, fasting      lipid panel, hemoglobin A1c and homocystine level.  Neuro will      continue to follow during hospitalization.  The patient denies      history of prior TIA, CVA, or similar symptoms.  2. Diabetes and hypoglycemia:  The patient has not been checking home      CBGs  regularly.  Denies skipping any meals.  The patient does have      history of alcohol abuse, however, thus we will check alcohol      level.  Will hold oral hypoglycemics for now and place on sensitive      sliding scale insulin coverage.  We will restart oral hypoglycemics      gradually if CBGs are significantly elevated.  Will check      hemoglobin A1c in a.m.  3. Urinary hesitancy/retention:  New onset this morning.  The patient      is unable to use bedpan.  Will follow the patient after she is able      to use private restroom on the floor.  If urinary retention remains      we will consider Foley placement.  The patient denies any recent      use of over-the-counter anticholinergic such as Benadryl.  4. Hypertension:  The patient's blood pressure is currently relatively      stable.  Will continue home meds of Accupril and HCTZ.  5. Hyperlipidemia:  Will continue home dose of Crestor for now and      check fasting lipid panel in the morning.  6. History of iron deficiency anemia:  The patient  is hemoccult      negative.  She is still menstruating thus has probable cause for      iron deficiency, also had a negative colonoscopy in 2005.  Will      hold the patient's iron sulfate for now and check CBC in a.m..  7. FEN/GI:  The patient has been eating already in the ED without      problems.  Will place on healthy ADA diet as tolerated.  Will KVO      IV fluids.  The patient's electrolytes are currently stable.  Will      recheck BMP in a.m.  8. Prophylaxis:  Will place SCDs bilaterally to lower extremities.  9. Disposition:  Is pending of extensive workup as per #1.     ______________________________  [name], M.D.    ______________________________  Arnette Norris. Sheffield Slider, M.D.    EE/MEDQ  D:  02/11/2007  T:  02/11/2007  Job:  [id number]

## 2011-04-01 NOTE — Assessment & Plan Note (Signed)
[facility]                         GASTROENTEROLOGY OFFICE NOTE   SACHA, TOPOR                      MRN:          [id number]  DATE:03/13/2007                            DOB:          1956-06-18    HISTORY OF PRESENT ILLNESS:  [name] presents today for follow up.  She  is a 55 year old with history of diabetes, hypertension, hyperlipidemia  and degenerative joint disease who was evaluated December 12, 2006, for  recurrent profound iron deficiency anemia.  See that dictation for  details.  The patient had a tissue transglutaminase antibody obtained.  This returned normal.  She subsequently underwent upper endoscopy  December 20, 2006.  This was entirely normal.  She was placed on iron  three times daily which she has been compliant.  She brings with her  today hemoccult cards times 6 which we have developed.  They are all  negative for occult blood.  She states she is feeling better since  continuing iron therapy.  She denies being a blood donor.  She continues  to have regular normal menstrual periods each month.  No active GI  symptoms or complaints.  She did have colonoscopy and upper endoscopy in  2005 for the same problem.  No significant abnormalities found.   CURRENT MEDICATIONS:  1. Crestor.  2. Plavix.  3. Metformin.  4. Accupril.  5. Actos.  6. Iron.  7. Alleve p.r.n.   PHYSICAL EXAMINATION:  GENERAL:  Well-appearing female in no acute  distress.  VITAL SIGNS:  Blood pressure 156/80, heart rate 88, weight 189.4 pounds.  ABDOMEN:  Entirely benign.   IMPRESSION:  Recurrent iron deficiency anemia.  No evidence of GI blood  loss by history of physical exam.  Stool studies negative as well.  I  suspect her chronic recurrent blood loss is due to menstruation.   RECOMMENDATIONS:  1. Continue iron therapy indefinitely.  2. Follow up CBC today.  3. Return to the care of Dr. Mannie Stabile.     [name]. Marina Goodell, MD  Electronically Signed   JNP/MedQ  DD: 03/13/2007  DT: 03/13/2007  Job #: [id number]   cc:   Towana Badger, M.D.

## 2011-04-01 NOTE — Consult Note (Signed)
[name], [name]               ACCOUNT NO.:  [id number]   MEDICAL RECORD NO.:  [id number]          PATIENT TYPE:  EMS   LOCATION:  MAJO                         FACILITY:  [facility]   PHYSICIAN:  Bevelyn Buckles. Champey, M.D.DATE OF BIRTH:  1956-07-28   DATE OF CONSULTATION:  DATE OF DISCHARGE:                                 CONSULTATION   REQUESTING PHYSICIAN:  Dr. Deretha Emory, family practice.   REASON FOR CONSULTATION:  Transient ischemic attack.   HISTORY OF PRESENT ILLNESS:  [name] is a 55 year old African-  American female with multiple medical problems who presented this  morning after waking around 7 a.m. with right sided weakness, upper  greater than lower extremity, and numbness.  Patient states she went to  sleep around 3:30 a.m. and woke up around 7 a.m. with these symptoms.  The patient also felt that she had some right facial numbness and was  unsteady on her feet.  Her CBG at that time was in the 30s and was given  D50 and her symptoms resolved.  It lasted a few hours in duration.  She  is now back to her baseline.  She also states she has been having  problems voiding.  She has no prior stroke-like symptoms in the past.  She denies any headaches, neck pain, vision changes, speech changes,  swallowing problems, chewing problems, vertigo, dizziness, falls or loss  of consciousness.   PAST MEDICAL HISTORY:  Positive for hypertension, high cholesterol.   CURRENT MEDICATIONS:  Include:  1. Accupril.  2. Hydrochlorothiazide.  3. Metformin.  4. Glucotrol.  5. Actos.  6. Crestor.  7. Aspirin.  8. Iron sulfate.   ALLERGIES:  Patient has no known drug allergies.   FAMILY HISTORY:  Positive for diabetes.   SOCIAL HISTORY:  Patient lives with daughter.  Has a history of alcohol  use, now drinks 2 beers per week as per patient.  Patient also dips  snuff.   REVIEW OF SYSTEMS:  Positive as per HPI and review of systems negative  as her HPI and greater than 8 other organ  systems.   PHYSICAL EXAMINATION:  VITALS:  Temperature is 97.4, blood pressure is  145/64, pulses is 102, respirations 24, O2 sat is 96%.  HEENT:  Normocephalic/atraumatic, extraocular muscles are intact, pupils  equal, round, and reactive to light.  Neck is supple, no carotid bruits.  HEART:  Regular.  LUNGS:  Clear.  ABDOMEN:  Soft.  EXTREMITIES:  Show the pulses.  NEUROLOGICAL:  The patient is awake, alert, following commands  appropriately.  Cranial nerves II through XII are grossly intact.  Motor  examination shows 5/5 strength and normal tone in all 4 extremities.  No  drift is noted.  Sensory examination is within normal limits to light  touch.  Reflexes are trace 1+ throughout toes and neutral bilaterally.  Cerebellar function is within normal limits finger to nose.  Gait is not  assessed secondary to safety.  CT of the head showed no acute bleed or  infarct.   LABS:  Hemoglobin 11.6, hematocrit is 34, PT is 13, INR is 1,  sodium is  139, potassium is  4, chloride is 106, CO2 is 27, BUN 10, creatinine  0.8, glucose 36.   IMPRESSION:  This is a 55 year old African-American female with  transient ischemic attack and hypoglycemia who is now currently back to  baseline.  Patient will not begin any therapy, TPA, as symptoms have  completely resolved.  Will recommend admitting the patient and will  check an MRI/MRA of the brain, carotid Dopplers, 2D echocardiogram;  check hemoglobin A1c, fasting lipids and homocysteine level.  Will  change her aspirin to Plavix, get a physical therapy and occupational  therapy consult.  Will follow the patient while she is in the hospital  on the stroke consult service.      Bevelyn Buckles. Nash Shearer, M.D.  Electronically Signed     DRC/MEDQ  D:  02/11/2007  T:  02/11/2007  Job:  [id number]

## 2011-04-01 NOTE — Discharge Summary (Signed)
[name], [name]               ACCOUNT NO.:  [id number]   MEDICAL RECORD NO.:  [id number]          PATIENT TYPE:  INP   LOCATION:  3009                         FACILITY:  [facility]   PHYSICIAN:  Alanda Amass, M.D.   DATE OF BIRTH:  27-Nov-1955   DATE OF ADMISSION:  02/11/2007  DATE OF DISCHARGE:  02/13/2007                               DISCHARGE SUMMARY   DISCHARGE DIAGNOSES:  Include:  1. Transient ischemic attack.  2. Hypoglycemia.  3. Alcohol abuse.  4. Diabetes.  5. Hypertension.  6. Hyperlipidemia.  7. Anemia.   CONSULTS:  Include stroke team consult service, Dr. Nash Shearer and a  physical therapy consult.   PRIMARY CARE PHYSICIAN:  Dr. Linden Dolin at [facility].   PROCEDURES AND STUDIES DONE:  Include:  1. An MRI/MRA of the brain on March 30, that showed normal      intracranial angiography and normal MRI.  2. Head CT was negative, noncontrast head CT.  3. Preliminary results from the carotid Dopplers were normal.  4. Echocardiogram showed an overall normal left ventricular systolic      function.   DISCHARGE MEDICATIONS:  Include:  1. Plavix 75 mg 1 tablet daily.  2. Crestor 10 mg by mouth daily.  3. Accupril 40 mg daily for blood pressure.  4. Ferrous sulfate 325 mg by mouth 3 times a day.  5. Metformin 1000 mg 3 times a day.  6. Actos 30 mg by mouth daily.   Patient was given an prescription for the Plavix.  She is to stop taking  Glucotrol and aspirin.  She will follow up with Dr. Mannie Stabile about this.   FOLLOWUP:  Was with Dr. Linden Dolin at the [facility].  Patient said she will call and make an appointment this week.   DISCHARGE CONDITION:  Improved and stable.   LABS:  Include discharge BMP:  Sodium 134, potassium 3.5, chloride 101,  CO2 of 24, glucose 162, BUN 5, creatinine 0.59.  Discharge CBC:  White  blood cell count 3.0, hemoglobin 9.7, hematocrit 31.1, platelets 245.  Homocystine level 6.4, within normal limits.   Ferritin 13, hemoglobin  A1c 6.9.  Lipid profile:  Total cholesterol of 122, triglycerides 44,  HDL 63, LDL 50, VLDL 9.  Alcohol level, on admission, was 71.  Patient  was fecal occult blood negative x1.  Her PT and INR were within normal  limits.   HOSPITAL COURSE:  The patient is a 55 year old African American female  with diabetes, hypertension and hyperlipidemia who presented with right-  sided numbness and weakness, possibly due to a TIA versus hypoglycemia  versus alcoholism or a combination of these three.   1. Neuro.  Her right-sided numbness and weakness has now resolved.      She has not had any since admission.  Stroke team did come to see      her and recommended stopping her aspirin and starting her on      Plavix, which we did.  She will continue the Plavix until      determined  otherwise by her primary care physician.  Her      echocardiogram, MRI/MRA of her brain and her head CT were all      within normal limits.  On admission, the patient's blood sugars was      in the 30s and it is felt that she possibly had a neuroglycopenia      TIA; however, she did not exhibit some other symptoms of      hyperglycemia that would have been expected, so the stroke team      feels that she should be continued on the Plavix, at least for now.      She should also have strict control of her diabetes and her      hypertension.  2. Diabetes.  Patient was initially hypoglycemic on admission, which      probably was due to her alcohol intake.  She does have a rather      good hemoglobin A1c of 6.9, so she was relatively well controlled      on her Actos, metformin and Glucotrol.  Due to her elevated sugars,      now between 162-262, 162 being fasting, we will go ahead and add      back her Actos and metformin and she will start taking these once      she leaves the hospital.  However, we will not add back the      Glucotrol at this point since this could cause hypoglycemia more       readily.  She was also advised not to continue her alcohol use.  3. Hypertension.  Patient will be continued on her Accupril at home      dose.  4. Hyperlipidemia.  Patient is to continue her Crestor.  Her fasting      lipid panel showed a great LDL of less than 70, so this medication      does not need to be increased.  5. Anemia.  Patient is still menstruating, although she is 29-years-      old.  She is on iron at home.  Her ferritin here was on the low      side indicating likely iron deficiency anemia.  Her fecal occult      blood test was negative.  She will be continued on her ferrous      sulfate at home 3 times a day.  6. Alcohol intake.  Patient was encouraged to stop any alcohol intake      since this could have caused her TIA and hypoglycemia.  She was      advised to go to Alcoholics Anonymous meetings if having trouble      stopping on her own.  There were not any withdrawal symptoms noted      of her alcoholism while in the hospital.   DISPOSITION:  The patient will be discharged home today.   FOLLOWUP ISSUES:  Patient will need to follow up with Dr. Linden Dolin  regarding when to stop her Plavix, as well as if she should be put back  on her Glucotrol.           ______________________________  Alanda Amass, M.D.     JH/MEDQ  D:  02/13/2007  T:  02/13/2007  Job:  [id number]   cc:   Towana Badger, M.D.

## 2011-04-01 NOTE — Assessment & Plan Note (Signed)
[facility]                         GASTROENTEROLOGY OFFICE NOTE   [name], [name]                      MRN:          [id number]  DATE:12/12/2006                            DOB:          1956/10/05    REFERRING PHYSICIAN:  Towana Badger, M.D.   REASON FOR CONSULTATION:  Iron deficiency anemia.   HISTORY:  This is a 55 year old African American female with a history  of diabetes, hypertension, hyperlipidemia, and degenerative joint  disease, who is referred through the courtesy of Dr. Mannie Stabile regarding  profound anemia.  She was recently evaluated and routine laboratories  revealed a hemoglobin of 5.5 with a mean corpuscular volume of 60.  She  has been evaluated in this office previously for similar problems.  Specifically, she was evaluated in March of 2005 for nausea, weight loss  and dysphagia.  At that time she was found to be anemic with a  hemoglobin of 8.0.  She had an elevated sedimentation rate of 57.  Her  MCV was low at 67.5.  She underwent complete colonoscopy and upper  endoscopy February 03, 2004 and January 28, 2004 respectively.  Upper  endoscopy was entirely normal.  Though she had a prior history of ulcer  disease and active use of aspirin and nonsteroidal anti-inflammatory  drugs, no evidence of such.  Complete colonoscopy including intubation  of the terminal ileum was normal except for diverticulosis.  She has not  been seen since.  The patient continues to use the Aleve for joint aches  and aspirin.  She denies nausea, vomiting, abdominal pain, change in  bowel habits, melena or hematochezia.  She has not submitted stool cards  for analysis.  Her renal function is normal.  Her uterus is intact.   ALLERGIES:  No known drug allergies.   CURRENT MEDICATIONS:  1. Elavil 25 mg daily.  2. Aspirin 325 mg daily.  3. Glucophage 500 mg b.i.d.  4. Glucotrol 10 mg daily.  5. Hydrochlorothiazide 12.5 mg daily.  6. Mevacor 20 mg at night.  7. Vasotec 20 mg daily.  8. Crestor 10 mg at night.  9. Iron supplement once daily.  10.Aleve daily.   PAST MEDICAL HISTORY:  1. Hypertension.  2. Hyperlipidemia.  3. Type 2 diabetes.  4. Osteoarthritis.  5. Gastric ulcer.   PAST SURGICAL HISTORY:  1. Tubal ligation.  2. Breast lumpectomies.  3. Endometrial biopsy.   FAMILY HISTORY:  Negative for GI malignancy or anemia.   SOCIAL HISTORY:  The patient is single with three children.  She lives  in [place], works as a Advertising copywriter at [employer], does not  smoke.  Has a history of alcohol abuse, dips snuff.   PHYSICAL EXAMINATION:  Well-appearing female in no acute distress.  Blood pressure is 132/72, heart rate is 60 and regular, weight is 180.4  pounds.  HEENT:  Sclerae are anicteric, conjunctivae are pink, oral mucosa is  intact, no adenopathy.  LUNGS:  Clear.  HEART:  Regular.  ABDOMEN:  Soft without tenderness, mass or hernia.  Good bowel sounds  heard.  EXTREMITIES:  Are without edema.  IMPRESSION:  55-year-old female with symptomatic profound  microcytic anemia consistent with iron deficiency (ferritin less than  10).  Prior colonoscopy and upper endoscopy to evaluate the same.   RECOMMENDATIONS:  1. Submit Hemoccult cards x6.  2. Increase iron to 3 times daily.  3. Schedule upper endoscopy to rule out ulcer or erosive disease.  4. Consider capsule endoscopy if stool cards positive and endoscopy      negative.  5. Tissue transglutaminase antibody to exclude iron absorptive      problem.  6. Query the patient more closely on menstrual blood loss or a history      of blood donation.     Wilhemina Bonito. Marina Goodell, MD  Electronically Signed    JNP/MedQ  DD: 12/12/2006  DT: 12/13/2006  Job #: [id number]   cc:   Towana Badger, M.D.

## 2011-04-14 ENCOUNTER — Telehealth: Payer: Self-pay | Admitting: Family Medicine

## 2011-04-14 NOTE — Telephone Encounter (Signed)
Pt wants to know what she can take OTC for a cough, pt is diabetic.

## 2011-04-14 NOTE — Telephone Encounter (Signed)
Patient advised after consulting with Dr. Leveda Anna that she can take Robitussin DM. States she has a dry hacking cough that started 2 days ago. Non productive, no fever. Call back if symptoms worsen.

## 2011-04-19 ENCOUNTER — Ambulatory Visit (INDEPENDENT_AMBULATORY_CARE_PROVIDER_SITE_OTHER): Payer: Self-pay | Admitting: Cardiovascular Disease

## 2011-04-19 ENCOUNTER — Encounter: Payer: Self-pay | Admitting: Cardiovascular Disease

## 2011-04-19 DIAGNOSIS — R079 Chest pain, unspecified: Secondary | ICD-10-CM

## 2011-04-19 NOTE — Assessment & Plan Note (Signed)
Her chest pain was atypical and has not recurred over last 10 weeks. Her risk factors are well controlled. I do not think any stress testing is necessary at this time. She does have a slight murmur but she does not wish to pursue an echo at this time. No further workup. I have encouraged a healthy low fat diet and daily exercise. She will call us if she has any recurrence of chest pain.

## 2011-04-19 NOTE — Progress Notes (Signed)
History of Present Illness:55 yo AAF with history of DM, HTN, HL, smokeless tobacco abuse, family history of CAD who is here for evaluation of chest pain. She tells me that in March she took a new medication and had onset of chest pain. She was admitted to [facility] on January 26, 2011 and ruled out for an MI with serial cardiac enzymes. D-dimer was negative. She has had no recurrent chest pain or SOB. She feels well. 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 No limitation in her ability to work. Her energy level is normal.   Past Medical History  Diagnosis Date  . Diabetes mellitus   . Hyperlipidemia   . Hypertension   . Anemia   . Stroke     2008 - right arm/leg numbness.     . Benign heart murmur   . Gastric ulcer   . History of colonoscopy     Past Surgical History  Procedure Date  . Endometrial biopsy     Hx of endometrial Bx 2004 - negative for hyperplasia or malignancy. Patient does not remember reason for testing.  . Colonoscopy 01/13/2004    Colonoscopy - normal, diverticulosis   . Btl 1988  . L and r breast lumpectomies benign 1970  . Esophagogastroduodenoscopy     normal    Current Outpatient Prescriptions  Medication Sig Dispense Refill  . amLODipine (NORVASC) 10 MG tablet Take 10 mg by mouth daily.        . clopidogrel (PLAVIX) 75 MG tablet Take 75 mg by mouth daily.        . cyclobenzaprine (FLEXERIL) 5 MG tablet Take 1 tablet (5 mg total) by mouth every 8 (eight) hours as needed for muscle spasms.  30 tablet  1  . glucose blood test strip Freestyle Freedom Strips. Check sugars 4 times a day.       . hydrochlorothiazide 25 MG tablet Take 25 mg by mouth daily.        . insulin aspart (NOVOLOG) 100 UNIT/ML injection Inject 2-4 units per sliding scale with breakfast, lunch, and dinner.      . Insulin Syringe-Needle U-100 (B-D INS SYR ULTRAFINE .5CC/30G) 30G X 1/2" 0.5 ML MISC daily use of insulin 1 box - supply for one month       . Lancets Fine 28G MISC Check  sugars twice a day       . meloxicam (MOBIC) 15 MG tablet Take 1 tablet (15 mg total) by mouth daily.  30 tablet  2  . metFORMIN (GLUCOPHAGE) 1000 MG tablet Take 1 tablet by mouth each morning and night      . quinapril (ACCUPRIL) 40 MG tablet Take 40 mg by mouth daily.       . rosuvastatin (CRESTOR) 10 MG tablet Take 10 mg by mouth daily.        Marland Kitchen DISCONTD: traZODone (DESYREL) 50 MG tablet Take 50 mg by mouth at bedtime as needed. For insomnia         No Known Allergies  History   Social History  . Marital Status: Single    Spouse Name: N/A    Number of Children: 3  . Years of Education: N/A   Occupational History  . 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 Smoking status: Never Smoker   . Smokeless tobacco: Current User    Types: Snuff  . Alcohol Use: Yes  hx excessive ETOH, quit 02/2007  . Drug Use: No  . Sexually Active: Not Currently   Other Topics Concern  . Not on file   Social History Narrative  . No narrative on file    Family History  Problem Relation Age of Onset  . Diabetes Mother   . Alcohol abuse Father   . Heart disease Father   . Diabetes Sister   . Heart disease Sister     Review of Systems:  As stated in the HPI and otherwise negative.   BP 137/83  Pulse 92  Resp 14  Ht 5\' 4"  (1.626 m)  Wt 145 lb (65.772 kg)  BMI 24.89 kg/m2  Physical Examination: General: Well developed, well nourished, NAD HEENT: OP clear, mucus membranes moist SKIN: warm, dry. No rashes. Neuro: No focal deficits Musculoskeletal: Muscle strength 5/5 all ext Psychiatric: Mood and affect normal Neck: No JVD, no carotid bruits, no thyromegaly, no lymphadenopathy. Lungs:Clear bilaterally, no wheezes, rhonci, crackles Cardiovascular: Regular rate and rhythm. Slight systolic  Murmur. No  gallops or rubs. Abdomen:Soft. Bowel sounds present. Non-tender.  Extremities: No lower extremity edema. Pulses are 2 + in the bilateral  DP/PT.  EKG:NSR, rate 92 bpm. Normal EKG.

## 2011-05-12 ENCOUNTER — Encounter: Payer: Self-pay | Admitting: Family Medicine

## 2011-05-12 ENCOUNTER — Ambulatory Visit (INDEPENDENT_AMBULATORY_CARE_PROVIDER_SITE_OTHER): Payer: Self-pay | Admitting: Family Medicine

## 2011-05-12 VITALS — BP 102/78 | HR 84 | Temp 98.2°F | Ht 64.0 in | Wt 148.4 lb

## 2011-05-12 DIAGNOSIS — E119 Type 2 diabetes mellitus without complications: Secondary | ICD-10-CM

## 2011-05-12 DIAGNOSIS — E785 Hyperlipidemia, unspecified: Secondary | ICD-10-CM

## 2011-05-12 DIAGNOSIS — M542 Cervicalgia: Secondary | ICD-10-CM

## 2011-05-12 DIAGNOSIS — I10 Essential (primary) hypertension: Secondary | ICD-10-CM

## 2011-05-12 MED ORDER — MELOXICAM 15 MG PO TABS: 15.0000 mg | ORAL_TABLET | Freq: Every day | ORAL | Status: DC

## 2011-05-12 MED ORDER — METFORMIN HCL 1000 MG PO TABS
1000.0000 mg | ORAL_TABLET | Freq: Two times a day (BID) | ORAL | Status: DC
Start: 2011-05-12 — End: 2011-05-12

## 2011-05-12 MED ORDER — METFORMIN HCL 1000 MG PO TABS: 1000.0000 mg | ORAL_TABLET | Freq: Two times a day (BID) | ORAL | Status: DC

## 2011-05-12 NOTE — Assessment & Plan Note (Signed)
Continue current treatment. 

## 2011-05-12 NOTE — Progress Notes (Signed)
  Subjective:    Patient ID: [name], female    DOB: Jan 17, 1956, 55 y.o.   MRN: [id number]  Diabetes   1. DM: Rx metformin 1000 mg in am and 1500 mg in pm, Hx of hypoglycemic episode (while on Lantus). Now on mealtime coverage, averaging 2-4 units AC. Patient checking BG QID. Usually 100s. High 201. Low 80. A1c increased today. Met with nutritionist already. Due for eye exam.   2. HTN: Rx: Norvasc, HCTZ, Accupril. Does not check at home.   3. HLD: Rx: Crestor. Denies myalgias.  4. Neck Pain: x a few weeks, intermittent, worse after working and with flexion/extension, better after rest. No radiation of pain, no CP, SOB, no N/V, no numbness/tingling, no injury or Hx of OP. Mom with OA.  She denies CP, SOB, N/V/D/C, HA, dizziness, numbness/tingling in hands/feet, LE edema, abdominal pain, dyspepsia, fever/chills, malaise.   PMHx: Benign Murmur      - Hochrein 6/03. 2D echo normal PMHx gastric ulcer TIA 2008- right arm/leg numbness.       - Negative MRI/MRA and head CT.  Neuro consulted, recommended starting Plavix. DM      - Two significant hypoglycemic episodes on Lantus (7 units) HTN HLD Anemia Postmenopausal  Review of Systems SEE HPI.    Objective:   Physical Exam General:  Well-developed, well-nourished, n no acute distress; alert, appropriate and cooperative throughout examination. Vitals reviewed. Lungs:  Normal respiratory effort, chest expands symmetrically. Lungs are clear to auscultation, no crackles or wheezes. Heart:  Reg Rate and Rhythm. No murmur. Pulses:  2+ DP. Extremities:  No edema.    Assessment & Plan:

## 2011-05-12 NOTE — Assessment & Plan Note (Signed)
Increase SSI today.

## 2011-05-12 NOTE — Patient Instructions (Signed)
It was nice to see you today!  [name] today is 9.2. Increase you sliding scale and call us if you consistently have blood sugars above 200.

## 2011-05-26 ENCOUNTER — Ambulatory Visit: Payer: Self-pay | Admitting: Family Medicine

## 2011-06-06 ENCOUNTER — Other Ambulatory Visit: Payer: Self-pay | Admitting: Family Medicine

## 2011-06-06 MED ORDER — CLOPIDOGREL BISULFATE 75 MG PO TABS: 75.0000 mg | ORAL_TABLET | Freq: Every day | ORAL | Status: DC

## 2011-06-23 ENCOUNTER — Encounter: Payer: Self-pay | Admitting: Family Medicine

## 2011-06-23 ENCOUNTER — Ambulatory Visit (INDEPENDENT_AMBULATORY_CARE_PROVIDER_SITE_OTHER): Payer: Self-pay | Admitting: Family Medicine

## 2011-06-23 VITALS — BP 123/80 | HR 94 | Ht 64.0 in | Wt 149.8 lb

## 2011-06-23 DIAGNOSIS — E119 Type 2 diabetes mellitus without complications: Secondary | ICD-10-CM

## 2011-06-23 DIAGNOSIS — R0989 Other specified symptoms and signs involving the circulatory and respiratory systems: Secondary | ICD-10-CM

## 2011-06-23 DIAGNOSIS — E669 Obesity, unspecified: Secondary | ICD-10-CM

## 2011-06-23 DIAGNOSIS — J3489 Other specified disorders of nose and nasal sinuses: Secondary | ICD-10-CM

## 2011-06-23 DIAGNOSIS — F17200 Nicotine dependence, unspecified, uncomplicated: Secondary | ICD-10-CM

## 2011-06-23 NOTE — Assessment & Plan Note (Signed)
Doing well today. Made some changes at the last visit with Dr. Earlene Plater.  Plan to continue what she is doing both with diet and medications. Will f/u in 2 months for a repeat A1c and recheck.

## 2011-06-23 NOTE — Progress Notes (Signed)
1) CHRONIC DIABETES  Disease Monitoring  Blood Sugar Ranges: 111-199  Polyuria: no   Visual problems: no   Medication Compliance: yes  Medication Side Effects  Hypoglycemia: no  Following a DM diet as outlined by Dr Gerilyn Pilgrim during diabetes eduction.   2) Weight Loss: Notes that she is losing weight without "dieting". She was as heavy as 200 lbs. She has been following  DM diet and now she is losing weight. She would like to weight 160 lbs. No fevers or chills, or night sweats or lymphadenopathy. Feels well otherwise. Her weight trend is below.   Wt Readings from Last 3 Encounters:  06/23/11 149 lb 12.8 oz (67.949 kg)  05/12/11 148 lb 6.4 oz (67.314 kg)  04/19/11 145 lb (65.772 kg)    3) Tobacco Use: Uses Dip around 1 can per week.  Dips just after breakfast and all day long. Would like to quit and has failed in the past. Has not tried any medications or nicotine supplementation.   4) Runny nose: For around 1 year. No sneezing or itchy eyes or other allergy symptoms. Feels well otherwise. Has not tired any nasal sprays.   PMH reviewed.  ROS as above otherwise neg  Exam:  BP 123/80  Pulse 94  Ht 5\' 4"  (1.626 m)  Wt 149 lb 12.8 oz (67.949 kg)  BMI 25.71 kg/m2 Gen: Well NAD HEENT: EOMI,  MMM. Poor detention. Oral tobacco still in the lower lip. No oral lesions noted. No cervical or supraclavicular LAD. Nose without discharge. Nasal turbinates pink and moist.  Lungs: CTABL Nl WOB Heart: RRR no MRG Abd: NABS, NT, ND Exts: Non edematous BL  LE, warm and well perfused.

## 2011-06-23 NOTE — Assessment & Plan Note (Signed)
Clear intermittent rhinorrhea. Likely due to oral tobacco use.  Will follow once quit and if still present will further workup.

## 2011-06-23 NOTE — Patient Instructions (Signed)
Thank you for coming in today. Come back for just a tobacco visit as soon as you like.  We will set up a quit date and some prescriptions.  Your weight loss is likely due to your good diabetes diet.  Keep doing what you are doing for diabetes.  Come back at the beginning of October.

## 2011-06-23 NOTE — Assessment & Plan Note (Signed)
Has continued to lose weight with adherence to a DM diet. She was not aware that she was actually losing weight. She feels well. No red flags for malignancy. Will follow.

## 2011-06-23 NOTE — Assessment & Plan Note (Signed)
Would like to quit. This was brought up at the end of the visit.  Will plan for a dedicated visit to talk about tobacco cessation in the next few weeks.

## 2011-06-27 ENCOUNTER — Other Ambulatory Visit: Payer: Self-pay | Admitting: Family Medicine

## 2011-06-27 NOTE — Telephone Encounter (Signed)
Refill request

## 2011-07-08 ENCOUNTER — Encounter: Payer: Self-pay | Admitting: Pharmacist

## 2011-07-08 ENCOUNTER — Ambulatory Visit (INDEPENDENT_AMBULATORY_CARE_PROVIDER_SITE_OTHER): Payer: Self-pay | Admitting: Pharmacist

## 2011-07-08 VITALS — BP 117/73 | HR 93 | Ht 65.0 in | Wt 148.0 lb

## 2011-07-08 DIAGNOSIS — F17200 Nicotine dependence, unspecified, uncomplicated: Secondary | ICD-10-CM

## 2011-07-08 NOTE — Assessment & Plan Note (Signed)
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  Barriers to success include long habit, stress/anxiety, and an older sister who still uses.  We discussed options to replace potential tiggers including chewing gum, taking walks, cleaning the house, and watching her grandchildren.  Plan to quit on labor day and initiated nicotine replacement tx 21mg  patches daily. Patient counseled on purpose, proper use, and potential adverse effects, including skin irritation/reactions, mild withdrawal symptoms, and sleep disturbances.  Since financial constraints exist, we suggested shopping around for the best deals/prices.  Written information provided.  F/U phone call Tues after labor day (home# [phone]).  F/U Rx Clinic Visit in mid Sept.  Total time with patient in face-to-face counseling 35 minutes.  Patient seen with Audry Riles, PharmD Candidate and Albertine Grates, Pharmacy Resident and Grandville Silos MS4.

## 2011-07-08 NOTE — Progress Notes (Signed)
  Subjective:    Patient ID: [name], female    DOB: 1956-03-03, 55 y.o.   MRN: [id number]  HPI [name] presents to clinic today in good spirits eager and ready to quit using chew tobacco.  She reports using Superior brand tobacco throughout the day and night.  One box lasts 8-9 days.  Her reasons for quitting include dental problems, kids wanting her to quit, wasted money, and reading about cancers.  She has previously tried to quit, but only made it for one meal before becoming anxious and agitated.  Importance 10/10, Readiness 9/10, Confidence 10/10.   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  Barriers to success include long habit, stress/anxiety, and an older sister who still uses.  We discussed options to replace potential tiggers including chewing gum, taking walks, cleaning the house, and watching her grandchildren.  Plan to quit on labor day and initiated nicotine replacement tx 21mg  patches daily. Patient counseled on purpose, proper use, and potential adverse effects, including skin irritation/reactions, mild withdrawal symptoms, and sleep disturbances.  Since financial constraints exist, we suggested shopping around for the best deals/prices.  Written information provided.  F/U phone call Tues after labor day (home# [phone]).  F/U Rx Clinic Visit in mid Sept.  Total time with patient in face-to-face counseling 35 minutes.  Patient seen with Audry Riles, PharmD Candidate and Albertine Grates, Pharmacy Resident and Grandville Silos MS4.

## 2011-07-08 NOTE — Patient Instructions (Signed)
Congratulations on being ready and confident to quit using tobacco!  Plan: 1. My quit date: Labor Day, September 3rd (Monday) 2. Look around at the prices for nicotine patches; store brands are OK like Equate brand at [facility] 3. We recommend using one 21 mg patch daily. 4. Start to cut down on how much you use, and replace chewing tobacco with sugar-free gum throughout the day and after meals.  Return to see Dr. Raymondo Band for a follow-up during mid September.

## 2011-07-08 NOTE — Progress Notes (Signed)
  Subjective:    Patient ID: [name], female    DOB: July 15, 1956, 55 y.o.   MRN: [id number]  HPI  Reviewed and agree with Dr. Macky Lower management     Review of Systems     Objective:   Physical Exam        Assessment & Plan:

## 2011-08-02 ENCOUNTER — Ambulatory Visit: Payer: Self-pay | Admitting: Pharmacist

## 2011-08-16 ENCOUNTER — Ambulatory Visit: Payer: Self-pay | Admitting: Pharmacist

## 2011-08-18 ENCOUNTER — Other Ambulatory Visit: Payer: Self-pay | Admitting: Family Medicine

## 2011-08-18 MED ORDER — HYDROCHLOROTHIAZIDE 25 MG PO TABS: 25.0000 mg | ORAL_TABLET | Freq: Every day | ORAL | Status: DC

## 2011-08-23 ENCOUNTER — Other Ambulatory Visit: Payer: Self-pay | Admitting: Family Medicine

## 2011-08-23 MED ORDER — HYDROCHLOROTHIAZIDE 25 MG PO TABS: 12.5000 mg | ORAL_TABLET | Freq: Every day | ORAL | Status: DC

## 2011-08-29 ENCOUNTER — Encounter: Payer: Self-pay | Admitting: Family Medicine

## 2011-08-29 ENCOUNTER — Ambulatory Visit (INDEPENDENT_AMBULATORY_CARE_PROVIDER_SITE_OTHER): Payer: Self-pay | Admitting: Family Medicine

## 2011-08-29 ENCOUNTER — Ambulatory Visit (INDEPENDENT_AMBULATORY_CARE_PROVIDER_SITE_OTHER): Payer: Self-pay | Admitting: Pharmacist

## 2011-08-29 ENCOUNTER — Encounter: Payer: Self-pay | Admitting: Pharmacist

## 2011-08-29 VITALS — BP 126/82 | HR 92 | Wt 150.6 lb

## 2011-08-29 DIAGNOSIS — Z23 Encounter for immunization: Secondary | ICD-10-CM

## 2011-08-29 DIAGNOSIS — M2020 Hallux rigidus, unspecified foot: Secondary | ICD-10-CM

## 2011-08-29 DIAGNOSIS — M2021 Hallux rigidus, right foot: Secondary | ICD-10-CM | POA: Insufficient documentation

## 2011-08-29 DIAGNOSIS — F17200 Nicotine dependence, unspecified, uncomplicated: Secondary | ICD-10-CM

## 2011-08-29 DIAGNOSIS — E119 Type 2 diabetes mellitus without complications: Secondary | ICD-10-CM

## 2011-08-29 HISTORY — DX: Hallux rigidus, right foot: M20.21

## 2011-08-29 LAB — POCT GLYCOSYLATED HEMOGLOBIN (HGB A1C): Hemoglobin A1C: 9.1

## 2011-08-29 NOTE — Patient Instructions (Signed)
Continue cutting tobacco use. Try to only use for 30 minutes at a time. Quit at the end of current supply or earlier. Next visit with Dr. Denyse Amass

## 2011-08-29 NOTE — Progress Notes (Signed)
[name] presents to clinic today to followup her diabetes and to discuss her right big toe pain.  1) Diabetes: Is taking metformin and NovoLog sliding scale insulin. After review of diabetes log most of her fasting glucoses are between 95 and 150 she has no lows or hives. She is essentially asymptomatic. Her A1c was checked today and found to be 9.1.  She doesn't check her trip glucoses after her meals. 24-hour food recall reveal corn flakes for breakfast with orange juice regular soda throughout the day and very few vegetables.  She has been to diabetes nutrition before. Additionally she notes that she'll eat hard candy throughout the day do to "a sweet tooth ".    2) Right great toe pain. 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 Pain is intermittent doesn't last for a long in her toes not exquisitely tender to touch. No fevers or chills.  She notes that she's had a spot on her left toe that's been there for many years and has not changed.  PMH reviewed.  ROS as above otherwise neg Medications reviewed.  Exam:  BP 126/82  Pulse 92  Wt 150 lb 9.6 oz (68.312 kg) Gen: Well NAD HEENT: EOMI,  MMM Lungs: CTABL Nl WOB Heart: RRR no MRG Abd: NABS, NT, ND Exts: Non edematous BL  LE, warm and well perfused.  Foot: Right foot normal-appearing normal pulses 4/4 monofilament. Right great toe decreased range of motion to plantar flexion. Nontender nonswollen no erythema. Left foot normal-appearing normal pulses 4/4 to monofilament. At the tip of the great toe there is a 2 mm hyperpigmented macule. Nails in good shape bilaterally.  Lab Results  Component Value Date   HGBA1C 9.1 08/29/2011

## 2011-08-29 NOTE — Assessment & Plan Note (Signed)
Diabetes not well-. Review of fasting glucose and diet reveal adequately controlled fasting glucoses are high A1c. I think this is due to excessive carbohydrate intake.  Discussed how foods or with high glycemic index will cause rapid increase of postprandial glucose. Advised against cornflakes another highly processed carbohydrates and advised against sugar sweetened beverages such as regular soda. We will follow this patient up in one month with review of diet and glucose log. May need increase NovoLog  next month

## 2011-08-29 NOTE — Progress Notes (Signed)
  Subjective:    Patient ID: [name], female    DOB: 06/13/1956, 55 y.o.   MRN: [id number]  HPI  Reviewed and agree with Dr. Koval's management     Review of Systems     Objective:   Physical Exam        Assessment & Plan:   

## 2011-08-29 NOTE — Progress Notes (Signed)
  Subjective:    Patient ID: [name], female    DOB: September 24, 1956, 55 y.o.   MRN: [id number]  HPI Pt comes in good spirit. She cut down tobacco use from 6 times a day to about 2-3 times a day. She uses puzzle books and sugar-free gum to keep herself distracted. She still uses after breakfast, lunch, and dinner. Pt seems very eager to quit, but not willing to quit tomorrow just yet. Pt is confident (10/10) to quit by the end of the year.   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    Pt. Was unable to afford nicotine replacement, but has cut down on her usage by herself. Pt will try to stop using tobacco around lunch time and completely quit by the end of this year. Written information provided.  F/U Rx Clinic Visit   Total time with patient in face-to-face counseling 20 minutes.  Patient seen with Adrian Blackwater, PharmD Candidate and Haynes Hoehn, Pharmacy Resident.

## 2011-08-29 NOTE — Assessment & Plan Note (Signed)
I don't think this is gout. 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 Advised proper footwear. Recommended Hapad sports insoles. Gave patient information her purchase these. At followup visit will place insoles

## 2011-08-29 NOTE — Patient Instructions (Addendum)
Thank you for coming in today. For your foot pain: Get some Hapad Sports Insols: http://www.hapad.com/hapadpro/comfsport.[email or web address] You can bring them back and I can fit them to your shoe.  For diabetes will will check [name] today and adjust as needed.  After review of your diet I see that you are eating too many carbs.   1) Please eliminate Sugar Drinks like Regular sods, Juice, and Sweet Tea. These will shoot up your glucose levels.  2) If you need to have some cady use the diabatic kind. Taste changes and is learned. If you put some hard work in now you will lose your sweet tooth.  3) Try to eat a vegetable with Lunch and Dinner and a fruit with snacks and breakfast. Bananas are the candy bar of the fruit world. Try apples, strawberries, berries, Whole fruit like an orange.   Come back in 1 moth with your diabetes log and those insoles and I will fiddle with both.

## 2011-08-29 NOTE — Assessment & Plan Note (Signed)
Mild Nicotine Dependence of 10 years duration in a patient who is excellent candidate for success b/c of her enthusiasm, motivation to quit and progress she has made on her own thus far.    Pt. Was unable to afford nicotine replacement, but has cut down on her usage by herself. Pt will try to stop using tobacco around lunch time and completely quit by the end of this year. Written information provided.  F/U Rx Clinic Visit   Total time with patient in face-to-face counseling 20 minutes.  Patient seen with Adrian Blackwater, PharmD Candidate and Haynes Hoehn, Pharmacy Resident.

## 2011-09-30 ENCOUNTER — Ambulatory Visit: Payer: Self-pay | Admitting: Family Medicine

## 2011-10-10 ENCOUNTER — Encounter: Payer: Self-pay | Admitting: Family Medicine

## 2011-10-10 ENCOUNTER — Ambulatory Visit (INDEPENDENT_AMBULATORY_CARE_PROVIDER_SITE_OTHER): Payer: Self-pay | Admitting: Family Medicine

## 2011-10-10 DIAGNOSIS — E1165 Type 2 diabetes mellitus with hyperglycemia: Secondary | ICD-10-CM

## 2011-10-10 DIAGNOSIS — G4700 Insomnia, unspecified: Secondary | ICD-10-CM | POA: Insufficient documentation

## 2011-10-10 NOTE — Assessment & Plan Note (Signed)
Plan to work on sleep hygiene and will followup in 2 months.

## 2011-10-10 NOTE — Patient Instructions (Signed)
Thank you for coming in today. Please review the handout below.  Bed is only for sleep and for sex.  No TV, Reading, Eating in bed.    Insomnia Insomnia is frequent trouble falling and/or staying asleep. Insomnia can be a long term problem or a short term problem. Both are common. Insomnia can be a short term problem when the wakefulness is related to a certain stress or worry. Long term insomnia is often related to ongoing stress during waking hours and/or poor sleeping habits. Overtime, sleep deprivation itself can make the problem worse. Every little thing feels more severe because you are overtired and your ability to cope is decreased. CAUSES    Stress, anxiety, and depression.     Poor sleeping habits.     Distractions such as TV in the bedroom.     Naps close to bedtime.     Engaging in emotionally charged conversations before bed.     Technical reading before sleep.     Alcohol and other sedatives. They may make the problem worse. They can hurt normal sleep patterns and normal dream activity.     Stimulants such as caffeine for several hours prior to bedtime.     Pain syndromes and shortness of breath can cause insomnia.     Exercise late at night.     Changing time zones may cause sleeping problems (jet lag).  It is sometimes helpful to have someone observe your sleeping patterns. They should look for periods of not breathing during the night (sleep apnea). They should also look to see how long those periods last. If you live alone or observers are uncertain, you can also be observed at a sleep clinic where your sleep patterns will be professionally monitored. Sleep apnea requires a checkup and treatment. Give your caregivers your medical history. Give your caregivers observations your family has made about your sleep.   SYMPTOMS    Not feeling rested in the morning.     Anxiety and restlessness at bedtime.     Difficulty falling and staying asleep.  TREATMENT     Your caregiver may prescribe treatment for an underlying medical disorders. Your caregiver can give advice or help if you are using alcohol or other drugs for self-medication. Treatment of underlying problems will usually eliminate insomnia problems.     Medications can be prescribed for short time use. They are generally not recommended for lengthy use.     Over-the-counter sleep medicines are not recommended for lengthy use. They can be habit forming.     You can promote easier sleeping by making lifestyle changes such as:     Using relaxation techniques that help with breathing and reduce muscle tension.     Exercising earlier in the day.     Changing your diet and the time of your last meal. No night time snacks.     Establish a regular time to go to bed.     Counseling can help with stressful problems and worry.     Soothing music and white noise may be helpful if there are background noises you cannot remove.     Stop tedious detailed work at least one hour before bedtime.  HOME CARE INSTRUCTIONS    Keep a diary. Inform your caregiver about your progress. This includes any medication side effects. See your caregiver regularly. Take note of:     Times when you are asleep.     Times when you are awake during the night.  The quality of your sleep.     How you feel the next day.  This information will help your caregiver care for you.  Get out of bed if you are still awake after 15 minutes. Read or do some quiet activity. Keep the lights down. Wait until you feel sleepy and go back to bed.     Keep regular sleeping and waking hours. Avoid naps.     Exercise regularly.     Avoid distractions at bedtime. Distractions include watching television or engaging in any intense or detailed activity like attempting to balance the household checkbook.     Develop a bedtime ritual. Keep a familiar routine of bathing, brushing your teeth, climbing into bed at the same time each  night, listening to soothing music. Routines increase the success of falling to sleep faster.     Use relaxation techniques. This can be using breathing and muscle tension release routines. It can also include visualizing peaceful scenes. You can also help control troubling or intruding thoughts by keeping your mind occupied with boring or repetitive thoughts like the old concept of counting sheep. You can make it more creative like imagining planting one beautiful flower after another in your backyard garden.     During your day, work to eliminate stress. When this is not possible use some of the previous suggestions to help reduce the anxiety that accompanies stressful situations.  MAKE SURE YOU:    Understand these instructions.     Will watch your condition.     Will get help right away if you are not doing well or get worse.  Document Released: 10/28/2000 Document Revised: 07/13/2011 Document Reviewed: 11/28/2007 [facility] Patient Information 2012 [place], [place].

## 2011-10-10 NOTE — Progress Notes (Signed)
Here to discuss  Insomnia and diabetes.  #1 :  Insomnia: Present for over one year. She notes that she will go to bed at 8 PM watch TV until 10:30.   She will then try to sleep would be unable to until the morning and she will finally fall sleep.   She denies any caffeine intake past breakfast and is not taking naps during the day.   She notes that one of the things that prevents her from sleeping is that it's pretty noisy outside of her bedroom.   #2: DM:  In the interim she feels like her diabetes is doing well. She now is eating sugar-free candy and doesn't drink any soda. She notes that her fasting glucoses have been usually around 130 and her before dinner  Glucoses are 150s.   She denies any polyuria or polydipsia.  PMH reviewed.  ROS as above otherwise neg Medications reviewed. Current Outpatient Prescriptions  Medication Sig Dispense Refill  . amLODipine (NORVASC) 10 MG tablet Take 10 mg by mouth daily.        . clopidogrel (PLAVIX) 75 MG tablet Take 1 tablet (75 mg total) by mouth daily.  90 tablet  2  . glucose blood test strip Freestyle Freedom Strips. Check sugars 4 times a day.       . hydrochlorothiazide (HYDRODIURIL) 25 MG tablet Take 0.5 tablets (12.5 mg total) by mouth daily.  15 tablet  6  . insulin aspart (NOVOLOG) 100 UNIT/ML injection Inject 2-6 units per sliding scale after breakfast, lunch, and dinner.      . Insulin Syringe-Needle U-100 (B-D INS SYR ULTRAFINE .5CC/30G) 30G X 1/2" 0.5 ML MISC daily use of insulin 1 box - supply for one month       . Lancets Fine 28G MISC Check sugars twice a day       . metFORMIN (GLUCOPHAGE) 1000 MG tablet        . quinapril (ACCUPRIL) 40 MG tablet Take 40 mg by mouth daily.       . rosuvastatin (CRESTOR) 10 MG tablet Take 10 mg by mouth daily.          Exam:  BP 139/85  Pulse 94  Ht 5\' 4"  (1.626 m)  Wt 148 lb (67.132 kg)  BMI 25.40 kg/m2 Gen: Well NAD HEENT: EOMI,  MMM Lungs: CTABL Nl WOB Heart: RRR no MRG Abd: NABS, NT,  ND Exts: Non edematous BL  LE, warm and well perfused.

## 2011-10-10 NOTE — Assessment & Plan Note (Signed)
Doing better based on review of glucoses and not eating as much carbohydrates.  plan to followup in 2 months

## 2011-10-11 ENCOUNTER — Other Ambulatory Visit: Payer: Self-pay | Admitting: Family Medicine

## 2011-10-11 NOTE — Telephone Encounter (Signed)
Refill request

## 2011-12-20 ENCOUNTER — Other Ambulatory Visit: Payer: Self-pay | Admitting: Family Medicine

## 2011-12-20 DIAGNOSIS — Z1231 Encounter for screening mammogram for malignant neoplasm of breast: Secondary | ICD-10-CM

## 2011-12-27 ENCOUNTER — Ambulatory Visit (HOSPITAL_COMMUNITY)
Admission: RE | Admit: 2011-12-27 | Discharge: 2011-12-27 | Disposition: A | Discharge status: Home / Self Care | Payer: Self-pay | Source: Ambulatory Visit | Attending: Family Medicine | Admitting: Family Medicine

## 2011-12-27 DIAGNOSIS — Z1231 Encounter for screening mammogram for malignant neoplasm of breast: Secondary | ICD-10-CM | POA: Insufficient documentation

## 2012-01-02 ENCOUNTER — Other Ambulatory Visit: Payer: Self-pay | Admitting: Family Medicine

## 2012-01-02 NOTE — Telephone Encounter (Signed)
Refill request

## 2012-01-04 NOTE — Telephone Encounter (Signed)
Patient has appointment schedule with Dr. Denyse Amass 01/16/11 @ 4pm  Ileana Ladd

## 2012-01-12 ENCOUNTER — Ambulatory Visit: Payer: Self-pay | Admitting: Family Medicine

## 2012-01-16 ENCOUNTER — Ambulatory Visit (INDEPENDENT_AMBULATORY_CARE_PROVIDER_SITE_OTHER): Payer: Self-pay | Admitting: Family Medicine

## 2012-01-16 ENCOUNTER — Encounter: Payer: Self-pay | Admitting: Family Medicine

## 2012-01-16 VITALS — BP 133/81 | HR 100 | Temp 99.1°F | Ht 64.0 in | Wt 148.4 lb

## 2012-01-16 DIAGNOSIS — G4700 Insomnia, unspecified: Secondary | ICD-10-CM

## 2012-01-16 DIAGNOSIS — K089 Disorder of teeth and supporting structures, unspecified: Secondary | ICD-10-CM

## 2012-01-16 DIAGNOSIS — E1165 Type 2 diabetes mellitus with hyperglycemia: Secondary | ICD-10-CM

## 2012-01-16 DIAGNOSIS — IMO0002 Reserved for concepts with insufficient information to code with codable children: Secondary | ICD-10-CM

## 2012-01-16 DIAGNOSIS — F17200 Nicotine dependence, unspecified, uncomplicated: Secondary | ICD-10-CM

## 2012-01-16 MED ORDER — TRAZODONE HCL 50 MG PO TABS: 25.0000 mg | ORAL_TABLET | Freq: Every evening | ORAL | Status: DC | PRN

## 2012-01-16 MED ORDER — GLIPIZIDE 5 MG PO TABS: 5.0000 mg | ORAL_TABLET | Freq: Two times a day (BID) | ORAL | Status: DC

## 2012-01-16 NOTE — Progress Notes (Signed)
Patient ID: [name], female   DOB: 12/23/1955, 56 y.o.   MRN: [id number] [name] is a 56 y.o. female who presents to [facility] today for   1) diabetes: Taking metformin twice a day and NovoLog 4-6 units with meals. Brings her glucose log in which shows glucoses around 1:30 to 140 with max of 205 and minimum of 105. She feels well without any hypoglycemic episodes. No polyuria or polydipsia.  2) insomnia: Notes been able to fall asleep at around 10:00 but will wake up n.p.o. for most of the night. She continues to do work well with sleep hygiene including no caffeine alcohol or stimulating things before bedtime. She does note that she uses oral tobacco up until she goes to bed.  She's not sure what type of sleeping pill she has tried in the past but does note she had what she describes hallucinations on one of them. She would like to be able to sleep better.  3) teeth: Has poor dentition and has the orange card. She needs a referral for dental work.  PMH reviewed. Significant for diabetes and oral tobacco use ROS as above otherwise neg Medications reviewed. Current Outpatient Prescriptions  Medication Sig Dispense Refill  . amLODipine (NORVASC) 10 MG tablet Take 10 mg by mouth daily.        . clopidogrel (PLAVIX) 75 MG tablet Take 1 tablet (75 mg total) by mouth daily.  90 tablet  2  . glucose blood test strip Freestyle Freedom Strips. Check sugars 4 times a day.       . hydrochlorothiazide (HYDRODIURIL) 25 MG tablet Take 0.5 tablets (12.5 mg total) by mouth daily.  15 tablet  6  . insulin aspart (NOVOLOG) 100 UNIT/ML injection Inject 2-6 units per sliding scale after breakfast, lunch, and dinner.      . Insulin Syringe-Needle U-100 (B-D INS SYR ULTRAFINE .5CC/30G) 30G X 1/2" 0.5 ML MISC daily use of insulin 1 box - supply for one month       . Lancets Fine 28G MISC Check sugars twice a day       . metFORMIN (GLUCOPHAGE) 1000 MG tablet        . metFORMIN (GLUCOPHAGE) 1000 MG tablet TAKE  1 TABLET BY MOUTH TWICE DAILY (MORNING AND NIGHT)  60 tablet  0  . quinapril (ACCUPRIL) 40 MG tablet Take 40 mg by mouth daily.       . rosuvastatin (CRESTOR) 10 MG tablet Take 10 mg by mouth daily.        Marland Kitchen glipiZIDE (GLUCOTROL) 5 MG tablet Take 1 tablet (5 mg total) by mouth 2 (two) times daily before a meal.  60 tablet  1  . traZODone (DESYREL) 50 MG tablet Take 0.5-1 tablets (25-50 mg total) by mouth at bedtime as needed for sleep.  30 tablet  3  . DISCONTD: metFORMIN (GLUCOPHAGE) 1000 MG tablet TAKE 1 TABLET BY MOUTH TWICE DAILY (MORNING AND NIGHT)  60 tablet  2    Exam:  BP 133/81  Pulse 100  Temp(Src) 99.1 F (37.3 C) (Oral)  Ht 5\' 4"  (1.626 m)  Wt 148 lb 7 oz (67.331 kg)  BMI 25.48 kg/m2 Gen: Well NAD HEENT: EOMI,  MMM, poor detention Lungs: CTABL Nl WOB Heart: RRR no MRG Abd: NABS, NT, ND Exts: Non edematous BL  LE, warm and well perfused.

## 2012-01-16 NOTE — Patient Instructions (Signed)
Thank you for coming in today. Please take glipizide twice a day for diabetes.  Continue your metformin twice a day.   Both glipizide and metformin are Free at [facility].  Call around to figure out where plavix is the cheapest.  Use trazodone one half to one pill at night as needed for sleep.  See me in 1 month.

## 2012-01-16 NOTE — Assessment & Plan Note (Signed)
Very eager to quit and has a quit date established

## 2012-01-16 NOTE — Assessment & Plan Note (Signed)
On metformin and NovoLog. Her A1c improved to 8.2.  Plan add glipizide and followup in one month.  Discussed warning signs for hypoglycemia

## 2012-01-16 NOTE — Assessment & Plan Note (Signed)
Not sure of etiology. Nor am I sure of what sleeping pill caused hallucinations. This is most likely Ambien or some other sedative hypnotic. Plan to try low dose trazodone and encourage abstinence from all tobacco a few hours before bedtime.  Followup in one month

## 2012-01-30 ENCOUNTER — Other Ambulatory Visit: Payer: Self-pay | Admitting: Family Medicine

## 2012-01-30 NOTE — Telephone Encounter (Signed)
Refill request

## 2012-02-13 ENCOUNTER — Other Ambulatory Visit: Payer: Self-pay | Admitting: Family Medicine

## 2012-02-13 DIAGNOSIS — E119 Type 2 diabetes mellitus without complications: Secondary | ICD-10-CM

## 2012-02-13 DIAGNOSIS — I10 Essential (primary) hypertension: Secondary | ICD-10-CM

## 2012-02-13 MED ORDER — HYDROCHLOROTHIAZIDE 25 MG PO TABS: 12.5000 mg | ORAL_TABLET | Freq: Every day | ORAL | Status: DC

## 2012-02-13 NOTE — Telephone Encounter (Signed)
Patient is calling about getting a refill on her Metformin and her HCTZ.  She is now completely out.  She uses [facility], [address]

## 2012-02-17 ENCOUNTER — Ambulatory Visit: Payer: Self-pay | Admitting: Family Medicine

## 2012-03-14 ENCOUNTER — Encounter: Payer: Self-pay | Admitting: Family Medicine

## 2012-03-14 ENCOUNTER — Ambulatory Visit (INDEPENDENT_AMBULATORY_CARE_PROVIDER_SITE_OTHER): Payer: Self-pay | Admitting: Family Medicine

## 2012-03-14 VITALS — BP 135/82 | HR 96 | Ht 64.0 in | Wt 155.0 lb

## 2012-03-14 DIAGNOSIS — E119 Type 2 diabetes mellitus without complications: Secondary | ICD-10-CM

## 2012-03-14 DIAGNOSIS — Z8673 Personal history of transient ischemic attack (TIA), and cerebral infarction without residual deficits: Secondary | ICD-10-CM

## 2012-03-14 DIAGNOSIS — M542 Cervicalgia: Secondary | ICD-10-CM | POA: Insufficient documentation

## 2012-03-14 MED ORDER — CYCLOBENZAPRINE HCL 10 MG PO TABS: 10.0000 mg | ORAL_TABLET | Freq: Every evening | ORAL | Status: AC | PRN

## 2012-03-14 NOTE — Patient Instructions (Signed)
Thank you for coming in today. 1) Plavix: Stop plavix and start aspirin 81 mg daily.  2) Sleep continue trazodone as needed.  3) Neck pain: Start flexeril at night as needed. Continue tylenol and naproxen.  Come back or go to the emergency room if you notice new weakness new numbness problems walking or bowel or bladder problems. 4) Diabetes: Keep taking your medicines. You will be due for another diabetes check in July.  I am leaving at the end of June.  You will have a new doctor.  Come back sooner if you do not get better.

## 2012-03-15 ENCOUNTER — Encounter: Payer: Self-pay | Admitting: Family Medicine

## 2012-03-15 NOTE — Progress Notes (Signed)
[name] is a 56 y.o. female who presents to [facility] today for   1) diabetes: On glipizide  NovoLog and metformin.  Doing well with blood sugars ranging from 87-204 with no sugars at 130. No polyuria polydipsia or hypoglycemic episodes. Feels well..  2) neck pain: Patient notes right lateral neck pain without radiation weakness or numbness. She has no difficulty walking or bowel or bladder dysfunction.  She notes that she often has muscular neck pain and this pain has recurred  over the last few days.  She's taking Tylenol and ibuprofen. She thinks perhaps that is a medicine in the past works.  3) history of stroke: Patient is on Plavix and she has trouble affording it.  She's not quite sure why she needs to be on Plavix. She denies any coronary artery disease or stents.   PMH, SH reviewed: Significant for diabetes and hypertension with a history of stroke ROS as above  Medications reviewed. Current Outpatient Prescriptions  Medication Sig Dispense Refill  . amLODipine (NORVASC) 10 MG tablet Take 10 mg by mouth daily.        Marland Kitchen glipiZIDE (GLUCOTROL) 5 MG tablet Take 1 tablet (5 mg total) by mouth 2 (two) times daily before a meal.  60 tablet  1  . glucose blood test strip Freestyle Freedom Strips. Check sugars 4 times a day.       . hydrochlorothiazide (HYDRODIURIL) 25 MG tablet Take 0.5 tablets (12.5 mg total) by mouth daily.  15 tablet  6  . insulin aspart (NOVOLOG) 100 UNIT/ML injection Inject 2-6 units per sliding scale after breakfast, lunch, and dinner.      . Insulin Syringe-Needle U-100 (B-D INS SYR ULTRAFINE .5CC/30G) 30G X 1/2" 0.5 ML MISC daily use of insulin 1 box - supply for one month       . Lancets Fine 28G MISC Check sugars twice a day       . metFORMIN (GLUCOPHAGE) 1000 MG tablet        . quinapril (ACCUPRIL) 40 MG tablet Take 40 mg by mouth daily.       . rosuvastatin (CRESTOR) 10 MG tablet Take 10 mg by mouth daily.        . clopidogrel (PLAVIX) 75 MG tablet Take 1  tablet (75 mg total) by mouth daily.  90 tablet  2  . cyclobenzaprine (FLEXERIL) 10 MG tablet Take 1 tablet (10 mg total) by mouth at bedtime as needed for muscle spasms.  30 tablet  2  . traZODone (DESYREL) 50 MG tablet Take 0.5-1 tablets (25-50 mg total) by mouth at bedtime as needed for sleep.  30 tablet  3    Exam:  BP 135/82  Pulse 96  Ht 5\' 4"  (1.626 m)  Wt 155 lb (70.308 kg)  BMI 26.61 kg/m2 Gen: Well NAD HEENT: EOMI,  MMM Lungs: CTABL Nl WOB Heart: RRR no MRG Abd: NABS, NT, ND Exts: Non edematous BL  LE, warm and well perfused.  Neck: Nontender over spinal midline. Tender on the right paraspinal muscle groups of the cervical spine.  Normal range of motion. Upper extremity strength coordination and sensation is intact reflexes are equal throughout all 4 extremities. Normal gait  No results found for this or any previous visit (from the past 72 hour(s)).

## 2012-03-15 NOTE — Assessment & Plan Note (Signed)
Patient is on Plavix but cannot afford it. I don't see any reason in her medical history to be on Plavix aside from a history of TIA. As the benefit over aspirin in this situation is not certain and patient has difficulty affording his medication I feel that switching to 81 mg of aspirin a day is reasonable.

## 2012-03-15 NOTE — Assessment & Plan Note (Signed)
Patient is here to followup after starting glipizide. She is currently doing well with blood sugars in a reasonable range and has no hypoglycemic episodes. Plan to continue current regimen and check hemoglobin A1c in approximately 2 months

## 2012-03-15 NOTE — Assessment & Plan Note (Signed)
Neck pain consistent with muscular type. Plan to treat with Flexeril and Tylenol and range of motion exercises. Patient expresses understanding and will followup as needed

## 2012-03-20 ENCOUNTER — Other Ambulatory Visit: Payer: Self-pay | Admitting: Family Medicine

## 2012-05-08 ENCOUNTER — Ambulatory Visit (INDEPENDENT_AMBULATORY_CARE_PROVIDER_SITE_OTHER): Payer: Self-pay | Admitting: Family Medicine

## 2012-05-08 ENCOUNTER — Encounter: Payer: Self-pay | Admitting: Family Medicine

## 2012-05-08 VITALS — BP 100/64 | HR 96 | Temp 98.3°F | Ht 64.0 in | Wt 156.0 lb

## 2012-05-08 DIAGNOSIS — R21 Rash and other nonspecific skin eruption: Secondary | ICD-10-CM | POA: Insufficient documentation

## 2012-05-08 DIAGNOSIS — E119 Type 2 diabetes mellitus without complications: Secondary | ICD-10-CM

## 2012-05-08 LAB — POCT GLYCOSYLATED HEMOGLOBIN (HGB A1C): Hemoglobin A1C: 8.8

## 2012-05-08 MED ORDER — TRIAMCINOLONE ACETONIDE 0.1 % EX CREA: TOPICAL_CREAM | Freq: Two times a day (BID) | CUTANEOUS | Status: DC

## 2012-05-08 MED ORDER — HYDROXYZINE HCL 10 MG PO TABS: 10.0000 mg | ORAL_TABLET | Freq: Four times a day (QID) | ORAL | Status: AC | PRN

## 2012-05-08 NOTE — Progress Notes (Signed)
  Subjective:    Patient ID: [name], female    DOB: 18-Aug-1956, 56 y.o.   MRN: [id number]  HPI #1. Rash: Present for the past 2 days after she had a cookout outdoors. She states that they were in her friend's yard that the shrubs and trees around. After a cookout she noticed itching on the dorsal aspect of her left hand. It has since been spreading. She has not tried anything for relief except for some lotion. No fevers or chills. No recent illnesses.   Review of Systems See HPI above for review of systems.       Objective:   Physical Exam  Gen:  Alert, cooperative patient who appears stated age in no acute distress.  Vital signs reviewed. Skin:  Several papules and vesicles in linear distribution across dorsum of left hand. She also has several lesions on medial/inner aspect of her right upper extremity. No other lesions or rash noted on full body check.      Assessment & Plan:

## 2012-05-08 NOTE — Assessment & Plan Note (Signed)
I did not see patient's A1C until after she had left clinic.   We did not discuss her diabetic regimen at all as this was a work-in appt for her rash.   FU with PCP for this.

## 2012-05-08 NOTE — Patient Instructions (Signed)
Use the cream twice daily wherever you have the rash. The itch medicine is called hydroxyzine or Atarax.  Use this to stop the itches.   It can make you sleepy.

## 2012-05-08 NOTE — Assessment & Plan Note (Signed)
Appears to be poison ivy. Bug bites another possibility - treated the same.  She is diabetic did not provide her with oral prednisone. Did provide her with topical steroid. Atarax for itch relief

## 2012-05-18 ENCOUNTER — Other Ambulatory Visit: Payer: Self-pay | Admitting: Family Medicine

## 2012-05-18 NOTE — Telephone Encounter (Signed)
Faxed refill for accupril 40mg  daily 90 tabs, 0 refills. Patient to make appointment for hypertension after prescription runs out.

## 2012-06-21 ENCOUNTER — Ambulatory Visit: Payer: Self-pay | Admitting: Family Medicine

## 2012-06-27 ENCOUNTER — Other Ambulatory Visit: Payer: Self-pay | Admitting: Family Medicine

## 2012-06-29 ENCOUNTER — Other Ambulatory Visit: Payer: Self-pay | Admitting: Family Medicine

## 2012-07-04 ENCOUNTER — Other Ambulatory Visit: Payer: Self-pay | Admitting: *Deleted

## 2012-07-04 MED ORDER — GLIPIZIDE 10 MG PO TABS: 10.0000 mg | ORAL_TABLET | Freq: Two times a day (BID) | ORAL | Status: DC

## 2012-07-10 ENCOUNTER — Ambulatory Visit (INDEPENDENT_AMBULATORY_CARE_PROVIDER_SITE_OTHER): Payer: Self-pay | Admitting: Family Medicine

## 2012-07-10 ENCOUNTER — Encounter: Payer: Self-pay | Admitting: Family Medicine

## 2012-07-10 VITALS — BP 125/80 | HR 88 | Ht 64.0 in | Wt 154.0 lb

## 2012-07-10 DIAGNOSIS — E119 Type 2 diabetes mellitus without complications: Secondary | ICD-10-CM

## 2012-07-10 DIAGNOSIS — E785 Hyperlipidemia, unspecified: Secondary | ICD-10-CM

## 2012-07-10 MED ORDER — GLIPIZIDE 10 MG PO TABS: 10.0000 mg | ORAL_TABLET | Freq: Two times a day (BID) | ORAL | Status: DC

## 2012-07-10 MED ORDER — METFORMIN HCL 1000 MG PO TABS: 1000.0000 mg | ORAL_TABLET | Freq: Two times a day (BID) | ORAL | Status: DC

## 2012-07-10 NOTE — Patient Instructions (Addendum)
Continue with your diabetic medicine. Make sure and take the insulin sliding scale at breakfast if the sugars run high.  Make an appointment for the lab in the morning so that we can check your cholesterol. Please be fasting (no food or drink other than water) after midnight the night before.

## 2012-07-11 NOTE — Assessment & Plan Note (Addendum)
A1C: 8.8 in June 2013. Will continue with current management and mild increase in sliding scale at meal time. If repeat A1C is not improved at next visit, will consider switching to long acting insulin.  parasthesias patient was experiencing could be secondary to diabetic neuropathy. Will continue to monitor.  Sent referral for diabetic eye exam, although with orange card, there may not be available slots.

## 2012-07-11 NOTE — Assessment & Plan Note (Addendum)
Currently on crestor 10mg  daily. Will repeat fasting lipid with CMP. If elevated, can increase dose.

## 2012-07-11 NOTE — Progress Notes (Signed)
Patient ID: [name], female   DOB: Jan 13, 1956, 56 y.o.   MRN: [id number] Patient ID: [name]    DOB: Dec 26, 1955, 56 y.o.   MRN: [id number] --- Subjective:  [name] is a 56 y.o.female who presents for follow up on diabetes. - diabetes: on metformin 1000mg  bid and glipizide 10mg  bid as well as novolog sliding scale that she takes mostly at lunch time. Her CBG's have been running in the 130's fasting. No hypoglycemic episodes.  - She reports having felt some tingling in her left foot last month, which subsided after she changed jobs and wasn't on her feet as much.    ROS: see HPI Past Medical History: reviewed and updated medications and allergies. Social History: Tobacco: snuff   Objective: Filed Vitals:   07/10/12 1342  BP: 125/80  Pulse: 88    Physical Examination:   General appearance - alert, well appearing, and in no distress Mouth - mucous membranes moist, pharynx normal without lesions Neck - supple, no significant adenopathy Chest - clear to auscultation, no wheezes, rales or rhonchi, symmetric air entry Heart - normal rate, regular rhythm, normal S1, S2, no murmurs, rubs, clicks or gallops Abdomen - soft, nontender, nondistended, no masses or organomegaly Extremities - peripheral pulses normal, sensation to light touch intact in both extremities bilaterally

## 2012-07-20 ENCOUNTER — Other Ambulatory Visit: Payer: Self-pay

## 2012-07-20 DIAGNOSIS — E119 Type 2 diabetes mellitus without complications: Secondary | ICD-10-CM

## 2012-07-20 LAB — LIPID PANEL
Cholesterol: 146 mg/dL (ref 0–200)
Total CHOL/HDL Ratio: 2.1 Ratio
Triglycerides: 75 mg/dL (ref <150)
VLDL: 15 mg/dL (ref 0–40)

## 2012-07-20 LAB — COMPLETE METABOLIC PANEL WITH GFR
AST: 15 U/L (ref 0–37)
Albumin: 4.5 g/dL (ref 3.5–5.2)
Alkaline Phosphatase: 69 U/L (ref 39–117)
BUN: 19 mg/dL (ref 6–23)
Calcium: 10.3 mg/dL (ref 8.4–10.5)
Chloride: 99 mEq/L (ref 96–112)
Creat: 0.77 mg/dL (ref 0.50–1.10)
GFR, Est Non African American: 87 mL/min
Glucose, Bld: 58 mg/dL — ABNORMAL LOW (ref 70–99)

## 2012-07-20 NOTE — Progress Notes (Signed)
CMP AND FLP DONE TODAY [name] 

## 2012-07-24 ENCOUNTER — Encounter: Payer: Self-pay | Admitting: Family Medicine

## 2012-08-08 ENCOUNTER — Other Ambulatory Visit: Payer: Self-pay | Admitting: Family Medicine

## 2012-08-20 ENCOUNTER — Other Ambulatory Visit: Payer: Self-pay | Admitting: *Deleted

## 2012-08-20 DIAGNOSIS — I10 Essential (primary) hypertension: Secondary | ICD-10-CM

## 2012-08-20 MED ORDER — HYDROCHLOROTHIAZIDE 25 MG PO TABS: 12.5000 mg | ORAL_TABLET | Freq: Every day | ORAL | Status: DC

## 2012-08-28 ENCOUNTER — Other Ambulatory Visit: Payer: Self-pay | Admitting: Family Medicine

## 2012-09-12 ENCOUNTER — Ambulatory Visit: Payer: Self-pay | Admitting: Family Medicine

## 2012-10-03 ENCOUNTER — Ambulatory Visit: Payer: Self-pay | Admitting: Family Medicine

## 2012-10-15 ENCOUNTER — Encounter: Payer: Self-pay | Admitting: Family Medicine

## 2012-10-15 ENCOUNTER — Ambulatory Visit (INDEPENDENT_AMBULATORY_CARE_PROVIDER_SITE_OTHER): Payer: Self-pay | Admitting: Family Medicine

## 2012-10-15 VITALS — BP 136/78 | HR 105 | Temp 99.9°F | Ht 64.0 in | Wt 150.4 lb

## 2012-10-15 DIAGNOSIS — Z23 Encounter for immunization: Secondary | ICD-10-CM

## 2012-10-15 DIAGNOSIS — I10 Essential (primary) hypertension: Secondary | ICD-10-CM

## 2012-10-15 DIAGNOSIS — E119 Type 2 diabetes mellitus without complications: Secondary | ICD-10-CM | POA: Insufficient documentation

## 2012-10-15 LAB — POCT GLYCOSYLATED HEMOGLOBIN (HGB A1C): Hemoglobin A1C: 8.3

## 2012-10-15 MED ORDER — GLIPIZIDE 10 MG PO TABS: 10.0000 mg | ORAL_TABLET | Freq: Two times a day (BID) | ORAL | Status: DC

## 2012-10-15 NOTE — Progress Notes (Signed)
Patient ID: [name]    DOB: 12/19/1955, 56 y.o.   MRN: [id number] --- Subjective:  [name] is a 56 y.o.female with h/o HTN, DM, HLD  who presents for follow up on DM.  - DM2: on metformin 1000mg  bid and glipizide 10mg  bid as well as novolog sliding scale at meal time. Patient reports that she has been out of her strips and needles for a month and has therefore not been giving herself any novolog. Prior to this her CBG's were running 63-115 fasting in the AM and 91-216 post prandial in the evening. She denies any signs of hypoglycemia with CBG's in the 60's. She reports that she has not had any nausea, shakiness or jitteriness in 2-3 months and when she did, she would respond well to juice. She denies any tingling or loss of sensation in lower extremities. No blurred vision.   - HTN: no chest pain, no shortness of breath, no lower extremity swelling.    ROS: see HPI Past Medical History: reviewed and updated medications and allergies. Social History: Tobacco:  Objective: Filed Vitals:   10/15/12 1411  BP: 136/78  Pulse: 105  Temp: 99.9 F (37.7 C)    Physical Examination:   General appearance - alert, well appearing, and in no distress Chest - clear to auscultation, no wheezes, rales or rhonchi, symmetric air entry Heart - normal rate, regular rhythm, normal S1, S2, no murmurs Abdomen - soft, nontender, nondistended, no masses or organomegaly Extremities - peripheral pulses normal, no pedal edema Microfilament diabetic foot exam:  No ulcers or calices, no skin breaks or tears.  Sensation intact to microfilament.

## 2012-10-15 NOTE — Assessment & Plan Note (Signed)
Repeat BP was 124/80. Continue with current management with HCTZ 12,5mg  daily, quinapril 40mg  daily and amlodipine 10mg  daily.

## 2012-10-15 NOTE — Assessment & Plan Note (Signed)
Patient not on novolog for 1 month. A1C at 8.3 better than at last visit at 8.8. CBG's prior to patient running out of novolog were within good range fasting: 63-115. Will continue with novolog sliding scale 3-5 units at meal times with instructions for hypoglycemia. Continue glipizide 10mg  bid and metformin 1000mg  bid. Will follow up in 1 month after patient has been back on novolog. Will review CBG's then and adjust accordingly.

## 2012-10-15 NOTE — Patient Instructions (Addendum)
[name] was 8.3 today, which was better than last time. Start back on your novolog at meal time: 3-5 units with sliding scale instead of 2-4 units. If you start having feelings of shakiness, nausea, check your sugar. If your sugar is less than 65, drink juice or eat a piece of hard candy.   I'd like to see you back in 1 month with your log of sugars to see how things are going.

## 2012-10-18 ENCOUNTER — Emergency Department (HOSPITAL_COMMUNITY)
Admission: EM | Admit: 2012-10-18 | Discharge: 2012-10-19 | Disposition: A | Discharge status: Home / Self Care | Payer: No Typology Code available for payment source | Attending: Emergency Medicine | Admitting: Emergency Medicine

## 2012-10-18 ENCOUNTER — Encounter (HOSPITAL_COMMUNITY): Payer: Self-pay | Admitting: *Deleted

## 2012-10-18 ENCOUNTER — Other Ambulatory Visit: Payer: Self-pay

## 2012-10-18 DIAGNOSIS — M545 Low back pain, unspecified: Secondary | ICD-10-CM | POA: Insufficient documentation

## 2012-10-18 DIAGNOSIS — E785 Hyperlipidemia, unspecified: Secondary | ICD-10-CM | POA: Insufficient documentation

## 2012-10-18 DIAGNOSIS — Z862 Personal history of diseases of the blood and blood-forming organs and certain disorders involving the immune mechanism: Secondary | ICD-10-CM | POA: Insufficient documentation

## 2012-10-18 DIAGNOSIS — Z794 Long term (current) use of insulin: Secondary | ICD-10-CM | POA: Insufficient documentation

## 2012-10-18 DIAGNOSIS — E119 Type 2 diabetes mellitus without complications: Secondary | ICD-10-CM | POA: Insufficient documentation

## 2012-10-18 DIAGNOSIS — Z79899 Other long term (current) drug therapy: Secondary | ICD-10-CM | POA: Insufficient documentation

## 2012-10-18 DIAGNOSIS — Z7982 Long term (current) use of aspirin: Secondary | ICD-10-CM | POA: Insufficient documentation

## 2012-10-18 DIAGNOSIS — Z9889 Other specified postprocedural states: Secondary | ICD-10-CM | POA: Insufficient documentation

## 2012-10-18 DIAGNOSIS — T50905A Adverse effect of unspecified drugs, medicaments and biological substances, initial encounter: Secondary | ICD-10-CM

## 2012-10-18 DIAGNOSIS — Z8679 Personal history of other diseases of the circulatory system: Secondary | ICD-10-CM | POA: Insufficient documentation

## 2012-10-18 DIAGNOSIS — Z8719 Personal history of other diseases of the digestive system: Secondary | ICD-10-CM | POA: Insufficient documentation

## 2012-10-18 DIAGNOSIS — I10 Essential (primary) hypertension: Secondary | ICD-10-CM | POA: Insufficient documentation

## 2012-10-18 DIAGNOSIS — Z8673 Personal history of transient ischemic attack (TIA), and cerebral infarction without residual deficits: Secondary | ICD-10-CM | POA: Insufficient documentation

## 2012-10-18 NOTE — ED Notes (Signed)
Pt with chronic back pain which was increased today.  Pt took 2 flexeril (her prescription calls for 1).  Pt began to feel weak as well as jittery with rapid HR (no CP or sob) after taking medication.  Pt denies any rapid HR at this time and states that she feels a bit sleepy and sedated at this time.  Pt is alert and oriented, back pain is less

## 2012-10-19 NOTE — ED Provider Notes (Signed)
History     CSN: [id number]  Arrival date & time 10/18/12  2118   None     Chief Complaint  Patient presents with  . Back Pain    (Consider location/radiation/quality/duration/timing/severity/associated sxs/prior treatment) HPI History provided by pt.   Pt developed constant, non-radiating pain in left low back yesterday evening.  No associated fever, LLE weakness/paresthesias, urinary sx or bowel dysfunction.  No recent trauma.  Has had similar pain in the past.  Took 2 flexeril and pain improved, but approx 1 hour later, she developed intermittent episodes of sensation of skin crawling, lightheadedness, tachycardia, nausea, blurred vision and unsteadiness on her feet.  Felt as though she was drunk.   Symptoms lasted a couple minutes at a time, seemed to improve with walking around and she hs been asx since arriving in ED.  Denies headache, extremity weakness, chest pain, SOB, abdominal pain, diarrhea.  Has had one panic attack in the past but it presented differently.   Past Medical History  Diagnosis Date  . Diabetes mellitus   . Hyperlipidemia   . Hypertension   . Anemia   . Stroke     2008 - right arm/leg numbness.     . Benign heart murmur   . Gastric ulcer   . History of colonoscopy     Past Surgical History  Procedure Date  . Endometrial biopsy     Hx of endometrial Bx 2004 - negative for hyperplasia or malignancy. Patient does not remember reason for testing.  . Colonoscopy 01/13/2004    Colonoscopy - normal, diverticulosis   . Btl 1988  . L and r breast lumpectomies benign 1970  . Esophagogastroduodenoscopy     normal    Family History  Problem Relation Age of Onset  . Diabetes Mother   . Alcohol abuse Father   . Heart disease Father   . Diabetes Sister     History  Substance Use Topics  . Smoking status: Never Smoker   . Smokeless tobacco: Current User    Types: Snuff  . Alcohol Use: Yes     Comment: hx excessive ETOH, quit 02/2007    OB History     Grav Para Term Preterm Abortions TAB SAB Ect Mult Living                  Review of Systems  All other systems reviewed and are negative.    Allergies  Review of patient's allergies indicates no known allergies.  Home Medications   Current Outpatient Rx  Name  Route  Sig  Dispense  Refill  . AMLODIPINE BESYLATE 10 MG PO TABS   Oral   Take 10 mg by mouth daily.           . ASPIRIN 81 MG PO TABS   Oral   Take 81 mg by mouth daily.         Marland Kitchen GLIPIZIDE 10 MG PO TABS   Oral   Take 1 tablet (10 mg total) by mouth 2 (two) times daily before a meal.   60 tablet   4     CYCLE FILL MEDICATION. Authorization is required f ...   . HYDROCHLOROTHIAZIDE 25 MG PO TABS   Oral   Take 0.5 tablets (12.5 mg total) by mouth daily.   15 tablet   6   . INSULIN ASPART 100 UNIT/ML [place] SOLN      Inject 2-4 units per sliding scale after breakfast, lunch, and dinner.         Marland Kitchen  METFORMIN HCL 1000 MG PO TABS   Oral   Take 1 tablet (1,000 mg total) by mouth 2 (two) times daily with a meal.   90 tablet   6   . QUINAPRIL HCL 40 MG PO TABS   Oral   Take 40 mg by mouth daily.          Marland Kitchen ROSUVASTATIN CALCIUM 10 MG PO TABS   Oral   Take 10 mg by mouth daily.           Marland Kitchen GLUCOSE BLOOD VI STRP      Freestyle Freedom Strips. Check sugars 4 times a day.          . INSULIN SYRINGE-NEEDLE U-100 30G X 1/2" 0.5 ML MISC      daily use of insulin 1 box - supply for one month          . LANCETS FINE 28G MISC      Check sugars twice a day            BP 123/89  Pulse 108  Temp 98.4 F (36.9 C) (Oral)  Resp 17  SpO2 100%  Physical Exam  Nursing note and vitals reviewed. Constitutional: She is oriented to person, place, and time. She appears well-developed and well-nourished. No distress.  HENT:  Head: Normocephalic and atraumatic.  Eyes:       Normal appearance  Neck: Normal range of motion.  Cardiovascular: Normal rate, regular rhythm and intact distal pulses.    Pulmonary/Chest: Effort normal and breath sounds normal.  Musculoskeletal: Normal range of motion.       Lumbar spine non-tender.  Tenderness left SI joint.  Full, non-painful ROM LLE.  Nml patellar reflexes.  No saddle anesthesia.  Distal sensation intact. 2+ DP pulses.   Neurological: She is alert and oriented to person, place, and time. No sensory deficit. Coordination normal.       CN 3-12 intact.  No nystagmus. 5/5 and equal upper and lower extremity strength.  No past pointing.  Nml gait.     Skin: Skin is warm and dry. No rash noted.  Psychiatric: She has a normal mood and affect. Her behavior is normal.    ED Course  Procedures (including critical care time)   Date: 10/19/2012  Rate: 114  Rhythm: sinus tachycardia  QRS Axis: normal  Intervals: normal  ST/T Wave abnormalities: normal  Conduction Disutrbances:none  Narrative Interpretation:   Old EKG Reviewed: none available   Labs Reviewed - No data to display No results found.   1. Low back pain   2. Medication adverse effect       MDM  56yo F presents w/ acute on chronic, non-traumatic L low back pain.  Took double her nml dose of flexeril and believes she had an adverse reaction; approx 1 hour later, she developed lightheadedness, tachycardia, skin crawling sensation, blurred vision and unsteadiness on feet.  Several 1-2 minute episodes over 2 hours that improved w/ walking.  No CP/SOB.  Currently asx and no focal neuro deficits nor abnormal gait on exam.  Sx are not consistent w/ TIA.  I believe patient had a medication reaction and/or anxiety attack (prior history).  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  Pt can not take NSAIDs so I recommended tylenol arthritis, ice and avoidance of aggravating activities.  Also recommended that she avoid taking flexeril in future.  Strict return precautions discussed.        Otilio Miu,  PA-C 10/19/12 1308

## 2012-10-19 NOTE — ED Notes (Signed)
PA at bedside walking the patient

## 2012-10-19 NOTE — ED Provider Notes (Signed)
Medical screening examination/treatment/procedure(s) were performed by non-physician practitioner and as supervising physician I was immediately available for consultation/collaboration.  Sunnie Nielsen, MD 10/19/12 [phone]

## 2012-11-27 ENCOUNTER — Ambulatory Visit: Payer: No Typology Code available for payment source | Admitting: Family Medicine

## 2012-12-07 ENCOUNTER — Encounter: Payer: Self-pay | Admitting: Family Medicine

## 2012-12-07 ENCOUNTER — Ambulatory Visit (INDEPENDENT_AMBULATORY_CARE_PROVIDER_SITE_OTHER): Payer: No Typology Code available for payment source | Admitting: Family Medicine

## 2012-12-07 VITALS — BP 132/76 | HR 106 | Temp 98.5°F | Ht 64.0 in | Wt 146.4 lb

## 2012-12-07 DIAGNOSIS — E119 Type 2 diabetes mellitus without complications: Secondary | ICD-10-CM

## 2012-12-07 DIAGNOSIS — G4700 Insomnia, unspecified: Secondary | ICD-10-CM

## 2012-12-07 MED ORDER — ZOLPIDEM TARTRATE 5 MG PO TABS: 5.0000 mg | ORAL_TABLET | Freq: Every evening | ORAL | Status: DC | PRN

## 2012-12-07 NOTE — Patient Instructions (Addendum)
We're continuing the metformin and glipizide for now. We'll stop the novolog for now, unless your sugar is more than 200. If it is more than 200, give yourself 3 units of novolog.  We'll check an A1C in 1 month.    Insomnia Insomnia is frequent trouble falling and/or staying asleep. Insomnia can be a long term problem or a short term problem. Both are common. Insomnia can be a short term problem when the wakefulness is related to a certain stress or worry. Long term insomnia is often related to ongoing stress during waking hours and/or poor sleeping habits. Overtime, sleep deprivation itself can make the problem worse. Every little thing feels more severe because you are overtired and your ability to cope is decreased. CAUSES   Stress, anxiety, and depression.  Poor sleeping habits.  Distractions such as TV in the bedroom.  Naps close to bedtime.  Engaging in emotionally charged conversations before bed.  Technical reading before sleep.  Alcohol and other sedatives. They may make the problem worse. They can hurt normal sleep patterns and normal dream activity.  Stimulants such as caffeine for several hours prior to bedtime.  Pain syndromes and shortness of breath can cause insomnia.  Exercise late at night.  Changing time zones may cause sleeping problems (jet lag). It is sometimes helpful to have someone observe your sleeping patterns. They should look for periods of not breathing during the night (sleep apnea). They should also look to see how long those periods last. If you live alone or observers are uncertain, you can also be observed at a sleep clinic where your sleep patterns will be professionally monitored. Sleep apnea requires a checkup and treatment. Give your caregivers your medical history. Give your caregivers observations your family has made about your sleep.  SYMPTOMS   Not feeling rested in the morning.  Anxiety and restlessness at bedtime.  Difficulty falling  and staying asleep. TREATMENT   Your caregiver may prescribe treatment for an underlying medical disorders. Your caregiver can give advice or help if you are using alcohol or other drugs for self-medication. Treatment of underlying problems will usually eliminate insomnia problems.  Medications can be prescribed for short time use. They are generally not recommended for lengthy use.  Over-the-counter sleep medicines are not recommended for lengthy use. They can be habit forming.  You can promote easier sleeping by making lifestyle changes such as:  Using relaxation techniques that help with breathing and reduce muscle tension.  Exercising earlier in the day.  Changing your diet and the time of your last meal. No night time snacks.  Establish a regular time to go to bed.  Counseling can help with stressful problems and worry.  Soothing music and white noise may be helpful if there are background noises you cannot remove.  Stop tedious detailed work at least one hour before bedtime. HOME CARE INSTRUCTIONS   Keep a diary. Inform your caregiver about your progress. This includes any medication side effects. See your caregiver regularly. Take note of:  Times when you are asleep.  Times when you are awake during the night.  The quality of your sleep.  How you feel the next day. This information will help your caregiver care for you.  Get out of bed if you are still awake after 15 minutes. Read or do some quiet activity. Keep the lights down. Wait until you feel sleepy and go back to bed.  Keep regular sleeping and waking hours. Avoid naps.  Exercise regularly.  Avoid distractions at bedtime. Distractions include watching television or engaging in any intense or detailed activity like attempting to balance the household checkbook.  Develop a bedtime ritual. Keep a familiar routine of bathing, brushing your teeth, climbing into bed at the same time each night, listening to  soothing music. Routines increase the success of falling to sleep faster.  Use relaxation techniques. This can be using breathing and muscle tension release routines. It can also include visualizing peaceful scenes. You can also help control troubling or intruding thoughts by keeping your mind occupied with boring or repetitive thoughts like the old concept of counting sheep. You can make it more creative like imagining planting one beautiful flower after another in your backyard garden.  During your day, work to eliminate stress. When this is not possible use some of the previous suggestions to help reduce the anxiety that accompanies stressful situations. MAKE SURE YOU:   Understand these instructions.  Will watch your condition.  Will get help right away if you are not doing well or get worse. Document Released: 10/28/2000 Document Revised: 01/23/2012 Document Reviewed: 11/28/2007 [facility] Patient Information 2013 [place], [place].

## 2012-12-07 NOTE — Progress Notes (Signed)
Patient ID: [name]    DOB: [birth year], 57 y.o.   MRN: [id number] --- Subjective:  [name] is a 57 y.o.female who presents for follow up on DM2.   - DM2:  Saturday, felt weak, nervous, jittery, confused and thought sugar was low. Ate ice cream and still felt bad. Called EMS and CBG 18.  Given IV dextrose. Peanut butter and jelly sandwich brought it back to 179. Didn't go to the ED.  tomatoe soup and pork chop sandwich the night before event. No insulin.  Since then, has not had any more lows.  AM fasting CBG: 88-145 since 11/29/12.  Night before eating: 63-200.  Novolog: am: 3 units if higher than 150.  Pm: 4 units if higher.  Has not been able to get steady food supply due to low income and no food stamps.   - trouble sleeping: given pills, thinks it was trazadone that made her feel "funny", didn't make her sleep.  TV cut off at 9pm. Trouble falling asleep and staying asleep. Wakes up in the middle. Thinks she sleeps 1 hour per night. No caffeine. No alcohol. No feelings of sadness, lack of interest. Does have some financial concerns that she thinks about when needs to sleep.   ROS: see HPI Past Medical History: reviewed and updated medications and allergies. Social History: Tobacco: never  Objective: Filed Vitals:   12/07/12 1501  BP: 132/76  Pulse: 106  Temp: 98.5 F (36.9 C)    Physical Examination:   General appearance - alert, well appearing, and in no distress

## 2012-12-09 NOTE — Assessment & Plan Note (Signed)
Sleep hygiene appears adequate with regular time to bed, no caffeine, no TV or other activities in bedroom other than sleep. trazadone has not helped previously. Will Rx limited amount of ambien. Highly encouraged patient to start incorporating more exercise and physical activity during the day time to help with sleep. Patient agreed to do some more walking once weather becomes more mild.

## 2012-12-09 NOTE — Assessment & Plan Note (Signed)
Recent hypoglycemic episode requiring EMS intervention. CBG of 18. Likely from poor po intake and too much insulin. No signs of infection that could have lead to this.  D/C novolog coverage unless CBG greater than 200 in which case, give 3 units.  If continues to have low CBG's, will decrease glipizide dose.  Patient states she now has juice and hard sweet candy in the house and in her purse in case hypoglycemic episode occurs again.

## 2013-01-04 ENCOUNTER — Ambulatory Visit (INDEPENDENT_AMBULATORY_CARE_PROVIDER_SITE_OTHER): Payer: No Typology Code available for payment source | Admitting: Family Medicine

## 2013-01-04 ENCOUNTER — Encounter: Payer: Self-pay | Admitting: Family Medicine

## 2013-01-04 VITALS — BP 130/80 | HR 100 | Ht 64.0 in | Wt 152.3 lb

## 2013-01-04 DIAGNOSIS — E119 Type 2 diabetes mellitus without complications: Secondary | ICD-10-CM

## 2013-01-04 LAB — POCT GLYCOSYLATED HEMOGLOBIN (HGB A1C): Hemoglobin A1C: 8.9

## 2013-01-04 MED ORDER — ZOLPIDEM TARTRATE 5 MG PO TABS: 5.0000 mg | ORAL_TABLET | Freq: Every evening | ORAL | Status: DC | PRN

## 2013-01-04 NOTE — Assessment & Plan Note (Signed)
Controled, continue HCTZ 25, amlodipine 10 and quinapril 40.

## 2013-01-04 NOTE — Assessment & Plan Note (Signed)
A1C increase from 8.3 on 12/2 to 8.9 today. Patient on maximum dose of metformin and glipizide with novolog sliding scale at meal time (3u if over 200 since recent hypoglycemic episode). Concern that oral agents are not allowing target goals. Talked with patient about possibility of switching to basal insulin and stopping oral agents. Concern of course, includes cost. Patient to make appointment with pharmacy clinic to discuss possibility of starting lantus with novolog meal coverage. Instructed patient to bring log of CBG's: fasting before breakfast and 2hr postprandial after lunch or dinner.

## 2013-01-04 NOTE — Patient Instructions (Addendum)
For the diabetes, follow up with the Pharmacy Clinic. Make the appointment as soon as is good for you.   Continue the metformin, glipizide and novolog for now until you meet with them.  Bring a log of the sugar before breakfast and 2 hours after dinnertime, or after lunch time.

## 2013-01-04 NOTE — Assessment & Plan Note (Signed)
Sleep improved with ambien. Continue ambien 5mg  as needed.

## 2013-01-04 NOTE — Progress Notes (Signed)
Patient ID: [name]    DOB: [birth year], 56 y.o.   MRN: [id number] --- Subjective:  [name] is a 57 y.o.female who presents for follow up with diabetes type ll and insomnia.  #. Diabetes The patient's HbA1C on today's visit is 8.9 and it was 8.3 on last check dated 10/15/2012. The patient admits to being compliant with Glipizide and Metformin in addition to her Novolog. Novolog is to be used when CBG >200 in which case she would take 3 U. The pt has a hx of hypoglycemia with a CBG of 18 but has reported no hypoglycemic events since last office visit and new Novolog guidelines. She has not been exercising and admits to eating diets high in fat and likes to add pasta to dinner frequently. She denies an excessive intake of carb rich foods.  #. Insomnia The patient has a hx of insomnia and was started on Ambien dated 12/07/2012. She reports that she has been sleeping very well with the Ambien taken qhs and will now sleep 9:30 PM - 5:30 AM without feeling groggy or having any side effects such as headaches or dizziness. She further feels she is more productive and her mood has improved.  #. General The patient notes she is feeling fine. Denies N/V/D, constipation, headaches, visual issues, abd pain, SOB, CP, falling/gait issues.  ROS: see HPI Past Medical History: reviewed and updated medications and allergies. Social History: Tobacco: none  Objective: Filed Vitals:   01/04/13 1352  BP: 130/80  Pulse: 100    Physical Examination:   General appearance - Alert, in no acute distress, pleasant Chest - Clear vesicular breath sounds bilaterally through lung fields to auscultation, no wheezes, symmetric air entry. Heart - RRR, normal S1, S2, no murmurs, rubs, clicks or gallops. Abdomen - soft, nontender, nondistended, no masses or organomegaly. Extremities - peripheral pulses normal, no pedal edema, no clubbing or cyanosis. Callus on R foot under 2nd toe.

## 2013-01-08 ENCOUNTER — Other Ambulatory Visit: Payer: Self-pay | Admitting: Family Medicine

## 2013-01-18 ENCOUNTER — Ambulatory Visit: Payer: No Typology Code available for payment source | Admitting: Pharmacist

## 2013-01-22 ENCOUNTER — Ambulatory Visit (HOSPITAL_COMMUNITY)
Admission: RE | Admit: 2013-01-22 | Discharge: 2013-01-22 | Disposition: A | Discharge status: Home / Self Care | Payer: No Typology Code available for payment source | Source: Ambulatory Visit | Attending: Family Medicine | Admitting: Family Medicine

## 2013-01-22 DIAGNOSIS — Z1231 Encounter for screening mammogram for malignant neoplasm of breast: Secondary | ICD-10-CM | POA: Insufficient documentation

## 2013-01-25 ENCOUNTER — Ambulatory Visit (INDEPENDENT_AMBULATORY_CARE_PROVIDER_SITE_OTHER): Payer: No Typology Code available for payment source | Admitting: Pharmacist

## 2013-01-25 ENCOUNTER — Encounter: Payer: Self-pay | Admitting: Pharmacist

## 2013-01-25 VITALS — BP 126/74 | HR 93 | Ht 64.0 in | Wt 152.0 lb

## 2013-01-25 DIAGNOSIS — E119 Type 2 diabetes mellitus without complications: Secondary | ICD-10-CM

## 2013-01-25 DIAGNOSIS — F17200 Nicotine dependence, unspecified, uncomplicated: Secondary | ICD-10-CM

## 2013-01-25 MED ORDER — INSULIN GLARGINE 100 UNIT/ML ~~LOC~~ SOLN: 4.0000 [IU] | Freq: Every day | SUBCUTANEOUS | Status: DC

## 2013-01-25 NOTE — Patient Instructions (Addendum)
It was good to see you today! Thank you for bringing your medications and blood sugar log with you to clinic.  Your blood sugar readings from first thing in the morning and right before dinner are good, but [name] is still higher than we would like. We think your blood sugar might be higher right after you eat.    STOP taking glipizide. Start using 4 units of Lantus every day. Continue taking metformin and Novolog 2-3 units as needed with meals. Check your blood sugar more frequently throughout the day (morning, between 10:30am and 11:00am, around 3:30pm, and 2 hours after your evening meal).   Follow-up in the clinic in about 1 month. Call the office if you see low blood glucose levels (85 or less) or if you have symptoms of hypoglycemia.

## 2013-01-25 NOTE — Assessment & Plan Note (Signed)
Diabetes of 9 yrs duration currently under fair control of blood glucose based on   Lab Results  Component Value Date   HGBA1C 8.9 01/04/2013    ,home fasting CBG readings of 80-100 mg/dl and pre-dinner CBG readings of 100-190 mg/dl. Meter results may represent slightly lower BG values based on A1c. Control is suboptimal presumably due to elevated post-prandial BG levels. Denies hypoglycemic events.  Able to verbalize appropriate hypoglycemia management plan. Discontinued glipizide. Started basal insulin Lantus (insulin glargine) at 4 units daily. Continued rapid insulin Novolog (insulin aspart) 2-4 units with meals. Continue metformin. Written patient instructions provided.  Follow up in  Pharmacist Clinic Visit in 1 month.   Total time in face to face counseling 25 minutes.  Patient seen with  Beatrix Shipper, PharmD candidate.

## 2013-01-25 NOTE — Assessment & Plan Note (Signed)
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   Encouraged quit date of 01/28/2013  F/U success at next Rx Clinic Visit   Total time in face-to-face counseling 10 minutes.

## 2013-01-25 NOTE — Progress Notes (Signed)
  Subjective:    Patient ID: [name], female    DOB: 11/29/1955, 57 y.o.   MRN: [id number]  HPI Patient arrives in good spirits for a diabetes follow-up. Was diagnosed with DM around 2005. Patient-reported BG readings range from 80-190 mg/dl, but most recent J4N is not at goal. Checks BG first thing in the morning and right before dinner, but has no post-prandial readings. She takes metformin, glipizide, and uses Novolog.  Long history of chewing tobacco since age 44. Reports having quit for 1.5 months in the past. Set quit date of 01/28/2013. Patient is confident that she will be able to quit for good (9 out of 10).   Review of Systems     Objective:   Physical Exam        Assessment & Plan:   Diabetes of 9 yrs duration currently under fair control of blood glucose based on   Lab Results  Component Value Date   HGBA1C 8.9 01/04/2013    ,home fasting CBG readings of 80-100 mg/dl and pre-dinner CBG readings of 100-190 mg/dl. Meter results may represent slightly lower BG values based on A1c. Control is suboptimal presumably due to elevated post-prandial BG levels. Denies hypoglycemic events.  Able to verbalize appropriate hypoglycemia management plan. Discontinued glipizide. Started basal insulin Lantus (insulin glargine) at 4 units daily. Continued rapid insulin Novolog (insulin aspart) 2-4 units with meals. Continue metformin. Written patient instructions provided.  Follow up in  Pharmacist Clinic Visit in 1 month.   Total time in face to face counseling 25 minutes.  Patient seen with  Beatrix Shipper, PharmD candidate.  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   Encouraged quit date of 01/28/2013  F/U success at next Rx Clinic Visit   Total time in face-to-face counseling 10 minutes.

## 2013-01-28 NOTE — Progress Notes (Signed)
Patient ID: [name], female   DOB: 1956/01/24, 57 y.o.   MRN: [id number] Reviewed: Agree with Dr. Macky Lower documentation and management.

## 2013-02-12 ENCOUNTER — Other Ambulatory Visit: Payer: Self-pay | Admitting: Family Medicine

## 2013-02-28 ENCOUNTER — Ambulatory Visit: Payer: No Typology Code available for payment source | Admitting: Pharmacist

## 2013-03-13 ENCOUNTER — Telehealth: Payer: Self-pay | Admitting: *Deleted

## 2013-03-13 NOTE — Telephone Encounter (Signed)
Received fax today from [facility].  Patient can not keep her eye appoinment scheduled for today due to being unable to afford the co-pay at this time.  Will resubmit referral again in May.  Ileana Ladd, CMA

## 2013-03-14 ENCOUNTER — Ambulatory Visit (INDEPENDENT_AMBULATORY_CARE_PROVIDER_SITE_OTHER): Payer: No Typology Code available for payment source | Admitting: Pharmacist

## 2013-03-14 ENCOUNTER — Encounter: Payer: Self-pay | Admitting: Pharmacist

## 2013-03-14 VITALS — BP 150/85 | HR 112 | Ht 65.0 in | Wt 156.0 lb

## 2013-03-14 DIAGNOSIS — E119 Type 2 diabetes mellitus without complications: Secondary | ICD-10-CM

## 2013-03-14 NOTE — Patient Instructions (Addendum)
Congratulations on quitting your chew tobacco!!  Continue taking Lantus 4 units every morning  Increase your Novolog: -For each meal (as long as it is decent sized): 3 units -If your blood sugar is greater than 180: 4 units -If your blood sugar is greater than 250: 5 units  Do not take insulin if you only eat a few peanut butter crackers  Come back to see Dr. Raymondo Band in early June

## 2013-03-14 NOTE — Progress Notes (Signed)
  Subjective:    Patient ID: [name], female    DOB: Oct 27, 1956, 57 y.o.   MRN: [id number]  HPI Patient arrives to pharmacy clinic in good spirits. She brings with her a blood glucose log and her medications.  She reports that she takes 4 units of Lantus every day and 2-4 units of Novolog depending on her blood glucose readings. She missed a week of insulin because she didn't have needles. She reports one episode of hypoglycemia during this time.   She reports that she quit using chew tobacco 01/28/13.     Review of Systems     Objective:   Physical Exam        Assessment & Plan:   Diabetes of 9 yrs duration currently under fair control of blood glucose based on   Lab Results  Component Value Date   HGBA1C 8.9 01/04/2013    ,home fasting CBG readings of 95-199 and random CBG readings of 113-300. Control is suboptimal due to dietary indiscretion and inadequate insulin doses. Denies hypoglycemic events.  Able to verbalize appropriate hypoglycemia management plan. Continued basal insulin Lantus (insulin glargine) of 4 units. Increased dose of rapid insulin Novolog (insulin aspart) to 3 units with each meal. If blood glucose >180, use 4 units and if >250, use 5 units.  Written patient instructions provided.  Follow up with Dr. Gwenlyn Saran in June.   Total time in face to face counseling 20 minutes.  Patient seen with Juanita Craver, PharmD Candidate.

## 2013-03-14 NOTE — Assessment & Plan Note (Signed)
Diabetes of 9 yrs duration currently under fair control of blood glucose based on   Lab Results  Component Value Date   HGBA1C 8.9 01/04/2013    ,home fasting CBG readings of 95-199 and random CBG readings of 113-300. Control is suboptimal due to dietary indiscretion and inadequate insulin doses. Denies hypoglycemic events.  Able to verbalize appropriate hypoglycemia management plan. Continued basal insulin Lantus (insulin glargine) of 4 units. Increased dose of rapid insulin Novolog (insulin aspart) to 3 units with each meal. If blood glucose >180, use 4 units and if >250, use 5 units.  Written patient instructions provided.  Follow up with Dr. Gwenlyn Saran in June.   Total time in face to face counseling 20 minutes.  Patient seen with Juanita Craver, PharmD Candidate.

## 2013-03-15 NOTE — Progress Notes (Signed)
Patient ID: [name], female   DOB: 09/24/1956, 57 y.o.   MRN: [id number] Reviewed: Agree with Dr. Koval's documentation and management. 

## 2013-04-15 ENCOUNTER — Other Ambulatory Visit: Payer: Self-pay | Admitting: Family Medicine

## 2013-04-17 ENCOUNTER — Other Ambulatory Visit: Payer: Self-pay | Admitting: *Deleted

## 2013-04-17 NOTE — Telephone Encounter (Signed)
Requested Prescriptions   Pending Prescriptions Disp Refills  . zolpidem (AMBIEN) 5 MG tablet 30 tablet 1    Sig: Take 1 tablet (5 mg total) by mouth at bedtime as needed for sleep.   Wyatt Haste, RN-BSN

## 2013-04-19 NOTE — Telephone Encounter (Signed)
Patient is calling because the pharmacy has not heard back yet on the sleeping meds and the patient would like these before the weekend.

## 2013-04-26 ENCOUNTER — Other Ambulatory Visit: Payer: Self-pay | Admitting: *Deleted

## 2013-04-26 NOTE — Telephone Encounter (Signed)
Fax authorization placed in your box - please fax when complete. Wyatt Haste, RN-BSN

## 2013-04-27 MED ORDER — ZOLPIDEM TARTRATE 5 MG PO TABS: 5.0000 mg | ORAL_TABLET | Freq: Every evening | ORAL | Status: DC | PRN

## 2013-05-06 ENCOUNTER — Encounter: Payer: Self-pay | Admitting: Family Medicine

## 2013-05-06 ENCOUNTER — Ambulatory Visit (INDEPENDENT_AMBULATORY_CARE_PROVIDER_SITE_OTHER): Payer: No Typology Code available for payment source | Admitting: Family Medicine

## 2013-05-06 VITALS — BP 151/92 | HR 105 | Temp 98.2°F | Wt 153.0 lb

## 2013-05-06 DIAGNOSIS — M545 Low back pain, unspecified: Secondary | ICD-10-CM

## 2013-05-06 MED ORDER — KETOROLAC TROMETHAMINE 60 MG/2ML IM SOLN
60.0000 mg | Freq: Once | INTRAMUSCULAR | Status: AC
  Administered 2013-05-06: 60 mg via INTRAMUSCULAR

## 2013-05-06 MED ORDER — MELOXICAM 15 MG PO TABS: 15.0000 mg | ORAL_TABLET | Freq: Every day | ORAL | Status: DC

## 2013-05-06 MED ORDER — METHOCARBAMOL 500 MG PO TABS: 500.0000 mg | ORAL_TABLET | Freq: Four times a day (QID) | ORAL | Status: DC | PRN

## 2013-05-06 NOTE — Addendum Note (Signed)
Addended by: Henri Medal on: 05/06/2013 09:36 AM   Modules accepted: Orders

## 2013-05-06 NOTE — Assessment & Plan Note (Signed)
Given toradol 60mg  IM.  Robaxin sent in (reports flexeril made her sick) as well as meloxicam.  No red flag symptoms.  If not improving could consider referral to PT.

## 2013-05-06 NOTE — Progress Notes (Signed)
  Subjective:    Patient ID: [name], female    DOB: November 20, 1955, 57 y.o.   MRN: [id number]  Back Pain    1. Back Pain:  Here with complaint of low back pain x4 days.  States that she was picking up her grandson last week and had pain afterwards.  Has had similar pain in the past that resolved on its own.  She denies radiation of pain, dysuria, fever, chills, bowel or bladder incontinence.    Review of Systems  Musculoskeletal: Positive for back pain.   Per HPI    Objective:   Physical Exam  Constitutional: She appears well-nourished. No distress.  Musculoskeletal:  ROM of back normal.  No bony tenderness or step off palpated.  Spasm of lower paraspinal muscles on the L, with associated tenderness.  Negative SLR.          Assessment & Plan:

## 2013-05-06 NOTE — Patient Instructions (Addendum)

## 2013-05-10 ENCOUNTER — Encounter: Payer: Self-pay | Admitting: Family Medicine

## 2013-05-10 ENCOUNTER — Ambulatory Visit (INDEPENDENT_AMBULATORY_CARE_PROVIDER_SITE_OTHER): Payer: No Typology Code available for payment source | Admitting: Family Medicine

## 2013-05-10 VITALS — BP 138/83 | HR 96 | Ht 64.0 in | Wt 153.3 lb

## 2013-05-10 DIAGNOSIS — I10 Essential (primary) hypertension: Secondary | ICD-10-CM

## 2013-05-10 DIAGNOSIS — E119 Type 2 diabetes mellitus without complications: Secondary | ICD-10-CM

## 2013-05-10 DIAGNOSIS — B070 Plantar wart: Secondary | ICD-10-CM | POA: Insufficient documentation

## 2013-05-10 DIAGNOSIS — M545 Low back pain: Secondary | ICD-10-CM

## 2013-05-10 LAB — POCT GLYCOSYLATED HEMOGLOBIN (HGB A1C): Hemoglobin A1C: 9.3

## 2013-05-10 MED ORDER — TRAMADOL HCL 50 MG PO TABS: 50.0000 mg | ORAL_TABLET | Freq: Three times a day (TID) | ORAL | Status: DC | PRN

## 2013-05-10 NOTE — Assessment & Plan Note (Signed)
Controled on hctz, accupril and amlodipine. Continue current treatment.

## 2013-05-10 NOTE — Assessment & Plan Note (Addendum)
Worsening with A1C of 9.3 compared to 8.9. Compliant with medication but I suspect dietary indiscretions. Increase lantus from 4units to 6 units. Patient to monitor morning fasting and lunch postprandial. Follow up in 3 weeks. If postprandial CBG's greater than 180, will increase to 4 units of novolog with meals.    Patient waiting for ophthalmology appointment that takes [facility] card in [place].

## 2013-05-10 NOTE — Progress Notes (Signed)
Patient ID: [name]    DOB: 28-Jan-1956, 57 y.o.   MRN: [id number] --- Subjective:  [name] is a 57 y.o.female who presents for follow up on HTN and DM2.  - HTN: taking bp medicine: hctz 25, accupril 40 and amlodipine 5mg . No side effetcs. Compliant with meds. No chest pain, no shortness of breath, no lower extremity swelling. Doesn't check BP at home  - DM2: takes metformin 1000mg  bid. Also takes lantus 4 units daily and novolog 3 units at mealtime. Compliant with meds. Started lantus in march.  No numbness or tingling in lower extremities. No hypoglycemic episodes. No change in vision. Is trying to be seen by ophthalmologist.   - foot discomfort: patient has noticed an area under her right foot that was thought to be a callus. A couple of weeks ago, it became white and peeled off. Discomfort improved after that. Worst with pressure on foot. Intermittent. No redness, no warmth to it. No fevers.   ROS: see HPI Past Medical History: reviewed and updated medications and allergies. Social History: Tobacco: former snuff user  Objective: Filed Vitals:   05/10/13 0919  BP: 138/83  Pulse: 96    Physical Examination:   General appearance - alert, well appearing, and in no distress Chest - clear to auscultation, no wheezes, rales or rhonchi, symmetric air entry Heart - normal rate, regular rhythm, normal S1, S2, 2/6 systolic murmur best heard at left upper sternal border Abdomen - soft, nontender, nondistended, no masses or organomegaly Extremities - no pedal edema Right foot - small (<0.50mm)  White, flat wart vs callus on plantar aspect of forefoot. No erythema, no warmth.  Normal microfilament testing bilaterally  A1C: 9.3 on 05/10/13 compared to 8.9 on 01/04/13

## 2013-05-10 NOTE — Patient Instructions (Addendum)
For your plantar wart, use over the counter salycilic acid or preparation H and apply a bandaid on top.   For the diabetes, increase your lantus to 6units, check your sugar in the morning before eating and after lunch time.  Follow up in 3 weeks.

## 2013-05-10 NOTE — Assessment & Plan Note (Signed)
Gave limited number of tramadol. Also reviewed not bending when picking up heavy things.

## 2013-05-10 NOTE — Assessment & Plan Note (Addendum)
Likely plantar wart. Treat with salycilic acid. If not better, consider shaving portion of wart. Reviewed infection red flags for return in diabetic patient.

## 2013-05-29 ENCOUNTER — Telehealth: Payer: Self-pay | Admitting: *Deleted

## 2013-05-29 NOTE — Telephone Encounter (Signed)
Received a call from [place] with [facility].  Ms. Kirshenbaum was scheduled to see Dr. Dione Booze on 05/25/2013 for a free eye exam and no showed.  She will not be allowed any specialty referrals thru [facility] ([facility]) for the next 12 months.  [facility]us will be mailing Ms. Adolf a letter informing her that she will not be eligible for specialty referrals until 05/2014.  Ileana Ladd

## 2013-05-31 ENCOUNTER — Ambulatory Visit: Payer: No Typology Code available for payment source | Admitting: Family Medicine

## 2013-06-14 ENCOUNTER — Ambulatory Visit (INDEPENDENT_AMBULATORY_CARE_PROVIDER_SITE_OTHER): Payer: No Typology Code available for payment source | Admitting: Family Medicine

## 2013-06-14 ENCOUNTER — Encounter: Payer: Self-pay | Admitting: Family Medicine

## 2013-06-14 VITALS — BP 132/79 | HR 92 | Ht 64.0 in | Wt 153.0 lb

## 2013-06-14 DIAGNOSIS — E119 Type 2 diabetes mellitus without complications: Secondary | ICD-10-CM

## 2013-06-14 MED ORDER — GLUCOSE BLOOD VI STRP: ORAL_STRIP | Status: DC

## 2013-06-14 MED ORDER — "INSULIN SYRINGE-NEEDLE U-100 30G X 1/2"" 0.5 ML MISC": 1.0000 | Freq: Three times a day (TID) | Status: DC

## 2013-06-14 NOTE — Progress Notes (Signed)
Patient ID: [name]    DOB: [birth year], 57 y.o.   MRN: [id number] --- Subjective:  [name] is a 58 y.o.female who presents for follow up on diabetes.  - Diabetes: taking lantus 6 units in the morning. novolog 3 units with meals. Larger meal is at night, but her CBG's tend to run higher at noon.  CBG's: fasting: low 100's in am, highest: 209, lowest 67. At noon: around 180's.  She states that last Monday morning she had a very bad "hot flash". She has regular hot flashes (that started when she first started menopause at the age of 26) that last a few minutes and go away, but this was different. She felt light headed, confused, sweaty and diaphoretic. She did not have any chest pain or shortness of breath. She had not eaten anything that morning. She had not given herself insulin. She did not check her CBG. She ate something and gave herself insulin like she normally does and felt better.    ROS: see HPI Past Medical History: reviewed and updated medications and allergies. Social History: Tobacco: former snuff user  Objective: Filed Vitals:   06/14/13 1025  BP: 132/79  Pulse: 92    Physical Examination:   General appearance - alert, well appearing, and in no distress Chest - clear to auscultation, no wheezes, rales or rhonchi, symmetric air entry Heart - normal rate, regular rhythm, normal S1, S2, 2/6 systolic murmur best heard at left sternal border. Lower extremities - no pedal edema, +2 dorsalis pedis pulse bilaterally, white flat wart punctate on right foot. No other calluses.Marland Kitchen

## 2013-06-14 NOTE — Patient Instructions (Addendum)
If you have a hot flash like you did last time, check your sugar. If it's less than 70, drink orange juice or eat a hard candy.   For now, we are keeping your lantus at 6units and your novolog at 3units. Please change from full sugar soda to diet!  See you back in September for A1C and PAP smear.

## 2013-06-16 NOTE — Assessment & Plan Note (Addendum)
Episode patient calls "hot flash" sounds more like a hypoglycemic episode. Instructed patient to check CBG if this happens again and to drink orange juiceor hard candy if CBG less than 70. She expressed understanding. For now, will not change insulin dose. Encouraged patient to stop drinking sweet beverages and try to replace with diet drinks if she must.  Follow up in September for A1C.  [facility]

## 2013-08-16 ENCOUNTER — Other Ambulatory Visit (HOSPITAL_COMMUNITY)
Admission: RE | Admit: 2013-08-16 | Discharge: 2013-08-16 | Disposition: A | Discharge status: Home / Self Care | Payer: No Typology Code available for payment source | Source: Ambulatory Visit | Attending: Family Medicine | Admitting: Family Medicine

## 2013-08-16 ENCOUNTER — Ambulatory Visit (INDEPENDENT_AMBULATORY_CARE_PROVIDER_SITE_OTHER): Payer: No Typology Code available for payment source | Admitting: Family Medicine

## 2013-08-16 ENCOUNTER — Encounter: Payer: Self-pay | Admitting: Family Medicine

## 2013-08-16 VITALS — BP 137/81 | HR 106 | Ht 64.0 in | Wt 152.0 lb

## 2013-08-16 DIAGNOSIS — Z124 Encounter for screening for malignant neoplasm of cervix: Secondary | ICD-10-CM

## 2013-08-16 DIAGNOSIS — R109 Unspecified abdominal pain: Secondary | ICD-10-CM

## 2013-08-16 DIAGNOSIS — Z1151 Encounter for screening for human papillomavirus (HPV): Secondary | ICD-10-CM | POA: Insufficient documentation

## 2013-08-16 DIAGNOSIS — Z01419 Encounter for gynecological examination (general) (routine) without abnormal findings: Secondary | ICD-10-CM | POA: Insufficient documentation

## 2013-08-16 DIAGNOSIS — E119 Type 2 diabetes mellitus without complications: Secondary | ICD-10-CM

## 2013-08-16 DIAGNOSIS — Z23 Encounter for immunization: Secondary | ICD-10-CM

## 2013-08-16 DIAGNOSIS — Z0000 Encounter for general adult medical examination without abnormal findings: Secondary | ICD-10-CM

## 2013-08-16 LAB — POCT GLYCOSYLATED HEMOGLOBIN (HGB A1C): Hemoglobin A1C: 8

## 2013-08-16 MED ORDER — POLYETHYLENE GLYCOL 3350 17 GM/SCOOP PO POWD: 17.0000 g | Freq: Two times a day (BID) | ORAL | Status: DC

## 2013-08-16 MED ORDER — GLYCERIN (LAXATIVE) 2 G RE SUPP: 1.0000 | Freq: Once | RECTAL | Status: DC

## 2013-08-16 NOTE — Patient Instructions (Addendum)
I think that the abdominal pain could be from constipation. I am sending a prescription for two types of medications: miralax to take twice a day and glycerin suppository. If the constipation and the pain do not resolve in the next 1-2 weeks, come back to see me.  It will also be very helpful, if you could get the records from [facility] of your last colonoscopy so that we can see whether we need to get another one.   I will send you a letter with the pap smear results.  Please make a lab appointment for next week to check cholesterol and basic labs. Come fasting: no food or drink other than water after midnight.   For the diabetes, when you get the novolog, start taking 6 units with your largest meal and continue 4-5 units with the other meals.  Follow up in 1 month to see how your sugars have been doing.

## 2013-08-17 DIAGNOSIS — Z01419 Encounter for gynecological examination (general) (routine) without abnormal findings: Secondary | ICD-10-CM | POA: Insufficient documentation

## 2013-08-17 DIAGNOSIS — R10A1 Flank pain, right side: Secondary | ICD-10-CM | POA: Insufficient documentation

## 2013-08-17 DIAGNOSIS — R109 Unspecified abdominal pain: Secondary | ICD-10-CM | POA: Insufficient documentation

## 2013-08-17 NOTE — Assessment & Plan Note (Addendum)
Constipation vs musculoskeletal. No evidence of adnexal or pelvic pain on exam.  - treat constipation with miralax bid and glycerin suppository - patient to obtain records of last colonoscopy as she might be due for one.  - patient to come for fasting labs which will include electrolytes and kidney function.  - red flags for return reviewed

## 2013-08-17 NOTE — Progress Notes (Signed)
Patient ID: REVELLA Hanson    DOB: February 01, 1956, 57 y.o.   MRN: [id number] --- Subjective:  [name] is a 57 y.o.female who presents for well woman exam and follow up on A1C. Her current concerns include the following:  - right mid abdominal/flank pain: started 2 weeks ago, sharp shooting, lasts 15-47minutes and subsides on its own or with tramadol. Not every day. No nausea or vomiting. Reports constipation ongoing for the last month with a bowel movement every week. She used to have BM every other day so this is new for her.  She cannot recall when was her last colonoscopy. She thinks it was at [facility]. She denies any blood in the stool.  Denies any dysuria or polyuria or hematuria. No fevers, no chills, no pain associated with eating.   - well woman visit:  Pap smears: no h/o abnormal pap smear, last PAP: 02/22/10 normal.  Mammogram: 3/1//14: normal Not sexually activefor 2 years No h/o STD Menopause in her mid 56's. No vaginal bleeding, no vaginal discharge  - diabetes: ran out of novolog insulin two weeks ago and awaits insulin prescription to [facility]. She uses lantus 6 units each morning. Normally, when taking novolog, she takes 4-5 units with meals.  CBG's: am: fasting: 90's-120's, no lower than 70 Evening fasting: 250's. Largest meal of the day being dinner time.  Has seen Dr. Dione Booze with ophthalmology for eye exam. No retinopathy.   ROS: see HPI Past Medical History: reviewed and updated medications and allergies. Social History: Tobacco: none  Objective: Filed Vitals:   08/16/13 0926  BP: 137/81  Pulse: 106    Physical Examination:   General appearance - alert, well appearing, and in no distress Neck - supple, no significant adenopathy Chest - clear to auscultation, no wheezes, rales or rhonchi, symmetric air entry Heart - normal rate, regular rhythm, normal S1, S2, no murmurs Abdomen - soft, nontender, nondistended, no masses or organomegaly Pelvic exam: normal  external genitalia, vulva, vagina, cervix, uterus and adnexa. Breasts: breasts appear normal, no suspicious masses, no skin or nipple changes or axillary nodes.  PAP smear done today

## 2013-08-17 NOTE — Assessment & Plan Note (Signed)
PAP smear done today No indication for STD screening Mammogram to be done in march 2015 Colonoscopy: patient to obtain records of last colonoscopy

## 2013-08-17 NOTE — Assessment & Plan Note (Addendum)
Improved from previous with A1C of 8.0 from 9.3: likely from increased exercise, decreased sweet drinks and insulin compliance overall (despite running ut of novolog in the last 2 weeks).  Increase novolog coverage for evening meal to 6 units and continue 4-5 units novolog during smaller meals.  Patient to return for fasting lipid panel and CMP.

## 2013-08-21 ENCOUNTER — Encounter: Payer: Self-pay | Admitting: Family Medicine

## 2013-08-23 ENCOUNTER — Other Ambulatory Visit: Payer: No Typology Code available for payment source

## 2013-08-23 DIAGNOSIS — E119 Type 2 diabetes mellitus without complications: Secondary | ICD-10-CM

## 2013-08-23 LAB — COMPREHENSIVE METABOLIC PANEL
ALT: <8 U/L (ref 0–35)
AST: 11 U/L (ref 0–37)
Alkaline Phosphatase: 74 U/L (ref 39–117)
BUN: 13 mg/dL (ref 6–23)
CO2: 30 mEq/L (ref 19–32)
Creat: 0.73 mg/dL (ref 0.50–1.10)
Sodium: 137 mEq/L (ref 135–145)
Total Bilirubin: 0.3 mg/dL (ref 0.3–1.2)
Total Protein: 7.3 g/dL (ref 6.0–8.3)

## 2013-08-23 LAB — CBC
MCH: 27.5 pg (ref 26.0–34.0)
MCHC: 33.2 g/dL (ref 30.0–36.0)
MCV: 82.8 fL (ref 78.0–100.0)
Platelets: 242 10*3/uL (ref 150–400)
RDW: 14.6 % (ref 11.5–15.5)

## 2013-08-23 LAB — LIPID PANEL
Cholesterol: 134 mg/dL (ref 0–200)
HDL: 54 mg/dL (ref >39)
LDL Cholesterol: 67 mg/dL (ref 0–99)
Total CHOL/HDL Ratio: 2.5 Ratio
VLDL: 13 mg/dL (ref 0–40)

## 2013-08-23 NOTE — Progress Notes (Signed)
CBC,CMP AND FLP DONE TODAY [name] 

## 2013-08-28 ENCOUNTER — Encounter: Payer: Self-pay | Admitting: Sports Medicine

## 2013-08-28 ENCOUNTER — Ambulatory Visit (INDEPENDENT_AMBULATORY_CARE_PROVIDER_SITE_OTHER): Payer: No Typology Code available for payment source | Admitting: Sports Medicine

## 2013-08-28 VITALS — BP 147/80 | HR 125 | Temp 98.7°F | Wt 154.0 lb

## 2013-08-28 DIAGNOSIS — S92919A Unspecified fracture of unspecified toe(s), initial encounter for closed fracture: Secondary | ICD-10-CM

## 2013-08-28 DIAGNOSIS — S92502A Displaced unspecified fracture of left lesser toe(s), initial encounter for closed fracture: Secondary | ICD-10-CM

## 2013-08-28 MED ORDER — TRAMADOL HCL 50 MG PO TABS: 50.0000 mg | ORAL_TABLET | Freq: Three times a day (TID) | ORAL | Status: DC | PRN

## 2013-08-28 MED ORDER — POST-OP SHOE/SOFT TOP WOMEN MISC: 1.0000 [IU] | Freq: Every day | Status: DC | PRN

## 2013-08-28 NOTE — Progress Notes (Signed)
[facility] [name] - 57 y.o. female MRN [id number]  Date of birth: 1956-01-22  CC, HPI, INTERVAL HISTORY & ROS  [name] is here today for evaluation of left fifth toe pain.    She reports she stubbed her toe yesterday while cleaning her house up.  She has had difficulty bearing weight on it since that time.  It has become swollen.  It hurts with any type of movement.  She has tried tramadol and that helps but she is now out.  She is on Mobic daily but doesn't notice it helps significantly.  History  Past Medical, Surgical, Social, and Family History Reviewed per EMR Medications and Allergies reviewed and all updated if necessary. Objective Findings  VITALS: HR: 125 bpm  BP: 147/80 mmHg  TEMP: 98.7 F (37.1 C) (Oral)  RESP:    HT:    WT: 154 lb (69.854 kg)  BMI:     BP Readings from Last 3 Encounters:  08/28/13 147/80  08/16/13 137/81  06/14/13 132/79   Wt Readings from Last 3 Encounters:  08/28/13 154 lb (69.854 kg)  08/16/13 152 lb (68.947 kg)  06/14/13 153 lb (69.4 kg)     PHYSICAL EXAM: GENERAL:  adult [place]  female. In no discomfort; no respiratory distress  PSYCH: alert and appropriate, good insight   Left foot Exam: Appear:  there is swelling and ecchymosis  Well aligned She is tenderness palpation over the fifth toe with crepitation isolated to the plan she As noted tenderness palpation over the metatarsals diffusely  No tenderness palpation over the ankle, base of the fifth, talar dome,  She has no restriction in range of motion of the entire foot but has pain with range of motion of the fifth toe.    Negative squeeze test  Capillary refill is less than 3 seconds diffusely   Limited muscular skeletal ultrasound performed and confirms a cortical disruption of the fifth proximal phalanges.       Assessment & Plan   Problems addressed today: General Plan & Pt Instructions:  1. Fracture of fifth toe, left, closed,  initial encounter       Buddy tape 4th and 5th toes for next 2-3 weeks  Tramadol as needed  Postop shoe for comfort  Followup if continuing to have pain after 2 weeks     For further discussion of A/P and for follow up issues see problem based charting if applicable.

## 2013-08-28 NOTE — Assessment & Plan Note (Addendum)
Ultrasound with evidence of fracture, will defer x-ray as will not change management.   There is no tenderness palpation over the metatarsals but she continues to have significant pain an x-ray will be warranted. Buddy tape toes Tramadol as needed - refilled today #20.  No further refills likely indicated Postop shoe for comfort, she reports are to having this but she was provided with a prescription to fill at medical supply store if unable to find hers at home.

## 2013-08-28 NOTE — Patient Instructions (Signed)
   Buddy tape 4th and 5th toes for next 2-3 weeks  Tramadol as needed  Postop shoe for comfort  Followup if continuing to have pain after 2 weeks   If you need anything prior to your next visit please call the clinic. Please Bring all medications or accurate medication list with you to each appointment; an accurate medication list is essential in providing you the best care possible.

## 2013-09-01 IMAGING — MG MM DIGITAL SCREENING BILAT
4 series · 4 of 4 positions shown · non-contrast
Comparison: Previous exams.

CLINICAL DATA: Screening.

DIGITAL BILATERAL SCREENING MAMMOGRAM WITH CAD

[R CC]
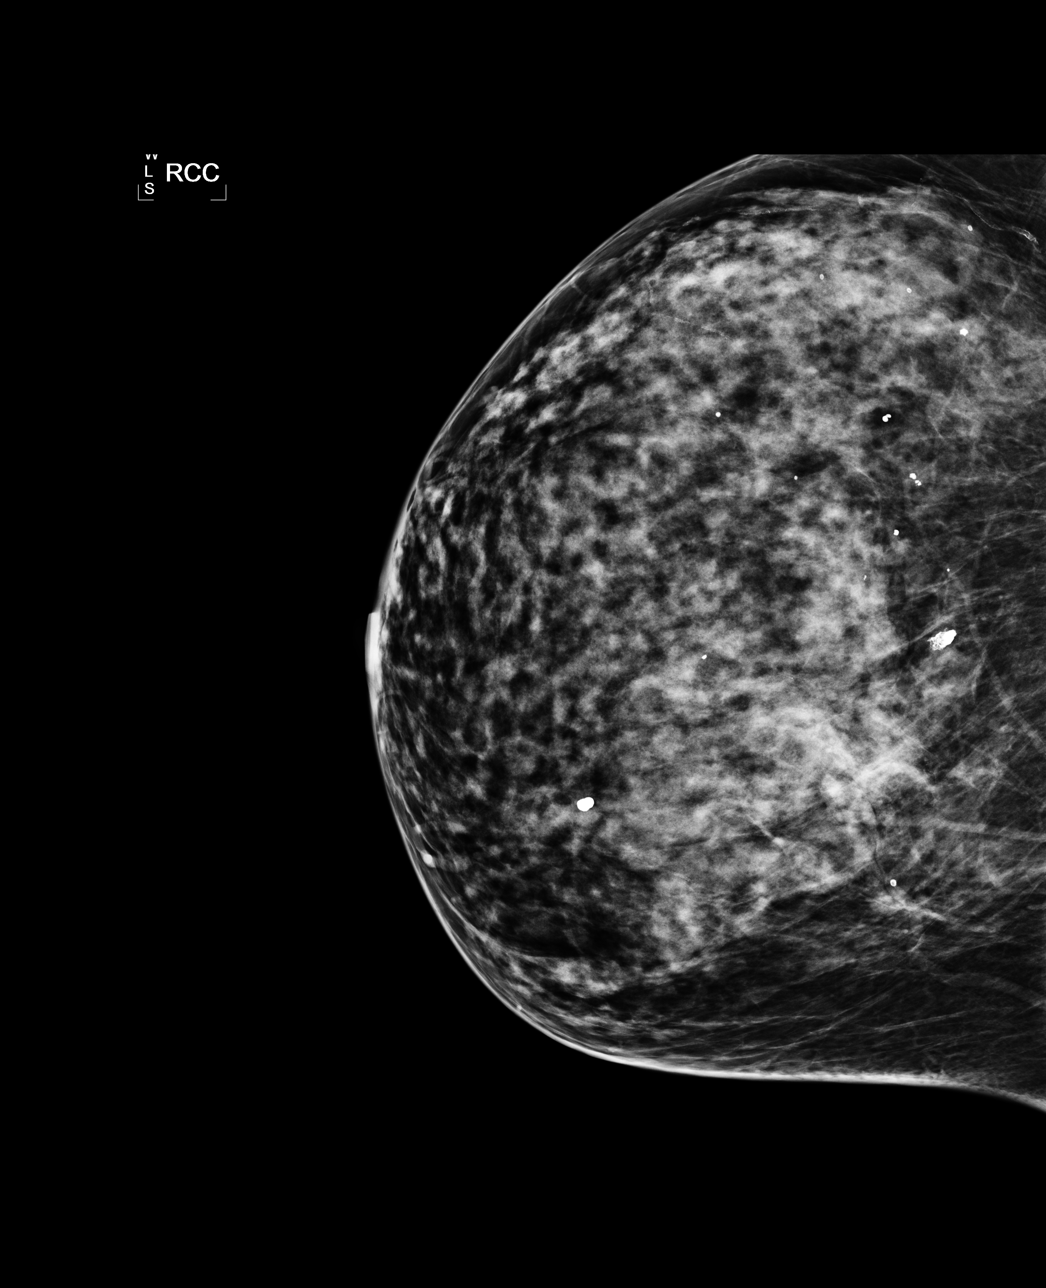

[R MLO]
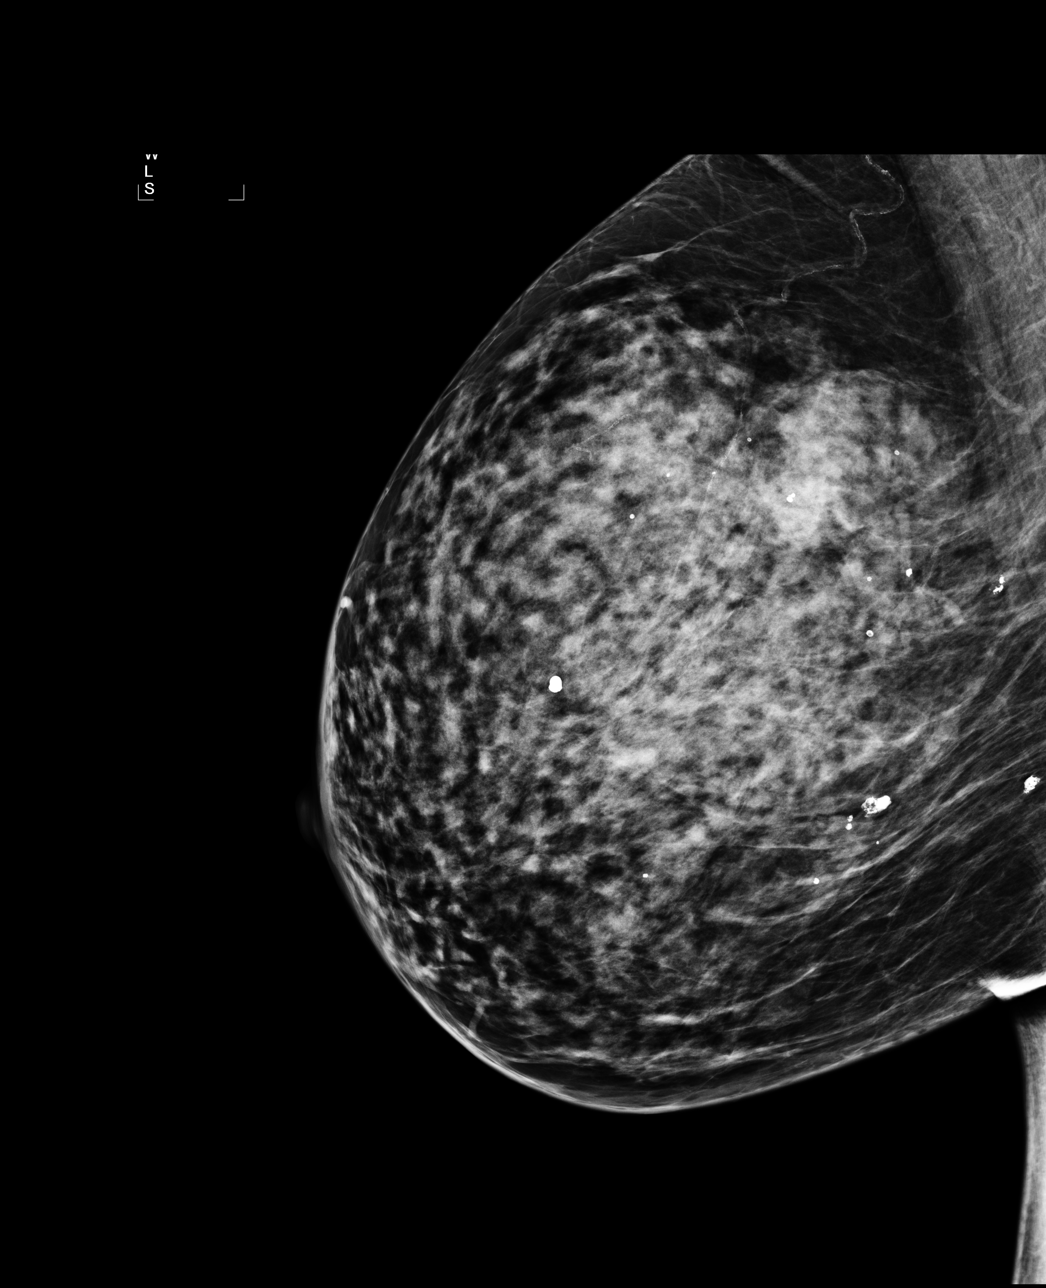

[L CC]
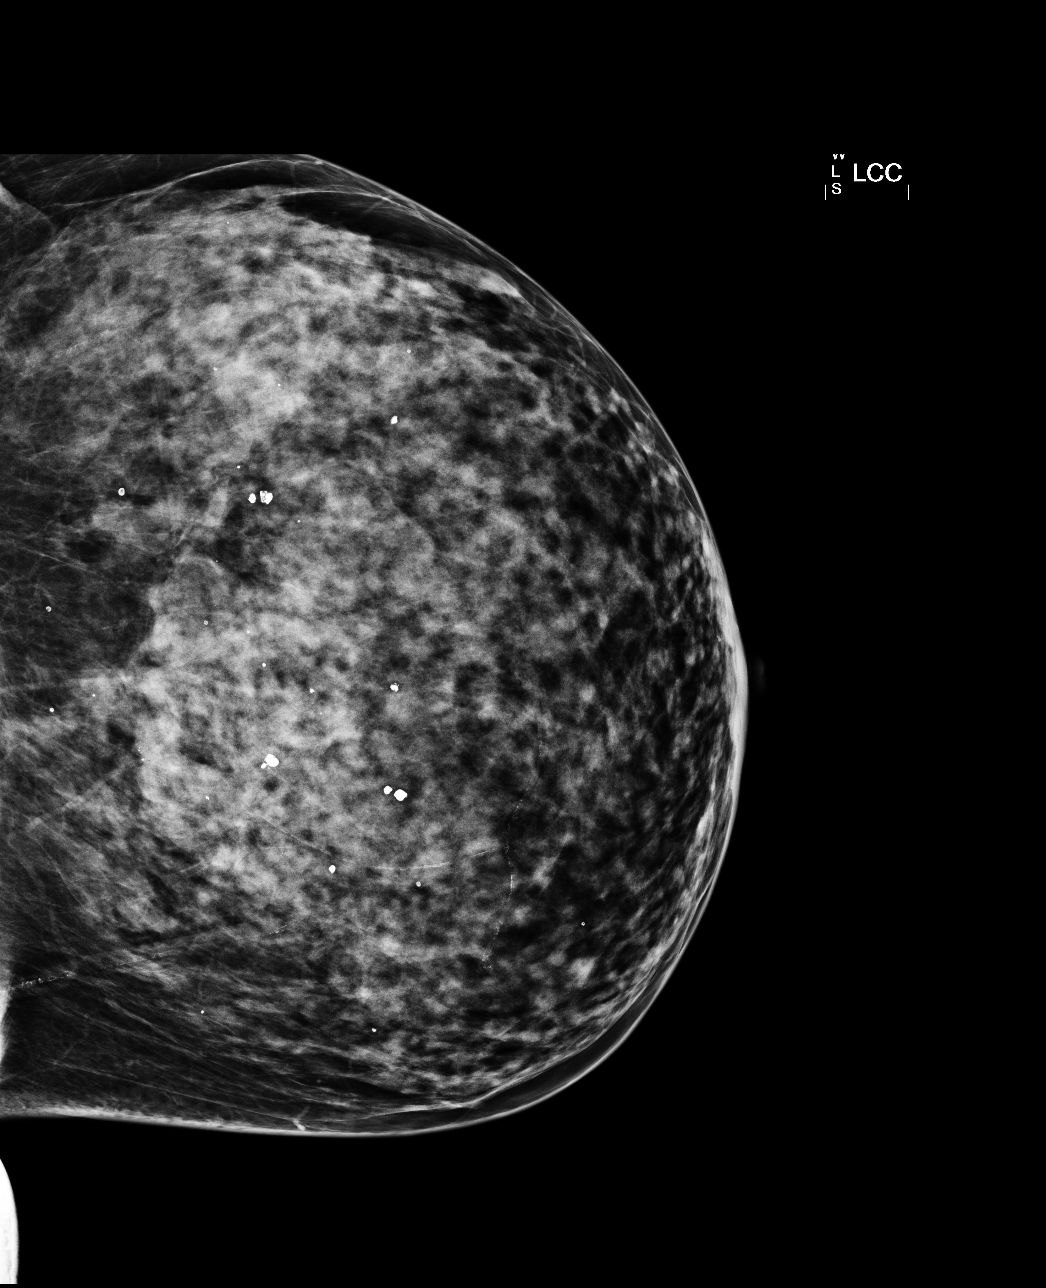

[L MLO]
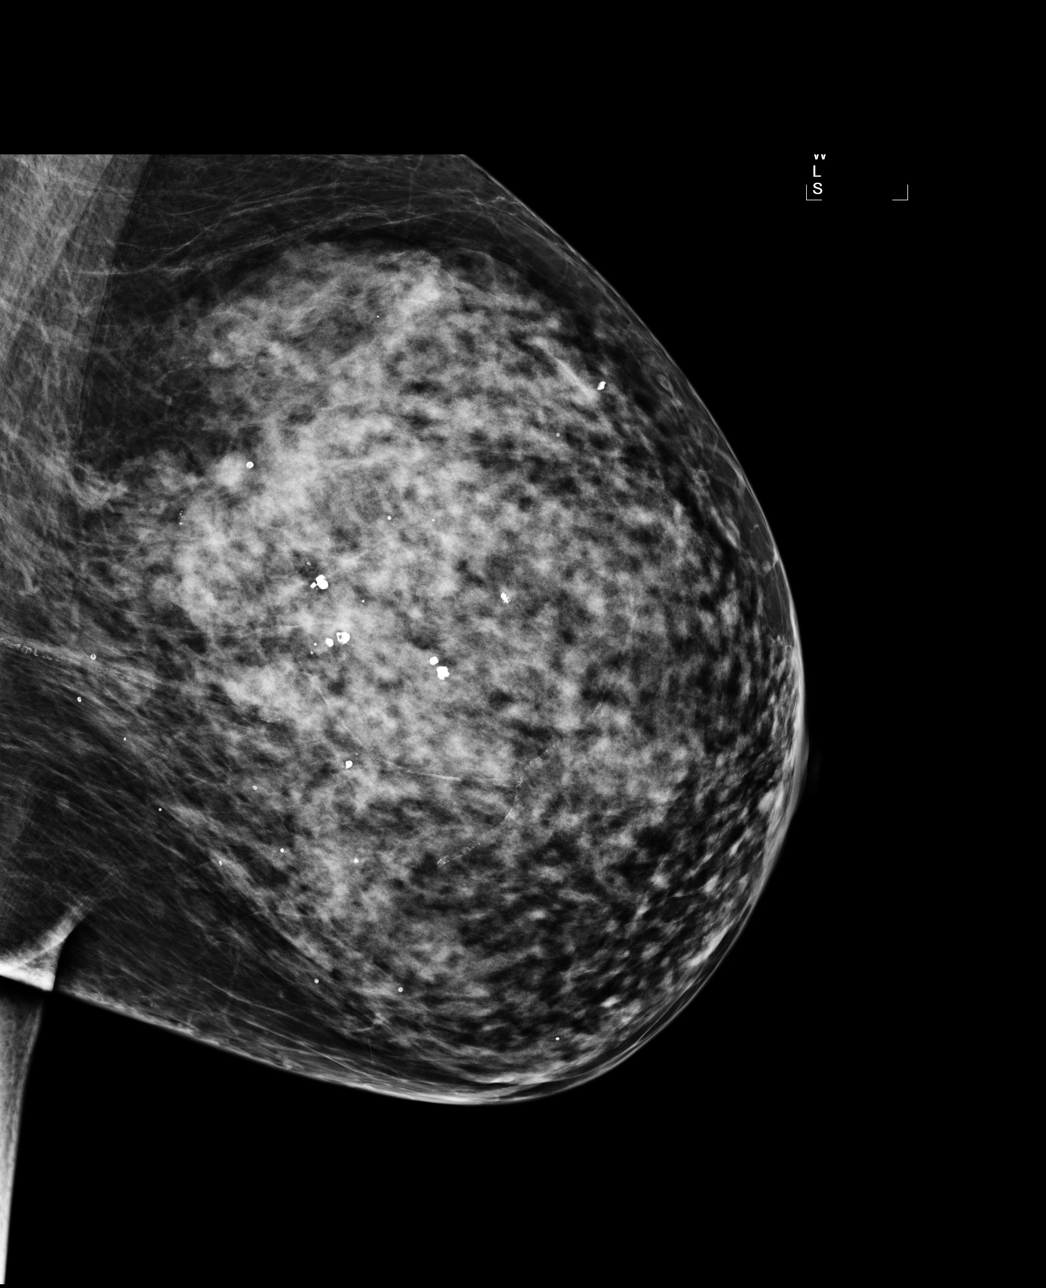

[4 of 4 positions shown; findings below may reference images not displayed]

FINDINGS: ACR Breast Density Category 4: The breast tissue is extremely
dense.

No suspicious masses, architectural distortion, or calcifications
are present.

Images were processed with CAD.
IMPRESSION: No mammographic evidence of malignancy.

A result letter of this screening mammogram will be mailed directly
to the patient.

RECOMMENDATION:
Screening mammogram in one year. (Code:[id number]-[id number])

BI-RADS CATEGORY 1:  Negative.

## 2013-09-13 ENCOUNTER — Other Ambulatory Visit: Payer: Self-pay | Admitting: Family Medicine

## 2013-09-19 ENCOUNTER — Telehealth: Payer: Self-pay | Admitting: Family Medicine

## 2013-09-19 ENCOUNTER — Ambulatory Visit (INDEPENDENT_AMBULATORY_CARE_PROVIDER_SITE_OTHER): Payer: No Typology Code available for payment source | Admitting: Family Medicine

## 2013-09-19 ENCOUNTER — Encounter: Payer: Self-pay | Admitting: Family Medicine

## 2013-09-19 VITALS — BP 119/72 | HR 90 | Ht 64.0 in | Wt 152.1 lb

## 2013-09-19 DIAGNOSIS — K5731 Diverticulosis of large intestine without perforation or abscess with bleeding: Secondary | ICD-10-CM

## 2013-09-19 DIAGNOSIS — S92502A Displaced unspecified fracture of left lesser toe(s), initial encounter for closed fracture: Secondary | ICD-10-CM

## 2013-09-19 DIAGNOSIS — E119 Type 2 diabetes mellitus without complications: Secondary | ICD-10-CM

## 2013-09-19 DIAGNOSIS — M2021 Hallux rigidus, right foot: Secondary | ICD-10-CM

## 2013-09-19 DIAGNOSIS — M2020 Hallux rigidus, unspecified foot: Secondary | ICD-10-CM

## 2013-09-19 MED ORDER — DICLOFENAC SODIUM 75 MG PO TBEC: 75.0000 mg | DELAYED_RELEASE_TABLET | Freq: Two times a day (BID) | ORAL | Status: DC | PRN

## 2013-09-19 MED ORDER — ZOLPIDEM TARTRATE 5 MG PO TABS: 5.0000 mg | ORAL_TABLET | Freq: Every evening | ORAL | Status: DC | PRN

## 2013-09-19 MED ORDER — ROSUVASTATIN CALCIUM 20 MG PO TABS: 20.0000 mg | ORAL_TABLET | Freq: Every day | ORAL | Status: DC

## 2013-09-19 NOTE — Telephone Encounter (Signed)
Pt called because the medication diclofenac that she prescribed is to expensive to buy. She would like something else called in to the [facility]. JW

## 2013-09-19 NOTE — Patient Instructions (Addendum)
For the diabetes, likely talked about, starts taking NovoLog 5 units at dinnertime when you feel like you're going to have a big meal. If your have a smaller meal stick with 3 units. If you have low sugars less than 70 go back to your regular dose of NovoLog 3 units. Keep taking 2 units at lunchtime. Continue with your Lantus as prescribed.  For the cholesterol we are increasing the Crestor to 20 mg.  For the colonoscopy, I will try to get the records.  For the toe, continue buddy taping it until it feels completely better. I will send a prescription medicine for the pain to take as needed when you have pain. Take it with food. Follow up in early January.

## 2013-09-20 NOTE — Telephone Encounter (Signed)
Pt called back today and wanted to know what the status is on getting something else. JW

## 2013-09-20 NOTE — Telephone Encounter (Signed)
Will fwd. To Dr.Losq. .Claborn Janusz  

## 2013-09-22 NOTE — Assessment & Plan Note (Signed)
Will need to get colonoscopy results from [facility] which is where patient thinks she may have had her colonoscopy

## 2013-09-22 NOTE — Assessment & Plan Note (Signed)
High CBG's at dinner time with largest meal. Will increase novolog coverage from 3 to 5units.  Instructions for titration in case of hypoglycemia given.

## 2013-09-22 NOTE — Assessment & Plan Note (Signed)
Improving.  Continue buddy taping Diclofenac for pain

## 2013-09-22 NOTE — Progress Notes (Signed)
Patient ID: [name]    DOB: 16-Apr-1956, 57 y.o.   MRN: [id number] --- Subjective:  [name] is a 57 y.o.female who presents for follow up on diabetes. - diabetes: CBG"s fasting: 130's in am, 160's in pm. Low of 67, high of 207. Symptomatic at 67, ate hard candy which helped.  novolog 3 units at dinner and 2 units at lunch Lantus: 4 units daily.  Biggest meal at night time.   - follow up on 5th toe fracture: occasional, random sharp pain. Otherwise, improved. Takes ibuprofen which doesn't help as much. Has been buddy taping toe. Is able to walk without pain  ROS: see HPI Past Medical History: reviewed and updated medications and allergies. Social History: Tobacco: none  Objective: Filed Vitals:   09/19/13 0901  BP: 119/72  Pulse: 90    Physical Examination:   General appearance - alert, well appearing, and in no distress Chest - clear to auscultation, no wheezes, rales or rhonchi, symmetric air entry Heart - normal rate, regular rhythm, normal S1, S2, no murmurs, rubs, clicks or gallops Left foot - no tenderness to palpation along 5th metatarsal, normal range of motion.

## 2013-09-24 MED ORDER — TRAMADOL HCL 50 MG PO TABS: 50.0000 mg | ORAL_TABLET | Freq: Three times a day (TID) | ORAL | Status: DC | PRN

## 2013-09-24 NOTE — Telephone Encounter (Signed)
Left message letting patient know that since she did not get benfit from mobic or ibuprofen and that ketorolac is too expensive, there are a limited number of options. Will leave Rx for tramadol 20tabs at front desk for her to pick up for acute foot pain in setting of broken toe.   Marena Chancy, PGY-3 Family Medicine Resident

## 2013-10-18 ENCOUNTER — Ambulatory Visit: Payer: Self-pay

## 2013-12-02 ENCOUNTER — Telehealth: Payer: Self-pay | Admitting: Family Medicine

## 2013-12-02 MED ORDER — OLMESARTAN MEDOXOMIL 20 MG PO TABS: 20.0000 mg | ORAL_TABLET | Freq: Every day | ORAL | Status: DC

## 2013-12-02 NOTE — Telephone Encounter (Signed)
Will fwd to MD.  [name] L, CMA  

## 2013-12-02 NOTE — Telephone Encounter (Signed)
Needs refill on ambien

## 2013-12-02 NOTE — Telephone Encounter (Signed)
Called pt. Informed. Agreed. .[name]  

## 2013-12-02 NOTE — Telephone Encounter (Signed)
Patient will need to be seen in clinic for refill on ambien. Can prescribe it to her when she is seen for follow up on BP on new medication. Please let patient know.  Thank you!  Marena ChancyStephanie Clydell Alberts, PGY-3 Family Medicine Resident

## 2013-12-02 NOTE — Telephone Encounter (Signed)
Received fax from [facility] letting me know that accupril was no longer covered for free and asking if I could change to benicar.  Patient has been on accupril 40mg  daily which is the highest dose. Will start benicar at starting dose of 20mg  daily. Once patient starts taking the new medicine, she needs to make an appointment with me in 2 weeks to recheck BP and labs.  I am faxing a new Rx for benicar 20mg  [facility]  Please let patient know that she will need to follow up with me 2 weeks after starting to take the new medicine.  Thank you!  Marena ChancyStephanie Geo Slone, PGY-3 Family Medicine Resident

## 2013-12-03 NOTE — Telephone Encounter (Signed)
Called pt. Informed. .[name]  

## 2013-12-06 ENCOUNTER — Ambulatory Visit (INDEPENDENT_AMBULATORY_CARE_PROVIDER_SITE_OTHER): Payer: No Typology Code available for payment source | Admitting: Family Medicine

## 2013-12-06 ENCOUNTER — Encounter: Payer: Self-pay | Admitting: Family Medicine

## 2013-12-06 VITALS — BP 137/61 | HR 98 | Temp 99.5°F | Ht 64.0 in | Wt 155.0 lb

## 2013-12-06 DIAGNOSIS — B379 Candidiasis, unspecified: Secondary | ICD-10-CM

## 2013-12-06 DIAGNOSIS — E119 Type 2 diabetes mellitus without complications: Secondary | ICD-10-CM

## 2013-12-06 DIAGNOSIS — R300 Dysuria: Secondary | ICD-10-CM

## 2013-12-06 LAB — POCT URINALYSIS DIPSTICK
BILIRUBIN UA: NEGATIVE
Blood, UA: NEGATIVE
GLUCOSE UA: 500
Leukocytes, UA: NEGATIVE
Nitrite, UA: NEGATIVE
Protein, UA: NEGATIVE
SPEC GRAV UA: 1.025
Urobilinogen, UA: 0.2
pH, UA: 7

## 2013-12-06 LAB — POCT GLYCOSYLATED HEMOGLOBIN (HGB A1C): HEMOGLOBIN A1C: 7.5

## 2013-12-06 MED ORDER — FLUCONAZOLE 100 MG PO TABS
100.0000 mg | ORAL_TABLET | Freq: Every day | ORAL | Status: DC
  Administered 2013-12-06: 100 mg via ORAL

## 2013-12-06 MED ORDER — FLUCONAZOLE 50 MG PO TABS: 100.0000 mg | ORAL_TABLET | Freq: Once | ORAL | Status: DC

## 2013-12-06 MED ORDER — FLUCONAZOLE 100 MG PO TABS: 100.0000 mg | ORAL_TABLET | Freq: Every day | ORAL | Status: DC

## 2013-12-06 MED ORDER — FLUCONAZOLE 100 MG PO TABS: 200.0000 mg | ORAL_TABLET | Freq: Once | ORAL | Status: DC

## 2013-12-06 NOTE — Progress Notes (Signed)
   Subjective:    Patient ID: [name], female    DOB: 1955-12-17, 58 y.o.   MRN: [id number]  HPI  58 year old F with DM2 who presents with dysuria and urinary frequency. She notes that is "painful" but does not burn and feels like she does have to urinate frequently. It has been worsening over the past 1 week. She has not tried anything for relief. However, she does have relief when she urinates. No fever, chills, nausea or vomiting. She did not some vaginal discharge that was brown yesterday. No sexual intercourse in several years.   PMH - Normal pap and neg HPV testing Oct 2014   Review of Systems     Objective:   Physical Exam BP 137/61  Pulse 98  Temp(Src) 99.5 F (37.5 C) (Oral)  Ht 5\' 4"  (1.626 m)  Wt 155 lb (70.308 kg)  BMI 26.59 kg/m2  Gen: well appearing middle aged female Back: no CVA tenderness Abd: soft, NDNT, no suprapubic pressure   Urinalysis negative for leukocytes, nitrites, and blood       Assessment & Plan:  Dysuria/Frequency - No evidence of infection on urinalysis; ddx includes yeast infection vs atrophic vaginitis vs. uterine mass placing pressure on bladder; will treat with diflucan (given 200 mg PO in office) for potential yeast infection and instructed to take azo to see if symptoms improve; f/u with PCP on Monday

## 2013-12-06 NOTE — Patient Instructions (Signed)
Ms. Fenton Mallinghorpe,   There is not evidence of a urinary tract infection no the urine test today. It may be a yeast infection. That is why I gave you the two tablets. Also, you can purchase Azo or pyridium at the pharmacy for relief.   Come back on Monday for your check up with Dr. Gwenlyn SaranLosq.   Sincerely,   Dr. Clinton SawyerWilliamson

## 2013-12-09 ENCOUNTER — Encounter: Payer: Self-pay | Admitting: Family Medicine

## 2013-12-09 ENCOUNTER — Ambulatory Visit (INDEPENDENT_AMBULATORY_CARE_PROVIDER_SITE_OTHER): Payer: No Typology Code available for payment source | Admitting: Family Medicine

## 2013-12-09 VITALS — BP 142/83 | HR 92 | Temp 98.4°F | Ht 64.0 in | Wt 154.0 lb

## 2013-12-09 DIAGNOSIS — I10 Essential (primary) hypertension: Secondary | ICD-10-CM

## 2013-12-09 DIAGNOSIS — G4700 Insomnia, unspecified: Secondary | ICD-10-CM

## 2013-12-09 DIAGNOSIS — E119 Type 2 diabetes mellitus without complications: Secondary | ICD-10-CM

## 2013-12-09 MED ORDER — INSULIN ASPART 100 UNIT/ML ~~LOC~~ SOLN: 2.0000 [IU] | Freq: Three times a day (TID) | SUBCUTANEOUS | Status: DC

## 2013-12-09 MED ORDER — ZOLPIDEM TARTRATE 5 MG PO TABS: 5.0000 mg | ORAL_TABLET | Freq: Every evening | ORAL | Status: DC | PRN

## 2013-12-09 MED ORDER — ROSUVASTATIN CALCIUM 20 MG PO TABS: 20.0000 mg | ORAL_TABLET | Freq: Every day | ORAL | Status: DC

## 2013-12-09 NOTE — Progress Notes (Signed)
Patient ID: [name]    DOB: [birth year], 58 y.o.   MRN: [id number] --- Subjective:  [name] is a 58 y.o.female with h/o DM2, HTN, h/o TIA who presents for follow up on diabetes and HTN.  # Diabetes: - medications:  * novolog sliding scale: has not been able to get it through the [facility] for the last 2 weeks * lantus 6 units in the morning * metformin 1000 bid - missed doses in 1 week: lantus: no missed doses. novolog for the last 2 weeks.  - hypoglycemic events: none - CBG's at home: 137-140's in the morning fasting - frequency of monitoring: daily normally, but ran out of lancets in the last week and has not checked since.  - changes in vision? no - numbness or tingling in lower extremities? no - sores or ulcers? no - seen by podiatry? no - last eye check? 08/26/13 - diet: high salt, high sugar diet - exercise: walks 2-3 times per day when the weather permits  # Hypertension: Medications: hctz 12.54m daily, accupril 40 and norvasc 10 Number of doses missed in 1 week: 0 BP at home: doesn't check Exercise routine: walks 2-3 times per week Salt in diet: eats salt in diet Chest pain: none Lower extremity swelling: none Shortness of breath: none Headache: none Change in vision: none  # additionally, she was seen on Friday for evaluation of increased urinary frequency and dysuria. She had normal UA. She was treated for yeast. She states that it has improved.   ROS: see HPI Past Medical History: reviewed and updated medications and allergies. Social History: Tobacco: none  Objective: Filed Vitals:   12/09/13 0932  BP: 142/83  Pulse: 92  Temp: 98.4 F (36.9 C)    Physical Examination:   General appearance - alert, well appearing, and in no distress Chest - clear to auscultation, no wheezes, rales or rhonchi, symmetric air entry Heart - normal rate, regular rhythm, normal S1, S2, no murmurs Abdomen - soft, nontender, nondistended, no masses or organomegaly Extremities  - dorsalis pedis pulses present bilaterally, normal sensation to microfilament, no sores or ulcers, no edema   Last BMP and lipid panel:  08/23/13 Lipid Panel     Component Value Date/Time   CHOL 134 08/23/2013 0852   TRIG 65 08/23/2013 0852   HDL 54 08/23/2013 0852   CHOLHDL 2.5 08/23/2013 0852   VLDL 13 08/23/2013 0852   LDLCALC 67 08/23/2013 0852    CMP     Component Value Date/Time   NA 137 08/23/2013 0852   K 4.2 08/23/2013 0852   CL 98 08/23/2013 0852   CO2 30 08/23/2013 0852   GLUCOSE 128* 08/23/2013 0852   BUN 13 08/23/2013 0852   CREATININE 0.73 08/23/2013 0852   CREATININE 0.70 01/26/2011 2340   CALCIUM 10.1 08/23/2013 0852   PROT 7.3 08/23/2013 0852   ALBUMIN 4.2 08/23/2013 0852   AST 11 08/23/2013 0852   ALT <8 08/23/2013 0852   ALKPHOS 74 08/23/2013 0852   BILITOT 0.3 08/23/2013 0852   GFRNONAA >60 01/26/2011 2340   GFRAA  Value: >60        The eGFR has been calculated using the MDRD equation. This calculation has not been validated in all clinical situations. eGFR's persistently <60 mL/min signify possible Chronic Kidney Disease. 01/26/2011 2340

## 2013-12-09 NOTE — Patient Instructions (Signed)
Like we talked about, please increase the lantus from 6 units to 7 units.  For the blood pressure, increase the hydrochlorothiazide to 25mg  daily. When you start taking the new medication: olmesartan from the [facility] program, make an appointment with me 2-3 weeks after starting it.  Follow up with me in 1-2 months.

## 2013-12-10 NOTE — Assessment & Plan Note (Signed)
Not quite at goal for patient with DM.  - increase HCTZ to 25mg  daily - continue amlodipine 10 mg - continue accupril 40mg  daily for now. The [facility] program doesn't cover acupril anymore and asked for olmesartan to be written. She has not yet filled it since she still has accupril available. Patient instructed to return to clinic 2 weeks after starting the olmesartan for BP check and BMP with me.

## 2013-12-10 NOTE — Assessment & Plan Note (Signed)
A1C of 7.5 today which is improved from 8 in October, but still above goal.  She has been out of novolog which could be contributing - new Rx for novolog faxed to [facility] - increase lantus from 6 units to 7 units and monitor CBG's.  - red flags for hypoglycemia reviewed - repeat eye exam due in ocotber 2015

## 2013-12-10 NOTE — Assessment & Plan Note (Signed)
Refilled ambien

## 2014-01-03 ENCOUNTER — Telehealth: Payer: Self-pay | Admitting: Family Medicine

## 2014-01-03 NOTE — Telephone Encounter (Signed)
Called [place].  She states patient's mother has spoken to a nurse already.  Mikalia Fessel, Darlyne RussianKristen L, CMA

## 2014-01-03 NOTE — Telephone Encounter (Signed)
Pt called and would like the nurse to call about her grand baby who is 57 year old. The baby is constipated and would like to know what she caqn do to help him. Myriam Jacobsonjw

## 2014-01-10 ENCOUNTER — Other Ambulatory Visit: Payer: Self-pay | Admitting: Family Medicine

## 2014-01-22 ENCOUNTER — Encounter: Payer: Self-pay | Admitting: *Deleted

## 2014-01-23 ENCOUNTER — Other Ambulatory Visit: Payer: Self-pay | Admitting: Family Medicine

## 2014-01-23 DIAGNOSIS — E119 Type 2 diabetes mellitus without complications: Secondary | ICD-10-CM

## 2014-01-23 MED ORDER — INSULIN GLARGINE 100 UNIT/ML ~~LOC~~ SOLN: 7.0000 [IU] | Freq: Every day | SUBCUTANEOUS | Status: DC

## 2014-01-24 ENCOUNTER — Other Ambulatory Visit: Payer: Self-pay | Admitting: Family Medicine

## 2014-01-24 DIAGNOSIS — Z1231 Encounter for screening mammogram for malignant neoplasm of breast: Secondary | ICD-10-CM

## 2014-01-30 ENCOUNTER — Ambulatory Visit (HOSPITAL_COMMUNITY): Payer: No Typology Code available for payment source

## 2014-02-05 ENCOUNTER — Telehealth: Payer: Self-pay | Admitting: Family Medicine

## 2014-02-05 MED ORDER — OLMESARTAN MEDOXOMIL 20 MG PO TABS: 20.0000 mg | ORAL_TABLET | Freq: Every day | ORAL | Status: DC

## 2014-02-05 NOTE — Telephone Encounter (Signed)
Will fwd to PCP to address. Thanks. Lorenda Hatchet.Antrell Tipler, Renato Battleshekla

## 2014-02-05 NOTE — Telephone Encounter (Signed)
Pt called because she has never received her BP medication Olmesartan she was waiting for the [facility] program to call her tell her that it is ready. [facility]. She can we send it to the [facility] program today so she can start her medication. jw

## 2014-02-05 NOTE — Telephone Encounter (Signed)
Pt notified.  Rita Prom L, CMA  

## 2014-02-05 NOTE — Telephone Encounter (Signed)
Printed Rx and Rx to be faxed today to [facility].  Please let patient know.  Marena ChancyStephanie Elma Shands, PGY-3 Family Medicine Resident

## 2014-02-06 ENCOUNTER — Ambulatory Visit: Payer: No Typology Code available for payment source | Admitting: Family Medicine

## 2014-02-07 ENCOUNTER — Ambulatory Visit (HOSPITAL_COMMUNITY): Payer: No Typology Code available for payment source

## 2014-02-18 ENCOUNTER — Other Ambulatory Visit: Payer: Self-pay | Admitting: Family Medicine

## 2014-02-18 ENCOUNTER — Telehealth: Payer: Self-pay | Admitting: *Deleted

## 2014-02-18 MED ORDER — METFORMIN HCL 1000 MG PO TABS: ORAL_TABLET | ORAL | Status: DC

## 2014-02-18 NOTE — Telephone Encounter (Signed)
Received refill request Metformin HCL 1000 mg tab, but there are two listed in medication list.  Not sure which one to reorder.  Clovis PuMartin, Shevonne Wolf L, RN

## 2014-02-18 NOTE — Telephone Encounter (Signed)
Refilled metformin 1000mg  bid and [facility] to [facility].  Marena ChancyStephanie Dazja Houchin, PGY-3 Family Medicine Resident

## 2014-02-20 ENCOUNTER — Ambulatory Visit (HOSPITAL_COMMUNITY)
Admission: RE | Admit: 2014-02-20 | Discharge: 2014-02-20 | Disposition: A | Discharge status: Home / Self Care | Payer: No Typology Code available for payment source | Source: Ambulatory Visit | Attending: Family Medicine | Admitting: Family Medicine

## 2014-02-20 DIAGNOSIS — Z1231 Encounter for screening mammogram for malignant neoplasm of breast: Secondary | ICD-10-CM

## 2014-02-27 ENCOUNTER — Telehealth: Payer: Self-pay | Admitting: Family Medicine

## 2014-02-27 NOTE — Telephone Encounter (Signed)
Pt called because she is taking Benicar and she is having very bad sweating side effects. She is not going to take this until she talks to Dr. Gwenlyn SaranLosq. Myriam Jacobsonjw

## 2014-02-27 NOTE — Telephone Encounter (Signed)
Will fwd to Dr.Losq for review (pt has upcoming appt 03/05/14) .[name]

## 2014-02-27 NOTE — Telephone Encounter (Signed)
Called patient back. She tells me that she had a bad hot flash with significant sweating last Monday. She had started taking benicar last Friday and got scared that the hot flash was associated with the medicine. She had also heard on TV side effects from the benicar including kidney failure, which scared her.  She did not check her sugar when she had the hot flash. I instructed her to check it next time she feels this way, as it can be a sign of low sugar. I told her that I did not think that the benicar caused this, but that if it happens again, we will revisit things.  I told her that we monitor kidney function carefully when benicar is started and that we will check a bmp when she is seen next Wednesday. She has been on benicar since last Friday except for yesterday and today. Instructed her to start it back and we will check labs on Wednesday.  She expressed understanding and agreed with plan.   Marena ChancyStephanie Raylei Losurdo, PGY-3 Family Medicine Resident

## 2014-03-05 ENCOUNTER — Ambulatory Visit (INDEPENDENT_AMBULATORY_CARE_PROVIDER_SITE_OTHER): Payer: No Typology Code available for payment source | Admitting: Family Medicine

## 2014-03-05 ENCOUNTER — Encounter: Payer: Self-pay | Admitting: Family Medicine

## 2014-03-05 VITALS — BP 131/68 | HR 100 | Ht 64.0 in | Wt 153.2 lb

## 2014-03-05 DIAGNOSIS — E119 Type 2 diabetes mellitus without complications: Secondary | ICD-10-CM

## 2014-03-05 DIAGNOSIS — G4700 Insomnia, unspecified: Secondary | ICD-10-CM

## 2014-03-05 DIAGNOSIS — E785 Hyperlipidemia, unspecified: Secondary | ICD-10-CM

## 2014-03-05 DIAGNOSIS — I10 Essential (primary) hypertension: Secondary | ICD-10-CM

## 2014-03-05 LAB — LIPID PANEL
Cholesterol: 131 mg/dL (ref 0–200)
HDL: 60 mg/dL (ref >39)
LDL Cholesterol: 56 mg/dL (ref 0–99)
TRIGLYCERIDES: 73 mg/dL (ref <150)
Total CHOL/HDL Ratio: 2.2 Ratio
VLDL: 15 mg/dL (ref 0–40)

## 2014-03-05 LAB — COMPREHENSIVE METABOLIC PANEL
ALT: <8 U/L (ref 0–35)
AST: 15 U/L (ref 0–37)
Albumin: 4.3 g/dL (ref 3.5–5.2)
Alkaline Phosphatase: 72 U/L (ref 39–117)
BILIRUBIN TOTAL: 0.3 mg/dL (ref 0.2–1.2)
BUN: 14 mg/dL (ref 6–23)
CO2: 28 mEq/L (ref 19–32)
CREATININE: 0.77 mg/dL (ref 0.50–1.10)
Calcium: 9.7 mg/dL (ref 8.4–10.5)
Chloride: 97 mEq/L (ref 96–112)
GLUCOSE: 104 mg/dL — AB (ref 70–99)
Potassium: 3.6 mEq/L (ref 3.5–5.3)
SODIUM: 137 meq/L (ref 135–145)
Total Protein: 7.3 g/dL (ref 6.0–8.3)

## 2014-03-05 LAB — POCT GLYCOSYLATED HEMOGLOBIN (HGB A1C): Hemoglobin A1C: 7.7

## 2014-03-05 MED ORDER — LISINOPRIL 10 MG PO TABS: 10.0000 mg | ORAL_TABLET | Freq: Every day | ORAL | Status: DC

## 2014-03-05 MED ORDER — ZOLPIDEM TARTRATE 5 MG PO TABS: 5.0000 mg | ORAL_TABLET | Freq: Every evening | ORAL | Status: DC | PRN

## 2014-03-05 NOTE — Assessment & Plan Note (Signed)
Rechecking lipid panel (non fasting) and CMP since on increased dose of crestor for 6 months.

## 2014-03-05 NOTE — Assessment & Plan Note (Signed)
At goal today on amlodipine and hctz. Stopped taking benicar a couple of days ago because she is afraid of side effects that she saw on TV add.  - will stop benicar and start her on lisinopril 10mg  daily.  - get BMP to check Cr and K since she was on benicar for about 10 days.  - follow up in 2 weeks for repeat BP and BMP since started lisinopril.

## 2014-03-05 NOTE — Assessment & Plan Note (Signed)
ambien 5mg  refilled

## 2014-03-05 NOTE — Patient Instructions (Signed)
Please increase your novolog at night to 4 units. Continue novolog in am and noon at 2units and lantus in am at 7units.   Please log your sugars on a piece of paper for morning, noon and night and bring it with you at your next visit.   Follow up with me in 2 weeks.

## 2014-03-05 NOTE — Progress Notes (Signed)
Patient ID: Thelma CompRachel M Lamadrid    DOB: 02-15-1956, 58 y.o.   MRN: [id number] --- Subjective:  [name] is a 58 y.o.female who presents for follow up on HTN and DM  # Hypertension: Medications: amlodipine 10mg , hctz 25mg  daily, stopped taking benicar 2 days ago because got scared from TV add and is afraid to take it Number of doses missed in 1 week: 0 BP at home: no Exercise routine: [place] [facility] [employer] 2-3 days per week, 1hr away Salt in diet: uses Mrs dash Chest pain: none Lower extremity swelling: none Shortness of breath: none Headache: none Change in vision: none  # Diabetes: - medications: metformin 1000mg  twice a day, lantus 7 units in the am, novolog 2units with meals.  Meals: 3 meals and a snack - missed doses in 1 week: 0 - hypoglycemic events: 68 at night felt weak and drank juice which helped.  - CBG's at home:  AM: 90 fasting Highest: 201 in the afternoon fasting - frequency of monitoring: am, noon, night - changes in vision? none - numbness or tingling in lower extremities? none - sores or ulcers? none - seen by podiatry? no - last eye check? Ocotber 2014  ROS: see HPI Past Medical History: reviewed and updated medications and allergies. Social History: Tobacco: none  Objective: Filed Vitals:   03/05/14 1020  BP: 131/68  Pulse: 100    Physical Examination:   General appearance - alert, well appearing, and in no distress Nose - normal and patent, no erythema, discharge or polyps Mouth - mucous membranes moist, pharynx normal without lesions Neck - supple, no significant adenopathy Chest - clear to auscultation, no wheezes, rales or rhonchi, symmetric air entry Heart - normal rate, regular rhythm, normal S1, S2, 1/6 systolic murmur best heard at the left upper sternal border Extremities - dorsalis pedis pulses normal bilaterally, normal sensation to light touch, normal microfilament testing, no calluses or sores

## 2014-03-05 NOTE — Assessment & Plan Note (Addendum)
A1C of 7.7 increased from last time.  - increase novolog at night to 4units.  - Continue novolog in am and noon at 2units and lantus in am at 7units.  - log  sugars  for morning, noon and night and bring it with you at your next visit.  - follow up in 2 weeks - foot exam today and normal

## 2014-03-10 ENCOUNTER — Encounter: Payer: Self-pay | Admitting: Family Medicine

## 2014-03-18 ENCOUNTER — Ambulatory Visit (INDEPENDENT_AMBULATORY_CARE_PROVIDER_SITE_OTHER): Payer: No Typology Code available for payment source | Admitting: Family Medicine

## 2014-03-18 ENCOUNTER — Encounter: Payer: Self-pay | Admitting: Family Medicine

## 2014-03-18 VITALS — BP 110/68 | HR 84 | Temp 98.3°F | Ht 64.0 in | Wt 151.0 lb

## 2014-03-18 DIAGNOSIS — I10 Essential (primary) hypertension: Secondary | ICD-10-CM

## 2014-03-18 DIAGNOSIS — E119 Type 2 diabetes mellitus without complications: Secondary | ICD-10-CM

## 2014-03-18 MED ORDER — INSULIN ASPART 100 UNIT/ML ~~LOC~~ SOLN: 2.0000 [IU] | Freq: Three times a day (TID) | SUBCUTANEOUS | Status: DC

## 2014-03-18 MED ORDER — INSULIN GLARGINE 100 UNIT/ML SOLOSTAR PEN: 7.0000 [IU] | PEN_INJECTOR | Freq: Every day | SUBCUTANEOUS | Status: DC

## 2014-03-18 NOTE — Progress Notes (Signed)
Patient ID: [name]    DOB: 1956-09-18, [age] y.o.   MRN: [id number] --- Subjective:  [name] is a 58 y.o.female who presents for follow up on DM2 and HTN.  - DM: her insulin expired on 03/11/14 and she did not know she needed to be recertified through the [facility]. She has since recertified and is awaiting medicine. She has not been taking her insulin like she normally does. Taking it maybe every other day.  CBG's have been in the 140's to 200's. This morning was 140. No higher than 300.   - HTN: she was started on lisinopril 2 weeks ago and has been taking it without side effects. She doesn't check her BP at home, but denies any CP, shortness of breath, edema. She takes her BP meds every day.    ROS: see HPI Past Medical History: reviewed and updated medications and allergies. Social History: Tobacco: none  Objective: Filed Vitals:   03/18/14 1447  BP: 110/68  Pulse: 84  Temp: 98.3 F (36.8 C)    Physical Examination:   General appearance - alert, well appearing, and in no distress Mouth - mucous membranes moist, pharynx normal without lesions Neck - supple, no significant adenopathy Chest - clear to auscultation, no wheezes, rales or rhonchi, symmetric air entry Heart - normal rate, regular rhythm, normal S1, S2, no murmurs Extremities - no edema

## 2014-03-18 NOTE — Assessment & Plan Note (Signed)
No insulin for several days, awaiting recertification through MAP. No evidence of uncontrolled hyperglycemia based on home CBG's.  Still taking metformin.  Gave patient samples of lantus and novolog: one vial of lantus and one vial of novolog.  Follow up in 1 month for check up on glucose at home.

## 2014-03-18 NOTE — Assessment & Plan Note (Signed)
Well controled with hctz and newly added lisinopril 10mg .  Check BMP

## 2014-03-18 NOTE — Patient Instructions (Signed)
Follow up in 1 month after you have started back on your insulin to see how your sugars are doing.

## 2014-03-19 LAB — BASIC METABOLIC PANEL
BUN: 16 mg/dL (ref 6–23)
CO2: 24 meq/L (ref 19–32)
Calcium: 9.9 mg/dL (ref 8.4–10.5)
Chloride: 95 mEq/L — ABNORMAL LOW (ref 96–112)
Creat: 0.91 mg/dL (ref 0.50–1.10)
GLUCOSE: 271 mg/dL — AB (ref 70–99)
POTASSIUM: 4.1 meq/L (ref 3.5–5.3)
Sodium: 134 mEq/L — ABNORMAL LOW (ref 135–145)

## 2014-03-21 ENCOUNTER — Telehealth: Payer: Self-pay | Admitting: Family Medicine

## 2014-03-21 NOTE — Telephone Encounter (Signed)
Pt notified.  [name] L Mattisyn Cardona, CMA  

## 2014-03-21 NOTE — Telephone Encounter (Signed)
Please let patient know that her kidney function was normal and that she can continue taking the lisinopril.   Thank you!  Marena ChancyStephanie Endrit Gittins, PGY-3 Family Medicine Resident

## 2014-04-18 ENCOUNTER — Ambulatory Visit: Payer: No Typology Code available for payment source

## 2014-04-18 ENCOUNTER — Encounter: Payer: Self-pay | Admitting: Family Medicine

## 2014-04-18 ENCOUNTER — Ambulatory Visit (INDEPENDENT_AMBULATORY_CARE_PROVIDER_SITE_OTHER): Payer: No Typology Code available for payment source | Admitting: Family Medicine

## 2014-04-18 VITALS — BP 116/78 | HR 76 | Temp 98.1°F | Wt 152.0 lb

## 2014-04-18 DIAGNOSIS — M25569 Pain in unspecified knee: Secondary | ICD-10-CM

## 2014-04-18 DIAGNOSIS — M25561 Pain in right knee: Secondary | ICD-10-CM

## 2014-04-18 DIAGNOSIS — E119 Type 2 diabetes mellitus without complications: Secondary | ICD-10-CM

## 2014-04-18 MED ORDER — MELOXICAM 15 MG PO TABS: 15.0000 mg | ORAL_TABLET | Freq: Every day | ORAL | Status: DC

## 2014-04-18 NOTE — Progress Notes (Signed)
Patient ID: DESMARIE NANDIN    DOB: [birth year], 58 y.o.   MRN: [id number] --- Subjective:  Elanore is a 58 y.o.female with h/o HTN, DM2 who presents for follow up on DM2.  - DM2: she was able to get insulin filled at the [facility]. She is currently taking lantus 7units in am and novolog 2units at breakfast and lunch and 4 units at dinner. Review of her CBG's shows: fasting in am: 80-110 and postprandial at night time: average: 112-163 with an outlier of 316 which when rechecked without insulin was 99. She had eaten a large meal that night. She denies any hypoglycemic symptoms. She is compliant with the medicine. She denies any lower ext tingling or numbness.   - right knee pain: she fell on her right knee 3 days ago trying to catch her grandson who was about to fall from the couch. She states that she fell straight on her knee. Did not twist it. Did not hear pop and did not experience any swelling. She has rubbed it with alcohol. Pain has improved. Located on the anterior aspect of the knee and sometimes at the back of the knee when she extends it for too long  ROS: see HPI Past Medical History: reviewed and updated medications and allergies. Social History: Tobacco: former tobacco snuff user  Objective: Filed Vitals:   04/18/14 1210  BP: 116/78  Pulse: 76  Temp: 98.1 F (36.7 C)    Physical Examination:   General appearance - alert, well appearing, and in no distress Chest - clear to auscultation, no wheezes, rales or rhonchi, symmetric air entry Heart - normal rate, regular rhythm, normal S1, S2, no murmurs Right knee - no tenderness to palpation, no soft tissue swelling or joint effusion, normal extension and flexion of the knee, negative McMurray's. Negative Lachman's.

## 2014-04-18 NOTE — Assessment & Plan Note (Signed)
Apart for one outlier, CBG's are within range.  - continue lantus 7units and novolog 2u at bkfst and lunch and 4u at dinner. If big meal, patient to increase meal coverage by one unit - follow up in 3 months for A1C

## 2014-04-18 NOTE — Patient Instructions (Signed)
For the knee pain, take the meloxicam with food once a day as needed for the pain.  Ice the knee and elevate it. If not better in 2 weeks, come back so that we can take another look.   For the diabetes, keep everything like it is. Increase by 1 unit of novolog at night when you eat a big meal. Follow up in 3 months.

## 2014-04-18 NOTE — Assessment & Plan Note (Signed)
S/p fall. No point tenderness warranting xray at this time. Symptoms improving. No evidence of ligament or meniscal injury.  Will treat with mobic and ice and elevation. If not better in a couple of weeks, consider further evaluation

## 2014-05-14 ENCOUNTER — Telehealth: Payer: Self-pay | Admitting: Family Medicine

## 2014-05-14 DIAGNOSIS — G4700 Insomnia, unspecified: Secondary | ICD-10-CM

## 2014-05-14 MED ORDER — ZOLPIDEM TARTRATE 5 MG PO TABS: 5.0000 mg | ORAL_TABLET | Freq: Every evening | ORAL | Status: DC | PRN

## 2014-05-14 NOTE — Telephone Encounter (Signed)
Need refill on her ambien.

## 2014-05-14 NOTE — Telephone Encounter (Signed)
Rx printed and left at front desk (this medicine can't be sent electronically). Please let her know she can pick it up at her convenience.  Pt can follow up with me as needed. Thanks! --CMS  Note to providers: Reviewed chart; pt has been on Ambien and stable with it for insomnia for over 1 year, so I am comfortable refilling it at this time

## 2014-05-15 NOTE — Telephone Encounter (Signed)
Patient informed, expressed understanding. 

## 2014-06-10 ENCOUNTER — Telehealth: Payer: Self-pay | Admitting: Family Medicine

## 2014-06-10 NOTE — Telephone Encounter (Signed)
Pt called and needs a referral to the dental clinic that takes the orange card. jw

## 2014-06-12 NOTE — Telephone Encounter (Signed)
Called patient and told her she will need to be evaluated here before we can refer her to the dental clinic. Dr Casper HarrisonStreet is unavailable until the end of August so I told patient to call the front desk and get an appointment for Monday on the work in schedule.Busick, Rodena Medinobert Lee

## 2014-06-13 ENCOUNTER — Other Ambulatory Visit: Payer: Self-pay | Admitting: *Deleted

## 2014-06-13 MED ORDER — HYDROCHLOROTHIAZIDE 25 MG PO TABS: 12.5000 mg | ORAL_TABLET | Freq: Every day | ORAL | Status: DC

## 2014-06-16 ENCOUNTER — Ambulatory Visit (INDEPENDENT_AMBULATORY_CARE_PROVIDER_SITE_OTHER): Payer: No Typology Code available for payment source | Admitting: Family Medicine

## 2014-06-16 ENCOUNTER — Encounter: Payer: Self-pay | Admitting: Family Medicine

## 2014-06-16 ENCOUNTER — Telehealth: Payer: Self-pay | Admitting: Family Medicine

## 2014-06-16 VITALS — BP 123/80 | HR 96 | Temp 98.7°F | Ht 64.0 in | Wt 146.4 lb

## 2014-06-16 DIAGNOSIS — K0520 Aggressive periodontitis, unspecified: Secondary | ICD-10-CM | POA: Insufficient documentation

## 2014-06-16 MED ORDER — AMOXICILLIN-POT CLAVULANATE 875-125 MG PO TABS: 1.0000 | ORAL_TABLET | Freq: Two times a day (BID) | ORAL | Status: DC

## 2014-06-16 MED ORDER — HYDROCODONE-ACETAMINOPHEN 5-325 MG PO TABS: 1.0000 | ORAL_TABLET | Freq: Four times a day (QID) | ORAL | Status: DC | PRN

## 2014-06-16 NOTE — Patient Instructions (Addendum)
Dear Lucie Leatherachel Mustin, Thank you for coming in to clinic today. It was good to see you!  Today we discussed your Tooth Pain. 1. It looks like you have several infected teeth 2. Sent prescription for antibiotics - Augmentin take 1 tablet twice a day (morning and night) for total 10 days. This will help slow down infection but won't treat it completely 3. Printed prescription for Norco (Hydrocodone/Acetaminophen) for pain, take 1-2 tablets as needed every 6 hours. You may continue to take Aleve (try to only take 500mg  twice daily, every 12 hours as instructed). 4. Sent referral for Dentist clinic - it sounds like you will need some tooth extractions. If you do not hear back within 2-3 weeks, I would go ahead and go to the free Dental Clinic to have tooth extractions and follow-up with dentist afterwards  Please schedule a follow-up appointment with Dr. Casper HarrisonStreet in 1-2 months as needed for follow-up.  If you have any other questions or concerns, please feel free to call the clinic to contact me. You may also schedule an earlier appointment if necessary.  However, if your symptoms get significantly worse, please go to the Emergency Department to seek immediate medical attention.  Saralyn PilarAlexander Antonia Jicha, DO [facility]

## 2014-06-16 NOTE — Progress Notes (Signed)
Subjective:     Patient ID: [name], female   DOB: [birth year], 57 y.o.   MRN: [id number]  Patient presented for same day appointment.  HPI  Toothache: - Reports she has 2 loose teeth on top and 1 on bottom, with significant pain and swollen irritated gums worsening over past 1 week. Previously the tooth on bottom was loose for several months without irritation. - Currently with difficulty eating solid food due to pain with chewing - Taking Aleve for pain without significant relief, 6x daily, tried Tylenol and Ibuprofen without success - Tried to schedule apt with Dentist was told it would be 4 months, came in to get in sooner - Reports that [facility] of August 2015 that she may go to for teeth extractions (has been to one in the past) - Denies any fevers/chills, n/v, pus or bleeding from mouth  I have reviewed and updated the following as appropriate: allergies and current medications  Social Hx: Never smoker  Review of Systems  See above HPI    Objective:   Physical Exam  BP 123/80  Pulse 96  Temp(Src) 98.7 F (37.1 C) (Oral)  Ht 5\' 4"  (1.626 m)  Wt 146 lb 6.4 oz (66.407 kg)  BMI 25.12 kg/m2  Gen - well-appearing, some difficulty talking due to tooth pain NAD Head - facial tenderness left maxillary area above teeth Mouth - overall chronically poor dentition with vast majority of teeth already s/p extraction, two top canine teeth with visible decay, surround inflamed erythematous edematous gums, teeth loose and tender to touch, no drainage or abscess. One lower mid-right incisor similarly loose with decay and gum inflammation Neck - supple, non-tender, no LAD Heart - mild tachycardia, regular rhythm, no murmurs Skin - warm, dry Neuro - awake, alert, oriented     Assessment:     See specific A&P problem list for details.      Plan:     See specific A&P problem list for details.

## 2014-06-16 NOTE — Assessment & Plan Note (Signed)
Multiple teeth consistent with acute infection in setting of chronic dental disease with hx multiple prior extractions and generalized gingivitis - Localized acute infection upper 2 left canines with gum erythema / edema - Afebrile, no systemic symptoms of infection  Plan: 1. Augmentin BID x 10 days to prevent worsening / spread of dental infection 2. Norco 5/325 PRN pain 3. Dental referral, anticipate extraction. Note patient reports planning to go to free dental clinic end of August for likely extractions, advised follow-up with dentist 4. RTC precautions

## 2014-06-16 NOTE — Telephone Encounter (Signed)
[facility], left message on patient home voicemail.

## 2014-06-16 NOTE — Telephone Encounter (Signed)
Pt called and would like the [facility] called in to [facility] because [employer] [facility] is to expensive . jw

## 2014-07-03 ENCOUNTER — Other Ambulatory Visit: Payer: Self-pay | Admitting: *Deleted

## 2014-07-03 MED ORDER — LISINOPRIL 10 MG PO TABS: 10.0000 mg | ORAL_TABLET | Freq: Every day | ORAL | Status: DC

## 2014-07-23 ENCOUNTER — Other Ambulatory Visit: Payer: Self-pay | Admitting: *Deleted

## 2014-07-23 MED ORDER — METFORMIN HCL 1000 MG PO TABS: ORAL_TABLET | ORAL | Status: DC

## 2014-07-24 ENCOUNTER — Telehealth: Payer: Self-pay | Admitting: Family Medicine

## 2014-07-24 DIAGNOSIS — G4700 Insomnia, unspecified: Secondary | ICD-10-CM

## 2014-07-24 NOTE — Telephone Encounter (Signed)
Needs refills on sleeping pills. Has appt on Wed

## 2014-07-25 MED ORDER — ZOLPIDEM TARTRATE 5 MG PO TABS: 5.0000 mg | ORAL_TABLET | Freq: Every evening | ORAL | Status: DC | PRN

## 2014-07-25 NOTE — Telephone Encounter (Signed)
Patient informed. 

## 2014-07-25 NOTE — Addendum Note (Signed)
Addended by: Bobbye Morton on: 07/25/2014 10:44 AM   Modules accepted: Orders

## 2014-07-25 NOTE — Telephone Encounter (Signed)
Will leave Rx at front desk. Please call her to let her know. Thanks! --CMS

## 2014-07-30 ENCOUNTER — Encounter: Payer: Self-pay | Admitting: Family Medicine

## 2014-07-30 ENCOUNTER — Ambulatory Visit (INDEPENDENT_AMBULATORY_CARE_PROVIDER_SITE_OTHER): Payer: No Typology Code available for payment source | Admitting: Family Medicine

## 2014-07-30 VITALS — BP 113/70 | HR 97 | Temp 98.0°F | Ht 64.0 in | Wt 144.0 lb

## 2014-07-30 DIAGNOSIS — I10 Essential (primary) hypertension: Secondary | ICD-10-CM

## 2014-07-30 DIAGNOSIS — E785 Hyperlipidemia, unspecified: Secondary | ICD-10-CM

## 2014-07-30 DIAGNOSIS — E119 Type 2 diabetes mellitus without complications: Secondary | ICD-10-CM

## 2014-07-30 DIAGNOSIS — Z23 Encounter for immunization: Secondary | ICD-10-CM

## 2014-07-30 DIAGNOSIS — M542 Cervicalgia: Secondary | ICD-10-CM

## 2014-07-30 LAB — POCT GLYCOSYLATED HEMOGLOBIN (HGB A1C): Hemoglobin A1C: 7.3

## 2014-07-30 MED ORDER — TRAMADOL HCL 50 MG PO TABS: 25.0000 mg | ORAL_TABLET | Freq: Three times a day (TID) | ORAL | Status: DC | PRN

## 2014-07-30 NOTE — Assessment & Plan Note (Signed)
A: Compliant with Crestor without frank side-effects.  P: Continue Crestor 20 mg daily. Likely repeat CMP and lipid panel in about 3 months.

## 2014-07-30 NOTE — Progress Notes (Signed)
   Subjective:    Patient ID: [name], female    DOB: [birth year], 57 y.o.   MRN: [id number]  HPI: Pt presents to clinic for f/u on DM, HTN, and complaints of neck pain. She also has a "spot" on the bottom of one foot.  DM - reports compliance with Lantus 7 units daily, Novolog three times a day with meals (2-4 units) - also reports compliance with metformin 1000 mg twice a day - denies numbness / tingling in feet - does have spot on the bottom of her left foot; described as a round itching spot, not bleeding or draining - she sees ophthalmologist yearly  HTN - reports compliance with Norvasc 5 mg, lisinopril 10 mg, and HCTZ 12.5 mg - denies chest pain, SOB, swelling in feet, or change in vision - states that at one point she had been changed to 10 mg of Norvasc and 25 mg of HCTZ, but was told to take half a tablet of each, more recently  HLD - reports compliance with Crestor - denies myalgias, new / different GI complaints  Neck pain - intermittent for years, worse for the past 2 weeks - sharp in back of neck, runs down into jawbone, similar to a toothache - aching / soreness type of pain, worse with movement - denies fevers / chills, numbness or weakness in hands or arms, or rashes  Of note, pt has very poor dentition and is awaiting a dentist appointment.  Review of Systems: As above. Otherwise, full 12-system ROS was reviewed and all negative.      Objective:   Physical Exam BP 113/70  Pulse 97  Temp(Src) 98 F (36.7 C) (Oral)  Ht  (1.626 m)  Wt 144 lb (65.318 kg)  BMI 24.71 kg/m2 Gen: well-appearing adult female in NAD HEENT: [place]/AT, EOMI, PERRLA  Neck supple without frank muscle spasm  Mild tenderness with movements (active / passive) but no frank muscle tenderness to point palpation along trapezius Cardio: RRR, no murmur appreciated Pulm: CTAB, no wheezes Abd: soft, nontender, BS+ Ext: warm, well-perfused Neuro: no gross focal deficits noted; strength  5/5 throughout  Foot exam: see diabetic foot exam form Small spot of discolored skin to left plantar foot, nontender / nonfluctuant Bilateral pulses intact / symmetric No skin breakdown noted, sensation grossly intact, minimal callous formation to heels bilaterally      Assessment & Plan:

## 2014-07-30 NOTE — Assessment & Plan Note (Signed)
BP seems well-controled on lisinopril 10 mg, amlodipine 5 mg, HCTZ 12.5 mg. Continue current meds plus statin (Crestor 20 mg) and ASA. Monitor routinely at f/u.

## 2014-07-30 NOTE — Patient Instructions (Signed)
Thank you for coming in, today!  Everything looks okay, today. Keep taking your medicines without any changes. Keep taking HALF of one pill for HCTZ and amlodipine for now. Don't change any other medicines.  For your neck pain, you can take one half to one whole tablet of tramadol (25 or 50 mg) up to three times per day as needed. If it's getting worse or not getting better, come back to see me about it specifically.  Otherwise, come back to see me in about 3 months. We'll repeat some blood work at that time. Please feel free to call with any questions or concerns at any time, at [phone]. --Dr. Casper Harrison

## 2014-07-30 NOTE — Assessment & Plan Note (Signed)
MSK-type neck pain without frank neuro symptoms and no significant muscle tenderness on exam. Rx for tramadol PRN. F/u as needed; return to clinic if continues or worsens, and would consider plain films at that time, and/or referral to sports medicine or orthopedics.

## 2014-07-30 NOTE — Assessment & Plan Note (Signed)
A: A1c 7.3 today, and CBG's generally 80's-130's. Reports compliance with metformin, Lantus, and Novolog. Denies frank hypoglycemic symptoms.  P: Continue current meds / doses (Lantus 7 units daily, Novolog 2-4 units TID). Repeat A1c in about 3 months. Likely recheck CMP at that time, as well. F/u as needed otherwise.

## 2014-08-11 ENCOUNTER — Other Ambulatory Visit: Payer: Self-pay | Admitting: *Deleted

## 2014-08-11 MED ORDER — OLMESARTAN MEDOXOMIL 20 MG PO TABS: 20.0000 mg | ORAL_TABLET | Freq: Every day | ORAL | Status: DC

## 2014-08-22 ENCOUNTER — Other Ambulatory Visit: Payer: Self-pay | Admitting: *Deleted

## 2014-08-22 MED ORDER — INSULIN ASPART 100 UNIT/ML ~~LOC~~ SOLN: 2.0000 [IU] | Freq: Three times a day (TID) | SUBCUTANEOUS | Status: DC

## 2014-08-22 NOTE — Telephone Encounter (Signed)
Rx printed to be sent to [facility]. Thanks. --CMS

## 2014-09-29 ENCOUNTER — Other Ambulatory Visit: Payer: Self-pay | Admitting: Family Medicine

## 2014-09-29 DIAGNOSIS — G4700 Insomnia, unspecified: Secondary | ICD-10-CM

## 2014-09-29 MED ORDER — ZOLPIDEM TARTRATE 5 MG PO TABS: 5.0000 mg | ORAL_TABLET | Freq: Every evening | ORAL | Status: DC | PRN

## 2014-09-29 MED ORDER — OLMESARTAN MEDOXOMIL 20 MG PO TABS: 20.0000 mg | ORAL_TABLET | Freq: Every day | ORAL | Status: DC

## 2014-09-29 NOTE — Telephone Encounter (Signed)
Rx for Benicar to be faxed to [facility] ([facility]). Will leave Ambien Rx at front desk. Please call pt to let her know she can pick it up at her convenience. Thanks! --CMS

## 2014-09-29 NOTE — Telephone Encounter (Signed)
Needs refill on Ambien  

## 2014-09-29 NOTE — Telephone Encounter (Signed)
Patient informed. 

## 2014-09-29 NOTE — Addendum Note (Signed)
Addended by: Bobbye MortonSTREET, Diamonte Stavely M on: 09/29/2014 12:43 PM   Modules accepted: Orders

## 2014-09-30 ENCOUNTER — Other Ambulatory Visit: Payer: Self-pay | Admitting: *Deleted

## 2014-09-30 MED ORDER — INSULIN GLARGINE 100 UNIT/ML ~~LOC~~ SOLN: 7.0000 [IU] | Freq: Every day | SUBCUTANEOUS | Status: DC

## 2014-09-30 NOTE — Telephone Encounter (Signed)
Left voice message on pharmacy line with [facility] for pt's Lantus 100 Units per mL: inject 7 unites daily; disp 10 ML, 11 refills. Clovis PuMartin, Tamika L, RN Unable to leave voicemail for pt.  Clovis PuMartin, Tamika L, RN

## 2014-09-30 NOTE — Telephone Encounter (Signed)
Please call Rx in to [facility], Lantus 100 units per mL, inject 7 units daily, disp 10 mL, refills 11, and let pt know. Thanks. --CMS

## 2014-10-23 ENCOUNTER — Ambulatory Visit: Payer: Self-pay

## 2014-11-25 ENCOUNTER — Ambulatory Visit (INDEPENDENT_AMBULATORY_CARE_PROVIDER_SITE_OTHER): Payer: Self-pay | Admitting: Family Medicine

## 2014-11-25 ENCOUNTER — Encounter: Payer: Self-pay | Admitting: Family Medicine

## 2014-11-25 VITALS — BP 143/79 | HR 116 | Temp 98.6°F | Ht 64.0 in | Wt 138.0 lb

## 2014-11-25 DIAGNOSIS — R634 Abnormal weight loss: Secondary | ICD-10-CM | POA: Insufficient documentation

## 2014-11-25 DIAGNOSIS — E1169 Type 2 diabetes mellitus with other specified complication: Secondary | ICD-10-CM

## 2014-11-25 DIAGNOSIS — E119 Type 2 diabetes mellitus without complications: Secondary | ICD-10-CM

## 2014-11-25 DIAGNOSIS — I10 Essential (primary) hypertension: Secondary | ICD-10-CM

## 2014-11-25 DIAGNOSIS — E785 Hyperlipidemia, unspecified: Secondary | ICD-10-CM

## 2014-11-25 DIAGNOSIS — D509 Iron deficiency anemia, unspecified: Secondary | ICD-10-CM

## 2014-11-25 LAB — CBC WITH DIFFERENTIAL/PLATELET
BASOS PCT: 0 % (ref 0–1)
Basophils Absolute: 0 10*3/uL (ref 0.0–0.1)
Eosinophils Absolute: 0.1 10*3/uL (ref 0.0–0.7)
Eosinophils Relative: 1 % (ref 0–5)
HCT: 35.2 % — ABNORMAL LOW (ref 36.0–46.0)
Hemoglobin: 11.1 g/dL — ABNORMAL LOW (ref 12.0–15.0)
LYMPHS PCT: 19 % (ref 12–46)
Lymphs Abs: 1.2 10*3/uL (ref 0.7–4.0)
MCH: 27.1 pg (ref 26.0–34.0)
MCHC: 31.5 g/dL (ref 30.0–36.0)
MCV: 86.1 fL (ref 78.0–100.0)
MONOS PCT: 6 % (ref 3–12)
MPV: 11.2 fL (ref 8.6–12.4)
Monocytes Absolute: 0.4 10*3/uL (ref 0.1–1.0)
Neutro Abs: 4.7 10*3/uL (ref 1.7–7.7)
Neutrophils Relative %: 74 % (ref 43–77)
PLATELETS: 281 10*3/uL (ref 150–400)
RBC: 4.09 MIL/uL (ref 3.87–5.11)
RDW: 14.1 % (ref 11.5–15.5)
WBC: 6.4 10*3/uL (ref 4.0–10.5)

## 2014-11-25 LAB — POCT GLYCOSYLATED HEMOGLOBIN (HGB A1C): Hemoglobin A1C: 6.8

## 2014-11-25 LAB — TSH: TSH: 0.499 u[IU]/mL (ref 0.350–4.500)

## 2014-11-25 LAB — FERRITIN: Ferritin: 12 ng/mL (ref 10–291)

## 2014-11-25 MED ORDER — LISINOPRIL 10 MG PO TABS: 10.0000 mg | ORAL_TABLET | Freq: Every day | ORAL | Status: DC

## 2014-11-25 NOTE — Assessment & Plan Note (Addendum)
A: A1c down to 6.8, though pt does report weight loss and poor appetite (see separate problem list note). Compliant with metformin, Lantus, and Novolog.  P: Continue current meds / doses (Lantus 7 units daily, Novolog 2-4 units TID, metformin 1g BID). Could consider gastric emptying study to see if there is a diabetic gastroparesis component to weight loss / appetite, etc, though pt reports no GI symptoms. Repeat A1c in about 3 months. F/u with ophtho for yearly exam when able. F/u otherwise PRN.

## 2014-11-25 NOTE — Assessment & Plan Note (Signed)
Mildly elevated BP, but out of lisinopril for about 2 weeks. Continue lisinopril 10 mg daily (refilled today), HCTZ 12.5 mg daily, and Norvasc 5 mg daily, plus statin and ASA. CMP drawn today to check kidney function; see separate problem list notes. F/u in 3 months.

## 2014-11-25 NOTE — Progress Notes (Signed)
   Subjective:    Patient ID: [name], female    DOB: 09/02/1956, 59 y.o.   MRN: [id number]  HPI: Pt presents to clinic for f/u on DM, HTN, and HLD. She is also concerned for some weight loss over the past several months.  DM - reports compliance with Lantus 7 units daily, Novolog three times a day with meals (2-4 units) - also reports compliance with metformin 1000 mg twice a day - denies numbness / tingling in feet - reports she is due to see ophthalmologist for yearly exam but can't afford copay, right now.  HTN - reports compliance with Norvasc 5 mg and HCTZ 12.5 mg but has been out of lisinopril 10 mg for about 2 weeks - denies chest pain, SOB, swelling in feet, or change in vision  HLD - reports compliance with Crestor - denies myalgias, new / different GI complaints  Weight loss - unintentional for about the past 1 year, down ~13 lb in the past 6 months - denies blood in stool, hemoptysis, back pain at night, night sweats, fevers / chills, N/V - does endorse poor appetite for "quite a while" and sometimes skips meals due to no appetite - reports no known family history of cancer or thyroid disease - denies frank hot / cold intolerance but does endorse some fast heartbeat without palpitations - reports she is up to date on mammogram and colonoscopy screening (had normal EGD and colonoscopy per her report in 2008)  Review of Systems: As above. Otherwise, full 12-system ROS was reviewed and all negative.      Objective:   Physical Exam BP 143/79 mmHg  Pulse 116  Temp(Src) 98.6 F (37 C) (Oral)  Ht 5\' 4"  (1.626 m)  Wt 138 lb (62.596 kg)  BMI 23.68 kg/m2 Gen: well-appearing adult female in NAD HEENT: [place]/AT, sclerae/conjunctivae clear, no lid lag, EOMI, PERRLA   MMM, posterior oropharynx clear, no cervical lymphadenopathy  neck supple with full ROM, no masses appreciated; thyroid not enlarged   TM's not well-visualized secondary to cerumen bilaterally Cardio: mild  tachycardia without discomfort, no murmur appreciated; distal pulses intact/symmetric Pulm: CTAB, no wheezes, normal WOB  Abd: soft, nondistended, BS+, no HSM Ext: warm/well-perfused, no cyanosis/clubbing/edema MSK: strength 5/5 in all four extremities, no frank joint deformity/effusion  normal ROM to all four extremities with no point muscle/bony tenderness in spine Neuro/Psych: alert/oriented, sensation grossly intact; normal gait/balance  mood euthymic with congruent affect     Assessment & Plan:  See problem list notes.

## 2014-11-25 NOTE — Assessment & Plan Note (Signed)
A: Uncertain cause of about 6-12 months of 13-15 lb weight loss (broad differential in any patient), though thyroid dysfunction is possible given tachycardia. Up to date on cancer screening without other frank worrisome symptoms. Poor appetite in general may be the major cause, but underlying etiology for that itself is unclear.  P: CBC, CMP, TSH, and iron studies drawn today (hx of anemia). Will f/u results as appropriate. Could consider gastric emptying study to see if there is a diabetic gastroparesis component to weight loss / appetite, etc, though pt has no specific GI complaints. Specific f/u to be determined after labs result and any further work-up is pursued.

## 2014-11-25 NOTE — Patient Instructions (Addendum)
Thank you for coming in, today!  Your blood pressure is a little high, so I will refill your lisinopril. We will check your kidney, liver, and thyroid functions today. We will also check your iron levels and your hemoglobin.  Some of your weight loss might be due to your thyroid. It might also just be poor appetite.  Depending on what your labs look like, we'll see what else we need to do next. Come back to see me in about 3 months (late April). If there is something unusual on your labs, I'll call you and let you know if I want to see you sooner.  Please feel free to call with any questions or concerns at any time, at [phone]. --Dr. Casper HarrisonStreet

## 2014-11-25 NOTE — Assessment & Plan Note (Signed)
A: Compliant with Crestor 20 mg daily, without obvious frank side effects.  P: Continue Crestor daily. CMP drawn today to check LFT's; see separate problem list notes. Likely recheck lipid panel in late April (last check March 05, 2014). F/u PRN, otherwise.

## 2014-11-27 ENCOUNTER — Telehealth: Payer: Self-pay | Admitting: Family Medicine

## 2014-11-27 LAB — IBC PANEL
%SAT: 11 % — ABNORMAL LOW (ref 20–55)
TIBC: 363 ug/dL (ref 250–470)
UIBC: 322 ug/dL (ref 125–400)

## 2014-11-27 LAB — COMPREHENSIVE METABOLIC PANEL
ALK PHOS: 82 U/L (ref 39–117)
ALT: <8 U/L (ref 0–35)
AST: 13 U/L (ref 0–37)
Albumin: 4.4 g/dL (ref 3.5–5.2)
BUN: 15 mg/dL (ref 6–23)
CO2: 27 mEq/L (ref 19–32)
Calcium: 10.3 mg/dL (ref 8.4–10.5)
Chloride: 96 mEq/L (ref 96–112)
Creat: 0.96 mg/dL (ref 0.50–1.10)
Glucose, Bld: 152 mg/dL — ABNORMAL HIGH (ref 70–99)
Potassium: 3.6 mEq/L (ref 3.5–5.3)
Sodium: 136 mEq/L (ref 135–145)
Total Bilirubin: 0.3 mg/dL (ref 0.2–1.2)
Total Protein: 7.4 g/dL (ref 6.0–8.3)

## 2014-11-27 LAB — IRON: Iron: 41 ug/dL — ABNORMAL LOW (ref 42–145)

## 2014-11-27 MED ORDER — TRAMADOL HCL 50 MG PO TABS: 25.0000 mg | ORAL_TABLET | Freq: Three times a day (TID) | ORAL | Status: DC | PRN

## 2014-11-27 NOTE — Telephone Encounter (Signed)
Patient informed. 

## 2014-11-27 NOTE — Telephone Encounter (Signed)
Red Team: Please let pt know that I will leave Rx for tramadol at front desk for pt to pick up at her convenience. Thanks! --CMS

## 2014-11-27 NOTE — Telephone Encounter (Signed)
Pt was seen here this past Tuesday, said she talked with the doctor about pain in her neck, says it is worse today, wants to know if MD would prescribe tramadol?

## 2014-11-28 ENCOUNTER — Telehealth: Payer: Self-pay | Admitting: Family Medicine

## 2014-11-28 MED ORDER — FERROUS SULFATE 325 (65 FE) MG PO TABS: 325.0000 mg | ORAL_TABLET | Freq: Two times a day (BID) | ORAL | Status: DC

## 2014-11-28 NOTE — Addendum Note (Signed)
Addended by: Bobbye MortonSTREET, Ora Bollig M on: 11/28/2014 01:35 PM   Modules accepted: Orders

## 2014-11-28 NOTE — Telephone Encounter (Signed)
Called pt to discuss recent labs. TSH and CMP unremarkable. Hb and iron studies suggest mild iron deficiency. [facility]ID [facility]. Will plan to recheck Hb at next f/u appt. Pt to f/u sooner if needed, if weight loss continues. Pt voices understanding. --CMS

## 2014-12-18 ENCOUNTER — Encounter: Payer: Self-pay | Admitting: Family Medicine

## 2014-12-18 ENCOUNTER — Ambulatory Visit (INDEPENDENT_AMBULATORY_CARE_PROVIDER_SITE_OTHER): Payer: Self-pay | Admitting: Family Medicine

## 2014-12-18 VITALS — BP 125/75 | HR 112 | Temp 98.3°F | Ht 64.0 in | Wt 135.5 lb

## 2014-12-18 DIAGNOSIS — R634 Abnormal weight loss: Secondary | ICD-10-CM

## 2014-12-18 DIAGNOSIS — R1319 Other dysphagia: Secondary | ICD-10-CM

## 2014-12-18 DIAGNOSIS — R198 Other specified symptoms and signs involving the digestive system and abdomen: Secondary | ICD-10-CM

## 2014-12-18 DIAGNOSIS — R110 Nausea: Secondary | ICD-10-CM

## 2014-12-18 DIAGNOSIS — E119 Type 2 diabetes mellitus without complications: Secondary | ICD-10-CM

## 2014-12-18 DIAGNOSIS — R1310 Dysphagia, unspecified: Secondary | ICD-10-CM

## 2014-12-18 MED ORDER — MIRTAZAPINE 7.5 MG PO TABS: 7.5000 mg | ORAL_TABLET | Freq: Every day | ORAL | Status: DC

## 2014-12-18 NOTE — Assessment & Plan Note (Signed)
A: Uncertain cause of weight loss; down about 15 lb in the past year, with 3 lb loss since last month. Recent CBC, CMP, and TSH only remarkable for mild anemia, and pt is now taking iron. Poor appetite in general with resulting poor PO intake seems to be the major contributor. Cause for poor appetite is likely multifactorial; concern for DM-related gastroparesis +/- gastroesophageal dysmotility or spasm, though frank spasm seems less likely given no pain. Possibly some component of depression, though this may be due to weight loss rather than a cause. Issues seem to be more severe with solids than liquids.  P: Ordered for gastric emptying study (scheduled for 2/11). Rx for Remeron 7.5 mg qHS in the meantime to see if this helps with appetite stimulation. Also recommended trying liquid supplement such as Boost or Ensure, in the meantime. Depending on result of emptying study, may need to consider esophageal manometry though may refer to GI / defer to specialist management for further work-up at that point. F/u in 1 month, or sooner if needed.

## 2014-12-18 NOTE — Progress Notes (Signed)
   Subjective:    Patient ID: [name], female    DOB: [birth year], 59 y.o.   MRN: [id number]  HPI: Pt presents to clinic for f/u of weight loss. She has been having weight loss unintentionally for the past several months to 1 year and has lost from 138 to 135 lbs. She has very poor appetite, which has been going on around the same time period. She feels like she often has to 'force' herself to eat. She feels like she is overall eating "a lot less" since her symptoms started. She describes a sensation of her food "just sitting there" in her throat or stomach, but she has no pain with swallowing or difficulty swallowing. She has more issue with solid food than with liquids.  Note pt has DM and takes Novolog 2-4 units at mealtimes and Lantus 7 units in the morning. Her blood sugar this morning was 112 after it was 187 at bedtime. She does not feel that her blood sugars are dropping.  Review of Systems: As above. She has felt generally well, otherwise, but does state she "feels lousy" and "tired" and "lightheaded" when standing up, sometimes (Not currently). Does endorse some feelings of depression about this issue that were not present before she started losing weight (seems to be reactive to weight loss being ongoing).     Objective:   Physical Exam BP 125/75 mmHg  Pulse 112  Temp(Src) 98.3 F (36.8 C) (Oral)  Ht 5\' 4"  (1.626 m)  Wt 135 lb 8 oz (61.462 kg)  BMI 23.25 kg/m2 Gen: well-appearing adult female in NAD HEENT: [place]/AT, EOMI, PERRLA, MMM Cardio: mild tachycardia, no murmur appreciated (manual recheck pulse ~108) Pulm: CTAB, no wheezes, normal WOB Abd: soft, nontender, BS+ Ext: warm, well-perfused, no LE edema     Assessment & Plan:  See problem list note.

## 2014-12-18 NOTE — Patient Instructions (Signed)
Thank you for coming in, today!  I think there could be a few things causing your weight loss. I think the main thing is the poor appetite.  I want to get an xray study called a gastric emptying study. This will help us see if your esophagus and stomach move food slowly. When they move slowly, that causes nausea and discomfort, and can affect appetite. This is called "gastroparesis" and can be associated with diabetes. If the study shows gastroparesis, we can try some medicines that might help. If this study looks okay, that will help me figure out what else do to.  In the meantime, I will prescribe a medicince called Remeron (mirtazapine), 7.5 mg at bedtime. This might help stimulate your appetite, some. It might also help a little with your mood. You can also try liquid meal supplements (Carnation Instant Breakfast, Ensure, Boost, Glucerna, etc), if you can afford them.  Come back to see me in about 1 month. We'll see how the medicine helps, and see if you're continuing to lose weight or not. If I need to see you back sooner, I'll let you know when I get the results from the xray.  Please feel free to call with any questions or concerns at any time, at [phone]. --Dr. Casper HarrisonStreet

## 2014-12-25 ENCOUNTER — Telehealth: Payer: Self-pay | Admitting: Family Medicine

## 2014-12-25 ENCOUNTER — Ambulatory Visit (HOSPITAL_COMMUNITY)
Admission: RE | Admit: 2014-12-25 | Discharge: 2014-12-25 | Disposition: A | Discharge status: Home / Self Care | Payer: Self-pay | Source: Ambulatory Visit | Attending: Family Medicine | Admitting: Family Medicine

## 2014-12-25 DIAGNOSIS — R198 Other specified symptoms and signs involving the digestive system and abdomen: Secondary | ICD-10-CM

## 2014-12-25 DIAGNOSIS — R110 Nausea: Secondary | ICD-10-CM

## 2014-12-25 DIAGNOSIS — R1319 Other dysphagia: Secondary | ICD-10-CM

## 2014-12-25 DIAGNOSIS — E119 Type 2 diabetes mellitus without complications: Secondary | ICD-10-CM

## 2014-12-25 DIAGNOSIS — R1310 Dysphagia, unspecified: Secondary | ICD-10-CM

## 2014-12-25 DIAGNOSIS — R634 Abnormal weight loss: Secondary | ICD-10-CM

## 2014-12-25 MED ORDER — TECHNETIUM TC 99M SULFUR COLLOID
2.0000 | Freq: Once | INTRAVENOUS | Status: AC | PRN
  Administered 2014-12-25: 2 via ORAL

## 2014-12-25 NOTE — Telephone Encounter (Signed)
Called pt to relay normal result from gastric emptying study. She has started taking Remeron and feels like it is helping her appetite "somewhat." will plan to f/;u with her in about 1 month, then re-assess. May need to consider GI referral or other work-up, if weight loss continues. Pt voiced understanding. --CMS

## 2015-01-02 ENCOUNTER — Other Ambulatory Visit: Payer: Self-pay | Admitting: Family Medicine

## 2015-01-05 ENCOUNTER — Telehealth: Payer: Self-pay | Admitting: Family Medicine

## 2015-01-05 DIAGNOSIS — M542 Cervicalgia: Secondary | ICD-10-CM

## 2015-01-05 NOTE — Telephone Encounter (Signed)
Called pt back to discuss tramadol; she takes this for neck pain. Tylenol has not worked in the past for her. She has used Aleve in the past, but states "one of the other doctors" (I believe this was Dr. Michail JewelsMarsh) told her to use tramadol instead, as she was taking Aleve 2-3+ times per day, nearly every day.  Advised pt to stay off tramadol indefinitely for now, and that she can go back to using Aleve but to take it as little as possible, not every day, and only 1 or 2 pills per day. Will refer her per her preference to orthopedics, as well, then f/u as needed. Pt voiced understanding. --CMS

## 2015-01-05 NOTE — Telephone Encounter (Signed)
Patient states that she believes that the Tramadol that she was prescribed last month is causing a tightness is her chest. She states that she stopped taking them for a few days and the sensation went away but once she started taking them again the chest tightness came back. Please advise.

## 2015-01-14 ENCOUNTER — Other Ambulatory Visit: Payer: Self-pay | Admitting: Family Medicine

## 2015-01-14 DIAGNOSIS — G4700 Insomnia, unspecified: Secondary | ICD-10-CM

## 2015-01-14 MED ORDER — ZOLPIDEM TARTRATE 5 MG PO TABS: 5.0000 mg | ORAL_TABLET | Freq: Every evening | ORAL | Status: DC | PRN

## 2015-01-14 NOTE — Telephone Encounter (Signed)
Pt is aware of Rx for Ambien ready for pick up.  Clovis PuMartin, Obinna Ehresman L, RN

## 2015-01-14 NOTE — Telephone Encounter (Signed)
Covering Box for PCP  Refill request for Remus Lofflerambien, it looks like she has been on this med for insomnia for 1+ year per a phone note from Dr. Casper HarrisonStreet in July 2015. Will refill X 1 month.   Murtis SinkSam Angad Nabers, MD [facility] Resident, PGY-3 01/14/2015, 9:49 AM

## 2015-01-14 NOTE — Telephone Encounter (Signed)
Refill request for Ambien.  

## 2015-01-22 ENCOUNTER — Encounter: Payer: Self-pay | Admitting: Family Medicine

## 2015-01-22 ENCOUNTER — Ambulatory Visit (INDEPENDENT_AMBULATORY_CARE_PROVIDER_SITE_OTHER): Payer: Self-pay | Admitting: Family Medicine

## 2015-01-22 VITALS — BP 126/63 | HR 98 | Temp 98.1°F | Ht 64.0 in | Wt 145.3 lb

## 2015-01-22 DIAGNOSIS — R634 Abnormal weight loss: Secondary | ICD-10-CM

## 2015-01-22 DIAGNOSIS — M542 Cervicalgia: Secondary | ICD-10-CM

## 2015-01-22 MED ORDER — MIRTAZAPINE 7.5 MG PO TABS: 7.5000 mg | ORAL_TABLET | Freq: Every day | ORAL | Status: DC

## 2015-01-22 MED ORDER — TRAMADOL HCL 50 MG PO TABS: 25.0000 mg | ORAL_TABLET | Freq: Three times a day (TID) | ORAL | Status: DC | PRN

## 2015-01-22 NOTE — Patient Instructions (Signed)
Thank you for coming in, today!  I think the mirtazapine is doing well for your appetite and your weight. I want you to stay on it for at least a few months.  I will refill your tramadol for neck pain. Don't take more than 1 pill every 8 hours. You can take Tylenol or Aleve with it, if you need.  Come back to see me in about 2 months to check on your diabetes. Call or come back sooner if you need me. My last day is June 30th. After that you'll have a new doctor here in this same building.  Please feel free to call with any questions or concerns at any time, at [phone]. --Dr. Casper HarrisonStreet

## 2015-01-22 NOTE — Progress Notes (Signed)
   Subjective:    Patient ID: [name], female    DOB: 08-03-1956, 59 y.o.   MRN: [id number]  HPI: Pt presents to clinic for follow-up of weight loss and neck pain. She has been taking mirtazapine at night, which has been helping significantly with her appetite. She has gained 10 pounds and feels like her appetite has been much better. She feels better in terms of her mood, as well.  Her neck pain is mostly on the right side, in the back, with some radiation to her jawbone occasionally. It is described as a "toothache-type" pain. Tramadol has helped significantly in the past but she feels like she recently took "too much of it" and had some chest discomfort with it (see telephone note 2/22), but feels like she would like to retry it. She has been taking Aleve but it bothers her stomach because she has had to take too much.  She reports compliance with her medications for diabetes and high blood pressure.  Review of Systems: As above.     Objective:   Physical Exam BP 126/63 mmHg  Pulse 98  Temp(Src) 98.1 F (36.7 C) (Oral)  Ht 5\' 4"  (1.626 m)  Wt 145 lb 4.8 oz (65.908 kg)  BMI 24.93 kg/m2 Gen: well-appearing adult female in NAD HEENT: [place]/AT, EOMI, PERRLA Neck: supple, ROM full; mild-moderate right neck paraspinal muscle tightness with some tenderness to direct / deep palpation  No cervical lymphadenopathy  No frank radicular-type symptoms Cardio: RRR, no murmur appreciated Pulm: CTAB, no wheezes noted Abd: soft, nontender, BS+ Ext: warm, well-perfused, no LE edema     Assessment & Plan:  59yo female with unintentional weight loss of uncertain cause, now starting to regain weight with improved appetite on Remeron - continue Remeron at 7.5 mg qHS; new Rx provided, today - MSK neck pain chronic and mild / moderate in nature; previous questionable reaction to tramadol but pt would like to try it again, as it did help her pain - new Rx for tramadol provided - plan to f/u in 1-2  months for repeat weight check and likely formal DM f/u at that time, as well  Bobbye Mortonhristopher M Brenden Rudman, MD PGY-3, [facility] 01/22/2015, 10:42 PM

## 2015-02-12 ENCOUNTER — Other Ambulatory Visit: Payer: Self-pay | Admitting: Family Medicine

## 2015-02-12 DIAGNOSIS — G4700 Insomnia, unspecified: Secondary | ICD-10-CM

## 2015-02-12 MED ORDER — MIRTAZAPINE 15 MG PO TABS: 7.5000 mg | ORAL_TABLET | Freq: Every day | ORAL | Status: DC

## 2015-02-12 MED ORDER — ZOLPIDEM TARTRATE 5 MG PO TABS: 5.0000 mg | ORAL_TABLET | Freq: Every evening | ORAL | Status: DC | PRN

## 2015-02-12 NOTE — Telephone Encounter (Signed)
Red Team: Printed Rx left at front desk. Pt can pick up Rx at her convenience. Thanks! --CMS

## 2015-02-12 NOTE — Telephone Encounter (Signed)
Need zolpidem refill.

## 2015-02-12 NOTE — Telephone Encounter (Signed)
Patient informed, expressed understanding. 

## 2015-02-16 ENCOUNTER — Other Ambulatory Visit: Payer: Self-pay | Admitting: Family Medicine

## 2015-02-16 MED ORDER — AMLODIPINE BESYLATE 10 MG PO TABS: 10.0000 mg | ORAL_TABLET | Freq: Every day | ORAL | Status: DC

## 2015-02-16 NOTE — Telephone Encounter (Signed)
Received fax from [facility], requesting new Rx for amlodipine and lab update as well as clarification of insulin regimen. New Rx for amlodipine faxed and labs sent, as well. F/u PRN. --CMS

## 2015-02-23 ENCOUNTER — Ambulatory Visit: Payer: Self-pay | Admitting: Family Medicine

## 2015-02-23 ENCOUNTER — Ambulatory Visit (INDEPENDENT_AMBULATORY_CARE_PROVIDER_SITE_OTHER): Payer: Self-pay | Admitting: Family Medicine

## 2015-02-23 ENCOUNTER — Encounter: Payer: Self-pay | Admitting: Family Medicine

## 2015-02-23 VITALS — BP 147/85 | HR 85 | Temp 98.6°F | Ht 64.0 in | Wt 149.7 lb

## 2015-02-23 DIAGNOSIS — H6123 Impacted cerumen, bilateral: Secondary | ICD-10-CM

## 2015-02-23 DIAGNOSIS — H918X1 Other specified hearing loss, right ear: Secondary | ICD-10-CM

## 2015-02-23 DIAGNOSIS — H6121 Impacted cerumen, right ear: Secondary | ICD-10-CM

## 2015-02-23 MED ORDER — CARBAMIDE PEROXIDE 6.5 % OT SOLN: 5.0000 [drp] | Freq: Two times a day (BID) | OTIC | Status: DC | PRN

## 2015-02-23 NOTE — Patient Instructions (Signed)
Thank you for coming in, today!  You had a lot of wax build-up in both ears. Don't put anything like Q-tips down into your ears -- this can make things worse.  Try using Colace drops (over the counter) or hydrogen peroxide (if you already have some at home) drops in your ears. Put a few drops in each ear, let it sit for several minutes, then take a shower and let it rinse out. You can do this a couple of times per day, a few days per week, as needed. If neither of these things help, you can use a medicine called Debrox. I'll give you a prescription for it, today. It works in a similar way, to dissolve and soften ear wax.  Come back to see me as you need, otherwise. Please feel free to call with any questions or concerns at any time, at [phone]. --Dr. Casper HarrisonStreet

## 2015-02-23 NOTE — Progress Notes (Addendum)
   Subjective:    Patient ID: [name], female    DOB: Dec 02, 1955, 59 y.o.   MRN: [id number]  HPI: Pt presents to clinic with complaint of difficulty hearing in her right ear for about 1-2 weeks. She feels like it did in her remote history when she had wax "clogging" her ear; this improved with flushing in the office. She has no pain or itching, headaches, fever / chills, N/V, sore throat, or left-sided symptoms. She has no tinnitus. She states she uses Q-tips in her ears sometimes; the last time was a few weeks ago. Overall, her symptoms are not worsening, but aren't any better.  Review of Systems: As above.     Objective:   Physical Exam BP 147/85 mmHg  Pulse 85  Temp(Src) 98.6 F (37 C) (Oral)  Ht 5\' 4"  (1.626 m)  Wt 149 lb 11.2 oz (67.903 kg)  BMI 25.68 kg/m2 Gen: well-appearing adult female in NAD HEENT: [place]/AT, EOMI, PERRLA, MMM  Bilateral cerumen impactions noted on initial exam, TM's not able to be visualized  Nasal mucosae and posterior oropharynx clear Neck: supple, normal ROM, no lymphadenopathy Cardio: RRR, no murmur appreciated Pulm: CTAB, no wheezes, normal WOB Abd: soft, nontender, BS+ Ext: warm, well-perfused, no LE edema  Repeat exam after ear flushing; left ear manually debrided with plastic ear currete:   right TM's clear, left partially obstructed by residual cerumen but visualized portion clear  ear canals slightly macerated but no frank infection     Assessment & Plan:  59yo female with bilateral cerumen impaction; hearing loss in right ear improved with ear flushing - recommended Colace drops or hydrogen peroxide to ears as needed - printed Rx given for Debrox, as well - strongly recommended against use of Q-tips or other foreign objects placed into ears - f/u as needed, otherwise  Bobbye Mortonhristopher M Brenden Rudman, MD PGY-3, [facility] 02/23/2015, 3:26 PM

## 2015-02-24 ENCOUNTER — Other Ambulatory Visit: Payer: Self-pay | Admitting: Family Medicine

## 2015-02-24 DIAGNOSIS — Z1231 Encounter for screening mammogram for malignant neoplasm of breast: Secondary | ICD-10-CM

## 2015-03-05 ENCOUNTER — Ambulatory Visit (HOSPITAL_COMMUNITY): Payer: No Typology Code available for payment source

## 2015-03-12 ENCOUNTER — Ambulatory Visit (HOSPITAL_COMMUNITY)
Admission: RE | Admit: 2015-03-12 | Discharge: 2015-03-12 | Disposition: A | Discharge status: Home / Self Care | Payer: No Typology Code available for payment source | Source: Ambulatory Visit | Attending: Family Medicine | Admitting: Family Medicine

## 2015-03-12 ENCOUNTER — Other Ambulatory Visit: Payer: Self-pay | Admitting: Internal Medicine

## 2015-03-12 DIAGNOSIS — Z1231 Encounter for screening mammogram for malignant neoplasm of breast: Secondary | ICD-10-CM

## 2015-03-17 ENCOUNTER — Other Ambulatory Visit: Payer: Self-pay | Admitting: Family Medicine

## 2015-03-17 MED ORDER — TRAMADOL HCL 50 MG PO TABS: 25.0000 mg | ORAL_TABLET | Freq: Three times a day (TID) | ORAL | Status: DC | PRN

## 2015-03-17 NOTE — Telephone Encounter (Signed)
Red Team: Please call pt to let her know she can pick up a prescription at the front desk any time between now and Thursday, or she can wait until her appointment. Thanks! --CMS

## 2015-03-17 NOTE — Telephone Encounter (Signed)
Spoke with pt she said she already picked it up. Deseree Bruna PotterBlount, CMA

## 2015-03-17 NOTE — Telephone Encounter (Signed)
Needs refill on tramadol Has run out but has appt on Thurs

## 2015-03-19 ENCOUNTER — Telehealth: Payer: Self-pay | Admitting: Family Medicine

## 2015-03-19 ENCOUNTER — Ambulatory Visit (INDEPENDENT_AMBULATORY_CARE_PROVIDER_SITE_OTHER): Payer: No Typology Code available for payment source | Admitting: Family Medicine

## 2015-03-19 ENCOUNTER — Encounter: Payer: Self-pay | Admitting: Family Medicine

## 2015-03-19 VITALS — BP 127/67 | HR 94 | Temp 98.0°F | Ht 64.0 in | Wt 150.0 lb

## 2015-03-19 DIAGNOSIS — I10 Essential (primary) hypertension: Secondary | ICD-10-CM

## 2015-03-19 DIAGNOSIS — E1169 Type 2 diabetes mellitus with other specified complication: Secondary | ICD-10-CM

## 2015-03-19 DIAGNOSIS — E785 Hyperlipidemia, unspecified: Secondary | ICD-10-CM

## 2015-03-19 DIAGNOSIS — E119 Type 2 diabetes mellitus without complications: Secondary | ICD-10-CM

## 2015-03-19 DIAGNOSIS — Z114 Encounter for screening for human immunodeficiency virus [HIV]: Secondary | ICD-10-CM

## 2015-03-19 DIAGNOSIS — D509 Iron deficiency anemia, unspecified: Secondary | ICD-10-CM

## 2015-03-19 LAB — LIPID PANEL
CHOL/HDL RATIO: 1.8 ratio
Cholesterol: 155 mg/dL (ref 0–200)
HDL: 84 mg/dL (ref >=46)
LDL CALC: 54 mg/dL (ref 0–99)
TRIGLYCERIDES: 87 mg/dL (ref <150)
VLDL: 17 mg/dL (ref 0–40)

## 2015-03-19 LAB — COMPREHENSIVE METABOLIC PANEL
ALT: 11 U/L (ref 0–35)
AST: 26 U/L (ref 0–37)
Albumin: 4.4 g/dL (ref 3.5–5.2)
Alkaline Phosphatase: 86 U/L (ref 39–117)
BILIRUBIN TOTAL: 0.3 mg/dL (ref 0.2–1.2)
BUN: 12 mg/dL (ref 6–23)
CO2: 23 meq/L (ref 19–32)
CREATININE: 0.97 mg/dL (ref 0.50–1.10)
Calcium: 10.2 mg/dL (ref 8.4–10.5)
Chloride: 96 mEq/L (ref 96–112)
GLUCOSE: 103 mg/dL — AB (ref 70–99)
Potassium: 4.5 mEq/L (ref 3.5–5.3)
Sodium: 136 mEq/L (ref 135–145)
TOTAL PROTEIN: 7.7 g/dL (ref 6.0–8.3)

## 2015-03-19 LAB — IRON AND TIBC
%SAT: 30 % (ref 20–55)
Iron: 100 ug/dL (ref 42–145)
TIBC: 328 ug/dL (ref 250–470)
UIBC: 228 ug/dL (ref 125–400)

## 2015-03-19 LAB — POCT GLYCOSYLATED HEMOGLOBIN (HGB A1C): Hemoglobin A1C: 6.9

## 2015-03-19 MED ORDER — LISINOPRIL 10 MG PO TABS: 10.0000 mg | ORAL_TABLET | Freq: Every day | ORAL | Status: DC

## 2015-03-19 NOTE — Assessment & Plan Note (Signed)
A: A1c at goal toady (6.9), with maintained improved appetite and weight, as well as energy level. Compliant with metformin, Lantus, and Novolog.  P: Continue metformin 1000 mg BID, Lantus 7 units daily, Novolog 2-4 units TID. Referred today back to ophtho for yearly eye exam. F/u in 3 months or sooner as needed.

## 2015-03-19 NOTE — Telephone Encounter (Signed)
[facility] for a general

## 2015-03-19 NOTE — Assessment & Plan Note (Signed)
Compliant with iron supplement for the last few months. Energy level and weight improved, so expect anemia may have been related to poor PO intake / incidental finding back in January. Will recheck CBC and iron panel, today and f/u as needed. May discontinue iron supplement if iron panel and Hb better, but continue for now.

## 2015-03-19 NOTE — Assessment & Plan Note (Signed)
A: Compliant with Crestor 20 mg daily with no side effects. Last lipid panel in April of last year.  P: Continue Crestor, checking CMP and lipid panel today. F/u at next DM visit in 3 months, or sooner if needed.

## 2015-03-19 NOTE — Telephone Encounter (Signed)
Referral placed, today. Thanks. --CMS

## 2015-03-19 NOTE — Patient Instructions (Signed)
Thank you for coming in, today!  Everything looks fine, today.  Don't change any of your medicines. Keep taking the amlodipine as a half-tablet (5 mg instead of 10).  I will check your blood work today and call you or write you a letter with the results. Get your eye exam done and ask them to send me the notes.  Come back to see me in 1 month and we'll recheck your blood pressure. Please feel free to call with any questions or concerns at any time, at [phone]. --Dr. Casper HarrisonStreet

## 2015-03-19 NOTE — Progress Notes (Signed)
   Subjective:    Patient ID: [name], female    DOB: 05-08-1956, 59 y.o.   MRN: [id number]  HPI: Pt presents to clinic for f/u on DM, HTN, and HLD. She also wants to f/u on weight loss and anemia.  DM - pt pleased to see A1c still at goal (6.9 today) - reports compliance with Lantus 7 units daily, Novolog three times a day with meals (2-4 units) - also reports compliance with metformin 1000 mg twice a day - denies numbness / tingling in feet - due to see ophthalmologist and will schedule with him, soon  HTN - reports compliance with Norvasc 5 mg; has 10 mg tablets but these made her dizzy so she started taking half-tablets - also compliant with lisinopril 10 mg daily and HCTZ 12.5 mg daily without side effects - denies chest pain, SOB, swelling in feet, or change in vision  HLD - reports compliance with Crestor - denies myalgias, new / different GI complaints  Weight loss / anemia - weight loss much improved with weight re-gain to 150, recently (pt very pleased with mood, as well) on mirtazapine - pt has been compliant with iron supplementation without significant side effects  Review of Systems: As above. Otherwise, full 12-system ROS was reviewed and all negative.      Objective:   Physical Exam BP 127/67 mmHg  Pulse 94  Temp(Src) 98 F (36.7 C) (Oral)  Ht 5\' 4"  (1.626 m)  Wt 150 lb (68.04 kg)  BMI 25.73 kg/m2 Gen: well-appearing adult female in NAD HEENT: [place]/AT, sclerae/conjunctivae clear, no lid lag, EOMI, PERRLA   MMM, posterior oropharynx clear, no cervical lymphadenopathy  neck supple with full ROM, no masses appreciated; thyroid not enlarged   TM's not well-visualized secondary to cerumen bilaterally Cardio: mild tachycardia without discomfort, no murmur appreciated; distal pulses intact/symmetric Pulm: CTAB, no wheezes, normal WOB  Abd: soft, nondistended, BS+, no HSM Ext: warm/well-perfused, no cyanosis/clubbing/edema MSK: strength 5/5 in all four  extremities, no frank joint deformity/effusion  normal ROM to all four extremities with no point muscle/bony tenderness in spine Neuro/Psych: alert/oriented, sensation grossly intact; normal gait/balance  mood euthymic with congruent affect     Assessment & Plan:  Generally doing well. DM and HTN controlled, compliant with statin for HLD. Weight improved / now stable, with improved appetite and energy level / mood on mirtazapine, so continue for now. May consider stopping mirtazapine in the future. Has never had HIV screening; will check with labs, today. See problem list notes, otherwise.

## 2015-03-19 NOTE — Assessment & Plan Note (Signed)
A: BP controlled on lisinopril 10 mg, HCTZ 12.5 mg, and Norvasc 5 mg daily. Also on ASA and statin.  P: Continue current meds and recheck in 1 month to see if Norvasc needs to be adjusted (or could potentially be stopped). Checking CMP, today.

## 2015-03-20 ENCOUNTER — Encounter: Payer: Self-pay | Admitting: Family Medicine

## 2015-03-20 LAB — HIV ANTIBODY (ROUTINE TESTING W REFLEX): HIV 1&2 Ab, 4th Generation: NONREACTIVE

## 2015-04-14 ENCOUNTER — Other Ambulatory Visit: Payer: Self-pay | Admitting: Family Medicine

## 2015-04-14 DIAGNOSIS — G4700 Insomnia, unspecified: Secondary | ICD-10-CM

## 2015-04-14 MED ORDER — ZOLPIDEM TARTRATE 5 MG PO TABS: 5.0000 mg | ORAL_TABLET | Freq: Every evening | ORAL | Status: DC | PRN

## 2015-04-14 NOTE — Telephone Encounter (Signed)
Mrs. [name] is calling because she would like a refill on Ambien. Please contact her once it is completed. Thank you, Dorothey BasemanSadie Reynolds, ASA

## 2015-04-14 NOTE — Telephone Encounter (Signed)
Red Team: Please call pt to let her know she can pick up a printed Rx at the front desk at her convenience. Thanks! --CMS

## 2015-04-15 NOTE — Telephone Encounter (Signed)
Patient informed. 

## 2015-04-17 ENCOUNTER — Other Ambulatory Visit: Payer: Self-pay | Admitting: *Deleted

## 2015-04-17 MED ORDER — INSULIN ASPART 100 UNIT/ML ~~LOC~~ SOLN: 2.0000 [IU] | Freq: Three times a day (TID) | SUBCUTANEOUS | Status: DC

## 2015-04-21 ENCOUNTER — Encounter: Payer: Self-pay | Admitting: Family Medicine

## 2015-04-21 ENCOUNTER — Ambulatory Visit (INDEPENDENT_AMBULATORY_CARE_PROVIDER_SITE_OTHER): Payer: Self-pay | Admitting: Family Medicine

## 2015-04-21 VITALS — BP 122/70 | HR 90 | Temp 98.0°F | Ht 64.0 in | Wt 149.0 lb

## 2015-04-21 DIAGNOSIS — D509 Iron deficiency anemia, unspecified: Secondary | ICD-10-CM

## 2015-04-21 DIAGNOSIS — I10 Essential (primary) hypertension: Secondary | ICD-10-CM

## 2015-04-21 LAB — CBC
HEMATOCRIT: 31.3 % — AB (ref 36.0–46.0)
Hemoglobin: 10.2 g/dL — ABNORMAL LOW (ref 12.0–15.0)
MCH: 27.6 pg (ref 26.0–34.0)
MCHC: 32.6 g/dL (ref 30.0–36.0)
MCV: 84.6 fL (ref 78.0–100.0)
MPV: 10.8 fL (ref 8.6–12.4)
PLATELETS: 205 10*3/uL (ref 150–400)
RBC: 3.7 MIL/uL — ABNORMAL LOW (ref 3.87–5.11)
RDW: 14.6 % (ref 11.5–15.5)
WBC: 5.1 10*3/uL (ref 4.0–10.5)

## 2015-04-21 NOTE — Progress Notes (Signed)
   Subjective:    Patient ID: [name], female    DOB: Mar 30, 1956, 59 y.o.   MRN: [id number]  HPI: Pt presents to clinic for f/u of HTN. She reports compliance with lisinopril, HCTZ, and Norvasc. In the past several weeks, she has had some lightheadness that she thought was secondary to Norvasc at 10 mg, so she started taking 5 mg tablets. She has not had issues with lightheadedness, recently, and has been taking her same medication doses (Norvasc 5, lisinopiril, 10, and HCTZ 12.5). She denies chest pain, leg swelling, headache or change in vision, SOB, or cough.  Of note, pt has a history of DM (doing well without specific complaints), HLD, chronic joint pain for which tramadol is very effective, and iron-deficiency anemia. Her recent iron panel was normal and she continues to take iron supplements. She denies fatigue, unusual bleeding / blood in stool or urine, weakness, or syncope / near-syncope.  Pt also had weight loss (secondary to poor appetite and possibly mild mood issues) earlier in the year which has resolved / improved with Remeron.   Review of Systems: As above.     Objective:   Physical Exam BP 122/70 mmHg  Pulse 115  Temp(Src) 98 F (36.7 C) (Oral)  Ht 5\' 4"  (1.626 m)  Wt 149 lb (67.586 kg)  BMI 25.56 kg/m2 Manual recheck pulse ~90 Gen: well-appearing adult female in NAD HEENT: [place]/AT, EOMI, PERRLA, MMM, TM's clear bilaterally  Nasal mucosae and posterior oropharynx clear Neck: supple, normal ROM, no lymphadenopathy Cardio: RRR, no murmur appreciated Pulm: CTAB, no wheezes, normal WOB Abd: soft, nontender, BS+ Ext: warm, well-perfused, no LE edema     Assessment & Plan:  See problem list notes.

## 2015-04-21 NOTE — Patient Instructions (Signed)
Thank you for coming in, today!  Your blood pressure looks okay, today. STOP taking your Norvasc (amlodipine). It was making you dizzy before. If you check your blood pressure at home and it is still high, or if it is high next time you come back, we might restart Norvasc. Keep taking everything else the same.  I will check the hemoglobin in your blood, today. If it's back up to normal, we can stop your iron once you're finished with the tablets. If it's still low, we'll keep you on the iron tablets and refill them once you're out.  Call your eye doctor to see if you need another referral placed. I put one in, but make sure it went through okay.  Come back to see your new doctor sometime in August. If you need me before June 30th, give me a call.  Please feel free to call with any questions or concerns at any time, at [phone]. --Dr. Casper HarrisonStreet

## 2015-04-21 NOTE — Assessment & Plan Note (Signed)
Compliant with iron supplement in context of weight loss and some fatigue, both of which have improved (expect deficiency may have been related to poor PO intake). Iron panel recently normal, though Hb was still slightly low. Will recheck CBC today -- if Hb improved, will finish out current supply of iron supplement tablets, then discontinue. If Hb still low or low-normal, will continue iron supplements for the time being then recheck in the future for possible discontinuation.

## 2015-04-21 NOTE — Assessment & Plan Note (Signed)
A: BP well-controlled on lisinopril 10 mg, HCTZ 12.5 mg, and Norvasc 5 mg daily. Also compliant with ASA and statin. No current side effects attributable to Norvasc, and BP in a good enough range to trial on lisinopril and HCTZ alone.  P: STOP Norvasc for the time being and continue ACE + HCTZ. Advised pt that if she checks her BP at home and it is high or if her BP is elevated at her next f/u, we may need to restart the Norvasc. Otherwise, f/u in about 2 months with next DM check-up, or sooner if needed.

## 2015-04-23 ENCOUNTER — Telehealth: Payer: Self-pay | Admitting: Family Medicine

## 2015-04-23 MED ORDER — FERROUS SULFATE 325 (65 FE) MG PO TABS: 325.0000 mg | ORAL_TABLET | Freq: Two times a day (BID) | ORAL | Status: DC

## 2015-04-23 NOTE — Telephone Encounter (Signed)
Called pt to relay Hb is still low. Will keep pt on iron supplement, for now (reports she has about 10 tablets left). New Rx left at front desk for pt to pick up at her convenience. Would continue for another 6-8 weeks, then recheck Hb and iron panel and re-eval for continuation or discontinuation of supplement. --CMS

## 2015-04-29 ENCOUNTER — Ambulatory Visit: Payer: No Typology Code available for payment source

## 2015-05-04 ENCOUNTER — Ambulatory Visit: Payer: No Typology Code available for payment source

## 2015-05-06 ENCOUNTER — Other Ambulatory Visit: Payer: Self-pay | Admitting: Family Medicine

## 2015-06-30 ENCOUNTER — Other Ambulatory Visit: Payer: Self-pay | Admitting: *Deleted

## 2015-07-01 MED ORDER — MIRTAZAPINE 15 MG PO TABS: 7.5000 mg | ORAL_TABLET | Freq: Every day | ORAL | Status: DC

## 2015-07-08 ENCOUNTER — Other Ambulatory Visit: Payer: Self-pay | Admitting: Family Medicine

## 2015-07-08 DIAGNOSIS — G4700 Insomnia, unspecified: Secondary | ICD-10-CM

## 2015-07-08 NOTE — Telephone Encounter (Signed)
Please send refill rx to [facility] pharmacy for zolpidem.

## 2015-07-16 ENCOUNTER — Encounter: Payer: Self-pay | Admitting: Family Medicine

## 2015-07-16 ENCOUNTER — Other Ambulatory Visit: Payer: Self-pay | Admitting: Family Medicine

## 2015-07-16 ENCOUNTER — Ambulatory Visit (INDEPENDENT_AMBULATORY_CARE_PROVIDER_SITE_OTHER): Payer: Self-pay | Admitting: Family Medicine

## 2015-07-16 VITALS — BP 142/71 | HR 94 | Temp 98.8°F | Ht 64.0 in | Wt 150.2 lb

## 2015-07-16 DIAGNOSIS — E119 Type 2 diabetes mellitus without complications: Secondary | ICD-10-CM

## 2015-07-16 DIAGNOSIS — Z23 Encounter for immunization: Secondary | ICD-10-CM

## 2015-07-16 DIAGNOSIS — G4700 Insomnia, unspecified: Secondary | ICD-10-CM

## 2015-07-16 DIAGNOSIS — I10 Essential (primary) hypertension: Secondary | ICD-10-CM

## 2015-07-16 DIAGNOSIS — Z1159 Encounter for screening for other viral diseases: Secondary | ICD-10-CM

## 2015-07-16 LAB — POCT GLYCOSYLATED HEMOGLOBIN (HGB A1C): HEMOGLOBIN A1C: 7.6

## 2015-07-16 LAB — HEPATITIS C ANTIBODY: HCV Ab: NEGATIVE

## 2015-07-16 LAB — MICROALBUMIN, URINE: MICROALB UR: 0.9 mg/dL (ref <2.0)

## 2015-07-16 MED ORDER — ZOLPIDEM TARTRATE 5 MG PO TABS: 5.0000 mg | ORAL_TABLET | Freq: Every evening | ORAL | Status: DC | PRN

## 2015-07-16 MED ORDER — LISINOPRIL 10 MG PO TABS: 10.0000 mg | ORAL_TABLET | Freq: Every day | ORAL | Status: DC

## 2015-07-16 NOTE — Assessment & Plan Note (Signed)
Hemoglobin A1C elevated today. Most likely related to dietary indiscretion by patient. Will allow dietary modification and recheck in 3 months.

## 2015-07-16 NOTE — Assessment & Plan Note (Signed)
Slightly elevated today. Patient with pretty good BPs in the past. Recheck at next visit. May need to increase hctz back to  BID

## 2015-07-16 NOTE — Patient Instructions (Signed)
Thank you for coming to see me today. It was a pleasure. Today we talked about:   Diabetes: [name] was elevated today from previous. We decided to hold off on making a change today to your medications and instead will work on minimizing sugary drinks and snacks. We will need to follow-up on your eye exam referral.  Hypertension: I am refilling your lisinopril. Your blood pressure was elevated today but has recently been pretty good. If it continues to be high next time we meet, I think going up on your hydrochlorothiazide will be our next step  Insomnia: I am refilling your Ambien  Labs: I am checking your urine for protein and screening you for hepatitis C  Immunizations: I am giving you a flu shot today  Please make an appointment to see me in 3 months for follow-up.  If you have any questions or concerns, please do not hesitate to call the office at [phone].  Sincerely,  Jacquelin Hawking, MD

## 2015-07-16 NOTE — Assessment & Plan Note (Signed)
Improved with Ambien. Previously tried sleep hygiene techniques. Will refill Ambien

## 2015-07-16 NOTE — Progress Notes (Signed)
    Subjective    [name] is a 59 y.o. female that presents for a follow-up visit for chronic issues.   1. Diabetes: She is adherent with metformin  BID, Novolog 2u three times per day and 7u Lantus once per day. She is checking blood sugars qAC. Her fasting blood sugars are generally in the 90-100 range. She has one "low" to 73 that occurred this week, but she was asymptomatic and drank some juice. She admits that she has been eating more sugary snacks and juices which she thinks is probably attributing to her increase in A1c.  2. Hypertension: She is adherent with lisinopril  daily. No chest pain or shortness of breath.  3. Insomnia: She has been doing well using ambien  daily. She practices good sleep hygiene, including no TV/lights on during bed time. She does not drink caffeine or perform strenuous activities before sleeping.  Social History  Substance Use Topics  . Smoking status: Never Smoker   . Smokeless tobacco: Former Neurosurgeon    Types: Snuff    Quit date: 01/28/2013     Comment: Quit for 1.5 months in past. Current quit date 01/28/2013  . 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    ROS  Per HPI   Objective   BP 142/71 mmHg  Pulse 94  Temp(Src) 98.8 F (37.1 C) (Oral)  Ht  (1.626 m)  Wt 150 lb 3.2 oz (68.13 kg)  BMI 25.77 kg/m2  General: Well appearing, no distress  Assessment and Plan    HYPERTENSION, BENIGN SYSTEMIC Slightly elevated today. Patient with pretty good BPs in the past. Recheck at next visit. May need to increase hctz back to  BID   Insomnia Improved with Ambien. Previously tried sleep hygiene techniques. Will refill Ambien  Diabetes mellitus Hemoglobin A1C elevated today. Most likely related to dietary indiscretion by patient. Will allow dietary modification and recheck in 3 months.

## 2015-07-26 ENCOUNTER — Encounter: Payer: Self-pay | Admitting: Family Medicine

## 2015-08-10 ENCOUNTER — Other Ambulatory Visit: Payer: Self-pay | Admitting: Family Medicine

## 2015-08-12 ENCOUNTER — Other Ambulatory Visit: Payer: Self-pay | Admitting: Family Medicine

## 2015-08-12 NOTE — Telephone Encounter (Signed)
Pt needs refill for metformin sent to [facility] on [address] [name], ASA

## 2015-08-13 ENCOUNTER — Other Ambulatory Visit: Payer: Self-pay | Admitting: Family Medicine

## 2015-08-13 MED ORDER — METFORMIN HCL 1000 MG PO TABS: ORAL_TABLET | ORAL | Status: DC

## 2015-08-13 NOTE — Telephone Encounter (Signed)
Pt calling once more about this request. States that she is out.

## 2015-08-14 NOTE — Telephone Encounter (Signed)
Refill put in yesterday. Please inform patient that in order to insure she does not run out, if she could call for refills at least 2 weeks prior to needing them. Thanks.

## 2015-08-25 ENCOUNTER — Other Ambulatory Visit: Payer: Self-pay | Admitting: *Deleted

## 2015-08-25 MED ORDER — INSULIN GLARGINE 100 UNIT/ML ~~LOC~~ SOLN: 7.0000 [IU] | Freq: Every day | SUBCUTANEOUS | Status: DC

## 2015-08-25 MED ORDER — ROSUVASTATIN CALCIUM 20 MG PO TABS: 20.0000 mg | ORAL_TABLET | Freq: Every day | ORAL | Status: DC

## 2015-08-25 NOTE — Addendum Note (Signed)
Addended by: Jacquelin Hawking A on: 08/25/2015 05:20 PM   Modules accepted: Orders

## 2015-09-03 ENCOUNTER — Telehealth: Payer: Self-pay | Admitting: Family Medicine

## 2015-09-03 NOTE — Telephone Encounter (Signed)
Pt called because the program she is on as of 09/21/15 they will not be giving her Crestor. She is wanting to know which medication you are going to replace that with. Myriam Jacobsonjw

## 2015-09-08 MED ORDER — ATORVASTATIN CALCIUM 40 MG PO TABS: 40.0000 mg | ORAL_TABLET | Freq: Every day | ORAL | Status: DC

## 2015-09-08 NOTE — Telephone Encounter (Signed)
Will switch to atorvastatin 40mg  daily.

## 2015-09-10 LAB — HM DIABETES EYE EXAM

## 2015-09-11 NOTE — Telephone Encounter (Signed)
Pt informed of below, she wanted to be sure that PCP knew that she is using the [facility] for her medications so that is where they will need to go. Forwarding to PCP as an [place]. Lamonte SakaiZimmerman Rumple, Emogene Muratalla D, [place]

## 2015-09-17 ENCOUNTER — Other Ambulatory Visit: Payer: Self-pay | Admitting: *Deleted

## 2015-09-17 MED ORDER — INSULIN GLARGINE 100 UNIT/ML ~~LOC~~ SOLN: 7.0000 [IU] | Freq: Every day | SUBCUTANEOUS | Status: DC

## 2015-09-17 NOTE — Telephone Encounter (Signed)
Please call medication into [facility] at [phone].  [facility] is having problems with their fax machine.  Clovis PuMartin, Tamika L, RN

## 2015-10-12 ENCOUNTER — Other Ambulatory Visit: Payer: Self-pay | Admitting: Family Medicine

## 2015-10-12 DIAGNOSIS — G4700 Insomnia, unspecified: Secondary | ICD-10-CM

## 2015-10-12 NOTE — Telephone Encounter (Signed)
Pt is calling because the [facility] program said that they have not received a fax from us on the patient Lantus. She also needs a refill on her Ambien called in to. Myriam Jacobsonjw

## 2015-10-15 ENCOUNTER — Ambulatory Visit: Payer: Self-pay

## 2015-10-15 NOTE — Telephone Encounter (Signed)
[facility]ettey [facility] to approve refill of lantus with 11 additional refills. Left message on [facility] line [phone] with this information and received call back to confirm dosage. Will route request for ambien to PCP. Confirmed with patient that she would [facility]. Fredderick SeveranceUCATTE, Syrenity Klepacki L, RN

## 2015-10-15 NOTE — Telephone Encounter (Signed)
Pt is here checking on this request. Please contact pt at the earliest convenience. Thank you, [name], ASA

## 2015-10-16 MED ORDER — ZOLPIDEM TARTRATE 5 MG PO TABS: 5.0000 mg | ORAL_TABLET | Freq: Every evening | ORAL | Status: DC | PRN

## 2015-10-16 NOTE — Telephone Encounter (Signed)
Will approve refill.

## 2015-10-19 ENCOUNTER — Telehealth: Payer: Self-pay | Admitting: Family Medicine

## 2015-10-19 NOTE — Telephone Encounter (Signed)
Patients asks refill for Ambien 5 mg. Please, follow up.

## 2015-10-20 ENCOUNTER — Other Ambulatory Visit: Payer: Self-pay | Admitting: Family Medicine

## 2015-10-22 ENCOUNTER — Ambulatory Visit (INDEPENDENT_AMBULATORY_CARE_PROVIDER_SITE_OTHER): Payer: No Typology Code available for payment source | Admitting: Family Medicine

## 2015-10-22 ENCOUNTER — Encounter: Payer: Self-pay | Admitting: Family Medicine

## 2015-10-22 VITALS — BP 138/62 | HR 91 | Temp 98.3°F | Ht 64.0 in | Wt 145.0 lb

## 2015-10-22 DIAGNOSIS — E785 Hyperlipidemia, unspecified: Secondary | ICD-10-CM

## 2015-10-22 DIAGNOSIS — E1169 Type 2 diabetes mellitus with other specified complication: Secondary | ICD-10-CM

## 2015-10-22 DIAGNOSIS — Z794 Long term (current) use of insulin: Secondary | ICD-10-CM

## 2015-10-22 DIAGNOSIS — G4700 Insomnia, unspecified: Secondary | ICD-10-CM

## 2015-10-22 DIAGNOSIS — I10 Essential (primary) hypertension: Secondary | ICD-10-CM

## 2015-10-22 DIAGNOSIS — M542 Cervicalgia: Secondary | ICD-10-CM

## 2015-10-22 DIAGNOSIS — E119 Type 2 diabetes mellitus without complications: Secondary | ICD-10-CM

## 2015-10-22 LAB — POCT GLYCOSYLATED HEMOGLOBIN (HGB A1C): HEMOGLOBIN A1C: 7.8

## 2015-10-22 MED ORDER — GLUCOSE BLOOD VI STRP: ORAL_STRIP | Status: DC

## 2015-10-22 MED ORDER — METFORMIN HCL 1000 MG PO TABS: ORAL_TABLET | ORAL | Status: DC

## 2015-10-22 MED ORDER — TRAMADOL HCL 50 MG PO TABS: 25.0000 mg | ORAL_TABLET | Freq: Three times a day (TID) | ORAL | Status: DC | PRN

## 2015-10-22 MED ORDER — INSULIN ASPART 100 UNIT/ML ~~LOC~~ SOLN: 2.0000 [IU] | Freq: Three times a day (TID) | SUBCUTANEOUS | Status: DC

## 2015-10-22 MED ORDER — ZOLPIDEM TARTRATE 5 MG PO TABS: 5.0000 mg | ORAL_TABLET | Freq: Every evening | ORAL | Status: DC | PRN

## 2015-10-22 NOTE — Assessment & Plan Note (Addendum)
Pretty stable but not controlled. Would aim for a goal of <7.5 with patient's age. Patient was without her Lantus for some time but daily blood sugars looked pretty good up until 3 weeks ago (when she stopped taking them). Will not adjust regimen during this visit. Will refill Novolog and test strips. Discussed checking blood sugars 4 times daily. No hypoglycemia

## 2015-10-22 NOTE — Assessment & Plan Note (Signed)
Bilateral spasms in trapezius muscles. Will refill Tramadol since this is working for patient. No red flags for radiculopathy

## 2015-10-22 NOTE — Telephone Encounter (Signed)
Handled during office visit

## 2015-10-22 NOTE — Assessment & Plan Note (Signed)
Appears to be doing well even without HCTZ 12.5mg . Lisinopril currently at a low dose as well. Discussed discontinuing HCTZ and sticking with just Lisinopril 10mg  daily. Will have her [facility] and call with results. If elevated, will increase Lisinopril to 20mg  daily. Will need to obtain BMET about two weeks after that to recheck kidney function.

## 2015-10-22 NOTE — Progress Notes (Signed)
    Subjective    [name] is a [age] y.o. female that presents for a follow-up visit for chronic issues.   1. Diabetes: Patient was previously without her Lantus for about one month because of an issue with getting it from the pharmacy. She had been using her Novolog 2 units with every meal daily. She recently started her Lantus 7u this morning. She has not checked her blood sugar in a few weeks because of running out of test strips. Looking at her logs, her fasting blood sugars generally run in the 70-130 range with most readings in the 90-100 range. No hypoglycemic symptoms of nausea, vomiting or confusion. She states that she saw an ophthalmologist this year and was told there was no diabetic neuropathy.  2. Hypertension: She is adherent with lisinopril 10mg . She has generally been adherent with HCTZ 12.5mg  daily but recently ran out.   3. Insomnia: This is a chronic issue. Ambien helps her sleep. She generally sleeps all night with this medication. No side effects.  4. Neck pain: this is a chronic issue. She is still having symptoms of pain that are intermittent. Tramadol helps with pain. Pain is sharp and radiates to her jaw. No weakness, numbness or tingling. She has had this evaluated and has been well treated with exercise and Tramadol.  Social History  Substance Use Topics  . Smoking status: Never Smoker   . Smokeless tobacco: Former NeurosurgeonUser    Types: Snuff    Quit date: 01/28/2013     Comment: Quit for 1.5 months in past. Current quit date 01/28/2013  . 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    ROS  Per HPI   Objective   BP 138/62 mmHg  Pulse 91  Temp(Src) 98.3 F (36.8 C) (Oral)  Ht 5\' 4"  (1.626 m)  Wt 145 lb (65.772 kg)  BMI 24.88 kg/m2  General: Well appearing, no distress Musculoskeletal: patient without midline cervical spine tenderness. Tenderness  along trapezius muscles bilaterally over shoulders. Foot exam: Please see flowsheet for diabetic foot exam Skin: ~0.5cm hyperpigmented lesion on plantar surface of left great toe  Assessment and Plan    Diabetes mellitus Pretty stable but not controlled. Would aim for a goal of <7.5 with patient's age. Patient was without her Lantus for some time but daily blood sugars looked pretty good up until 3 weeks ago (when she stopped taking them). Will not adjust regimen during this visit. Will refill Novolog and test strips. Discussed checking blood sugars 4 times daily. No hypoglycemia  Insomnia Stable on Ambien. Refill Ambien  HYPERTENSION, BENIGN SYSTEMIC Appears to be doing well even without HCTZ 12.5mg . Lisinopril currently at a low dose as well. Discussed discontinuing HCTZ and sticking with just Lisinopril 10mg  daily. Will have her reche[facility] blood [facility] and call with results. If elevated, will increase Lisinopril to 20mg  daily. Will need to obtain BMET about two weeks after that to recheck kidney function.  Cervical pain (neck) Bilateral spasms in trapezius muscles. Will refill Tramadol since this is working for patient. No red flags for radiculopathy

## 2015-10-22 NOTE — Assessment & Plan Note (Signed)
Stable on Ambien. Refill Ambien

## 2015-10-22 NOTE — Patient Instructions (Signed)
Thank you for coming to see me today. It was a pleasure. Today we talked about:   Hypertension: I am discontinuing your hydrochlorothiazide. Please call me later this month when you check your [facility] to see if we need to increase your lisinopril.  Diabetes: [name] is 7.8. I will not change your regimen but would like you to check your blood sugar 4 times per day; before breakfast, lunch, dinner and before bedtime.  Neck pain: I will refill your Tramadol  Insomnia: I will refill your Ambien  Please make an appointment to see me in 3 months for follow-up of diabetes and hypertension.  If you have any questions or concerns, please do not hesitate to call the office at [phone].  Sincerely,  Monica Hawkingalph Zaylei Mullane, MD

## 2015-10-26 MED ORDER — HYDROCHLOROTHIAZIDE 25 MG PO TABS: 12.5000 mg | ORAL_TABLET | Freq: Every day | ORAL | Status: DC

## 2015-10-26 NOTE — Telephone Encounter (Signed)
Refill approved. Sent to pharmacy. 

## 2015-11-02 ENCOUNTER — Other Ambulatory Visit: Payer: Self-pay | Admitting: *Deleted

## 2015-11-02 MED ORDER — MIRTAZAPINE 15 MG PO TABS: 7.5000 mg | ORAL_TABLET | Freq: Every day | ORAL | Status: DC

## 2015-11-16 ENCOUNTER — Encounter (HOSPITAL_COMMUNITY): Payer: Self-pay | Admitting: Emergency Medicine

## 2015-11-16 ENCOUNTER — Emergency Department (INDEPENDENT_AMBULATORY_CARE_PROVIDER_SITE_OTHER)
Admission: EM | Admit: 2015-11-16 | Discharge: 2015-11-16 | Disposition: A | Discharge status: Home / Self Care | Payer: No Typology Code available for payment source | Source: Home / Self Care | Attending: Family Medicine | Admitting: Family Medicine

## 2015-11-16 DIAGNOSIS — S29019A Strain of muscle and tendon of unspecified wall of thorax, initial encounter: Secondary | ICD-10-CM

## 2015-11-16 DIAGNOSIS — S2341XA Sprain of ribs, initial encounter: Secondary | ICD-10-CM

## 2015-11-16 LAB — POCT URINALYSIS DIP (DEVICE)
BILIRUBIN URINE: NEGATIVE
Glucose, UA: 100 mg/dL — AB
HGB URINE DIPSTICK: NEGATIVE
KETONES UR: NEGATIVE mg/dL
Leukocytes, UA: NEGATIVE
Nitrite: NEGATIVE
PH: 7 (ref 5.0–8.0)
Protein, ur: 30 mg/dL — AB
Specific Gravity, Urine: 1.02 (ref 1.005–1.030)
Urobilinogen, UA: 0.2 mg/dL (ref 0.0–1.0)

## 2015-11-16 NOTE — Discharge Instructions (Signed)
Muscle Strain/rib pain. Ice off and on.  Motrin or Aleve for pain. Limit activities that make the pain worse. For new symptoms or problems seek medical attention promptly. A muscle strain (pulled muscle) happens when a muscle is stretched beyond normal length. It happens when a sudden, violent force stretches your muscle too far. Usually, a few of the fibers in your muscle are torn. Muscle strain is common in athletes. Recovery usually takes 1-2 weeks. Complete healing takes 5-6 weeks.  HOME CARE   Follow the PRICE method of treatment to help your injury get better. Do this the first 2-3 days after the injury:  Protect. Protect the muscle to keep it from getting injured again.  Rest. Limit your activity and rest the injured body part.  Ice. Put ice in a plastic bag. Place a towel between your skin and the bag. Then, apply the ice and leave it on from 15-20 minutes each hour. After the third day, switch to moist heat packs.  Compression. Use a splint or elastic bandage on the injured area for comfort. Do not put it on too tightly.  Elevate. Keep the injured body part above the level of your heart.  Only take medicine as told by your doctor.  Warm up before doing exercise to prevent future muscle strains. GET HELP IF:   You have more pain or puffiness (swelling) in the injured area.  You feel numbness, tingling, or notice a loss of strength in the injured area. MAKE SURE YOU:   Understand these instructions.  Will watch your condition.  Will get help right away if you are not doing well or get worse.   This information is not intended to replace advice given to you by your health care provider. Make sure you discuss any questions you have with your health care provider.   Document Released: 08/09/2008 Document Revised: 08/21/2013 Document Reviewed: 05/30/2013 Elsevier Interactive Patient Education [employer]2016 Elsevier Inc.

## 2015-11-16 NOTE — ED Provider Notes (Signed)
CSN: [id number]     Arrival date & time 11/16/15  1321 History   First MD Initiated Contact with Patient 11/16/15 1606     Chief Complaint  Patient presents with  . Flank Pain   (Consider location/radiation/quality/duration/timing/severity/associated sxs/prior Treatment) HPI Comments: 59 year old female complaining of pain in the left low lateral abdomen/ribs that radiates to the left posterior axillary line. The primary pain is located in the mid axillary line. She states this started approximately one week ago. 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 She states that on Tuesday of last week after performing the janitorial services the pain began. She states it is better after she goes to bed but when she awakens in the morning to get her shower and perform her ADLs this increases the pain. She denies cough, shortness of breath, fever, abdominal pain or GU symptoms.  Patient is a 59 y.o. female presenting with flank pain.  Flank Pain Pertinent negatives include no chest pain, no abdominal pain and no shortness of breath.    Past Medical History  Diagnosis Date  . Diabetes mellitus   . Hyperlipidemia   . Hypertension   . Anemia   . Stroke (HCC)     20 08 - right arm/leg numbness.     . Benign heart murmur   . Gastric ulcer   . History of colonoscopy    Past Surgical History  Procedure Laterality Date  . Endometrial biopsy      Hx of endometrial Bx 2004 - negative for hyperplasia or malignancy. Patient does not remember reason for testing.  . Colonoscopy  01/13/2004    Colonoscopy - normal, diverticulosis   . Btl  1988  . L and r breast lumpectomies benign  1970  . Esophagogastroduodenoscopy      normal   Family History  Problem Relation Age of Onset  . Diabetes Mother   . Alcohol abuse Father   . Heart disease Father   . Diabetes Sister    Social History  Substance Use Topics  . Smoking status: Never Smoker   .  Smokeless tobacco: Former Neurosurgeon    Types: Snuff    Quit date: 01/28/2013     Comment: Quit for 1.5 months in past. Current quit date 01/28/2013  . 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 Negative for fever and fatigue.  HENT: Negative.   Respiratory: Negative.  Negative for cough, shortness of breath and wheezing.   Cardiovascular: Negative for chest pain, palpitations and leg swelling.  Gastrointestinal: Negative for nausea, vomiting, abdominal pain and blood in stool.  Genitourinary: Positive for flank pain. Negative for dysuria, frequency and pelvic pain.  Musculoskeletal: Negative for back pain and neck pain.  Skin: Negative.   Neurological: Negative.     Allergies  Review of patient's allergies indicates no known allergies.  Home Medications   Prior to Admission medications   Medication Sig Start Date End Date Taking? Authorizing Provider  aspirin 81 MG tablet Take 81 mg by mouth daily.    Rodolph Bong, MD  atorvastatin (LIPITOR) 40 MG tablet Take 1 tablet (40 mg total) by mouth daily. 09/08/15   Narda Bonds, MD  carbamide peroxide (DEBROX) 6.5 % otic solution Place 5 drops into both ears 2 (two) times daily  as needed. As needed for earwax buildup. 02/23/15   Stephanie Coup Street, MD  ferrous sulfate 325 (65 FE) MG tablet Take 1 tablet (325 mg total) by mouth 2 (two) times daily with a meal. 04/23/15   Stephanie Coup Street, MD  glucose blood test strip Freestyle Freedom Strips. Check sugars 4 times a day. 10/22/15   Narda Bonds, MD  hydrochlorothiazide (HYDRODIURIL) 25 MG tablet Take 0.5 tablets (12.5 mg total) by mouth daily. 10/26/15   Narda Bonds, MD  insulin aspart (NOVOLOG) 100 UNIT/ML injection Inject 2-4 Units into the skin 3 (three) times daily with meals. Inject 2-4 units per sliding scale after breakfast, lunch, and dinner. 10/22/15   Narda Bonds, MD  insulin glargine (LANTUS) 100 UNIT/ML injection Inject 0.07 mLs (7 Units total) into the skin daily. 09/17/15   Narda Bonds, MD  Insulin Syringe-Needle U-100 (B-D INS SYR ULTRAFINE .5CC/30G) 30G X 1/2" 0.5 ML MISC Inject 1 Syringe into the skin 4 (four) times daily - after meals and at bedtime. daily use of insulin 1 box - supply for one month 06/14/13   Lonia Skinner, MD  Lancets Fine 28G MISC Check sugars twice a day     Historical Provider, MD  lisinopril (PRINIVIL,ZESTRIL) 10 MG tablet Take 1 tablet (10 mg total) by mouth daily. 07/16/15   Narda Bonds, MD  meloxicam (MOBIC) 15 MG tablet Take 1 tablet (15 mg total) by mouth daily. 04/18/14   Lonia Skinner, MD  metFORMIN (GLUCOPHAGE) 1000 MG tablet TAKE 1 TABLET (1,000 MG TOTAL) BY MOUTH 2 (TWO) TIMES DAILY WITH A MEAL. 10/22/15   Narda Bonds, MD  mirtazapine (REMERON) 15 MG tablet Take 0.5 tablets (7.5 mg total) by mouth at bedtime. 11/02/15   Narda Bonds, MD  traMADol (ULTRAM) 50 MG tablet Take 0.5-1 tablets (25-50 mg total) by mouth 3 (three) times daily as needed. 10/22/15   Narda Bonds, MD  zolpidem (AMBIEN) 5 MG tablet Take 1 tablet (5 mg total) by mouth at bedtime as needed for sleep. 10/22/15   Narda Bonds, MD   Meds Ordered and Administered this Visit  Medications - No data to display  BP 160/95 mmHg  Pulse 90  Temp(Src) 97.7 F (36.5 C) (Oral)  Resp 18  SpO2 100% No data found.   Physical Exam  Constitutional: She is oriented to person, place, and time. She appears well-developed and well-nourished. No distress.  Eyes: EOM are normal.  Neck: Normal range of motion. Neck supple.  Cardiovascular: Normal rate, regular rhythm, normal heart sounds and intact distal pulses.   Pulmonary/Chest: Effort normal and breath sounds normal. No respiratory distress. She has no wheezes. She has no rales.  Abdominal: Soft. Bowel sounds are normal. She exhibits no distension and no mass. There is no tenderness. There  is no rebound and no guarding.  Musculoskeletal: Normal range of motion. She exhibits tenderness. She exhibits no edema.  Deep palpation of the left abdomen at the axillary line reveals tenderness along the ninth rib and associated musculature. There is no tenderness to the iliac crest or associated muscles. Palpation to the posterior axillary line reveals no tenderness. Having the patient lie supine and then sit up again reproduces the same pain for which she has had this week.  Neurological: She is alert and oriented to person, place, and time. She exhibits normal muscle tone.  Skin: Skin is warm and dry.  Psychiatric: She has a normal mood  and affect.  Nursing note and vitals reviewed.   ED Course  Procedures (including critical care time)  Labs Review Labs Reviewed  POCT URINALYSIS DIP (DEVICE) - Abnormal; Notable for the following:    Glucose, UA 100 (*)    Protein, ur 30 (*)    All other components within normal limits   Results for orders placed or performed during the hospital encounter of 11/16/15  POCT urinalysis dip (device)  Result Value Ref Range   Glucose, UA 100 (A) NEGATIVE mg/dL   Bilirubin Urine NEGATIVE NEGATIVE   Ketones, ur NEGATIVE NEGATIVE mg/dL   Specific Gravity, Urine 1.020 1.005 - 1.030   Hgb urine dipstick NEGATIVE NEGATIVE   pH 7.0 5.0 - 8.0   Protein, ur 30 (A) NEGATIVE mg/dL   Urobilinogen, UA 0.2 0.0 - 1.0 mg/dL   Nitrite NEGATIVE NEGATIVE   Leukocytes, UA NEGATIVE NEGATIVE     Imaging Review No results found.   Visual Acuity Review  Right Eye Distance:   Left Eye Distance:   Bilateral Distance:    Right Eye Near:   Left Eye Near:    Bilateral Near:         MDM   1. Sprain and strain of ribs, initial encounter     This is likely skeletal pain secondary to the patient's job in the janitorial position of having to clean, vacuum, mop and other activities that could call such pain. Her lungs are completely clear. She is afebrile  and showing no signs of infection. No urinary symptoms. No abdominal symptoms. The exam is otherwise normal. Urinalysis without blood, nitrites or leukocytes. She does have a small amount of blood sugar. She is a diabetic. She may take ibuprofen and Tylenol for pain and apply ice to the area. She is made aware that this may continue for several more days particularly as she continues to perform her usual activities at work. Muscle Strain/rib pain. Ice off and on.  Motrin or Aleve for pain. Limit activities that make the pain worse. For new symptoms or problems seek medical attention promptly.   Hayden Rasmussenavid Donovan Persley, NP 11/16/15 [phone]

## 2015-11-16 NOTE — ED Notes (Signed)
Left flank pain.  Reports pain for a week.  Patient's last bm was Saturday 12/31 that required a laxative.

## 2015-11-17 ENCOUNTER — Other Ambulatory Visit: Payer: Self-pay | Admitting: Family Medicine

## 2015-11-19 MED FILL — ZOLPIDEM TARTRATE 5 MG TAB: 5 | 30 days supply | Qty: 30 | Fill #1

## 2015-11-20 MED FILL — traMADol HCL 50 MG TABS: 50 | 30 days supply | Qty: 90 | Fill #1

## 2015-12-11 ENCOUNTER — Telehealth: Payer: Self-pay | Admitting: Family Medicine

## 2015-12-11 NOTE — Telephone Encounter (Signed)
Patient must be mistaken. This medication (as far as I'm aware) is still being produced. Is her issue that it is no longer effective? Please clarify

## 2015-12-11 NOTE — Telephone Encounter (Signed)
Pt says Remeron is not longer made. She would like to have something else similar called into [facility] pharmacy

## 2015-12-11 NOTE — Telephone Encounter (Signed)
Will forward to MD. Raneshia Derick,CMA  

## 2015-12-17 MED FILL — ZOLPIDEM TARTRATE 5 MG TAB: 5 | 30 days supply | Qty: 30 | Fill #2

## 2015-12-17 NOTE — Telephone Encounter (Signed)
Pt said she got a letter from company, [employer], that the remeron is no longer available thru the assistance program  Please advise

## 2015-12-21 NOTE — Telephone Encounter (Signed)
Looking back, this medication was used for appetite stimulation. At this point, will not use something in place of Remeron. If patient would like to discuss, she can follow-up with me as it has been about a year since she was evaluated for her weight loss.

## 2015-12-22 NOTE — Telephone Encounter (Signed)
Spoke to [name]. Gave her the info below, she will call and schedule an appointment to meet with Dr. Caleb Popp to discuss weight and meds. Sunday Spillers, CMA

## 2016-01-13 ENCOUNTER — Telehealth: Payer: Self-pay | Admitting: *Deleted

## 2016-01-13 DIAGNOSIS — Z8673 Personal history of transient ischemic attack (TIA), and cerebral infarction without residual deficits: Secondary | ICD-10-CM

## 2016-01-13 DIAGNOSIS — E785 Hyperlipidemia, unspecified: Principal | ICD-10-CM

## 2016-01-13 DIAGNOSIS — E1169 Type 2 diabetes mellitus with other specified complication: Secondary | ICD-10-CM

## 2016-01-13 MED ORDER — ATORVASTATIN CALCIUM 40 MG PO TABS: 40.0000 mg | ORAL_TABLET | Freq: Every day | ORAL | Status: DC

## 2016-01-13 NOTE — Telephone Encounter (Signed)
Dawn from [facility] called stating patient was there to pick up cholesterol medication.  [facility] does not carry any cholesterol medication, however they can give her 30 day supply Crestor samples until provider change to different medication or whenever they run out of samples. Patient can purchase with atorvastatin, simvastatin or pravastatin for $6 as long as she has the orange card. If changing to one of the three medications please fax to [facility] and not [facility].  Clovis Pu, RN

## 2016-01-13 NOTE — Telephone Encounter (Signed)
Atorvastatin 40 mg refilled. 

## 2016-01-19 ENCOUNTER — Encounter: Payer: Self-pay | Admitting: Family Medicine

## 2016-01-19 ENCOUNTER — Ambulatory Visit (INDEPENDENT_AMBULATORY_CARE_PROVIDER_SITE_OTHER): Payer: No Typology Code available for payment source | Admitting: Family Medicine

## 2016-01-19 VITALS — BP 136/82 | HR 79 | Temp 98.5°F | Ht 64.0 in | Wt 155.9 lb

## 2016-01-19 DIAGNOSIS — I10 Essential (primary) hypertension: Secondary | ICD-10-CM

## 2016-01-19 DIAGNOSIS — G4700 Insomnia, unspecified: Secondary | ICD-10-CM

## 2016-01-19 DIAGNOSIS — E119 Type 2 diabetes mellitus without complications: Secondary | ICD-10-CM

## 2016-01-19 DIAGNOSIS — M542 Cervicalgia: Secondary | ICD-10-CM

## 2016-01-19 DIAGNOSIS — Z794 Long term (current) use of insulin: Secondary | ICD-10-CM

## 2016-01-19 DIAGNOSIS — R21 Rash and other nonspecific skin eruption: Secondary | ICD-10-CM

## 2016-01-19 LAB — POCT GLYCOSYLATED HEMOGLOBIN (HGB A1C): HEMOGLOBIN A1C: 7.6

## 2016-01-19 MED ORDER — ZOLPIDEM TARTRATE 5 MG PO TABS: 5.0000 mg | ORAL_TABLET | Freq: Every evening | ORAL | Status: DC | PRN

## 2016-01-19 MED ORDER — CLOTRIMAZOLE-BETAMETHASONE 1-0.05 % EX CREA: 1.0000 "application " | TOPICAL_CREAM | Freq: Two times a day (BID) | CUTANEOUS | Status: DC

## 2016-01-19 MED ORDER — TRAMADOL HCL 50 MG PO TABS: 25.0000 mg | ORAL_TABLET | Freq: Three times a day (TID) | ORAL | Status: DC | PRN

## 2016-01-19 MED FILL — ZOLPIDEM TARTRATE 5 MG TAB: 5 | 30 days supply | Qty: 30 | Fill #0

## 2016-01-19 MED FILL — traMADol HCL 50 MG TABS: 50 | 30 days supply | Qty: 90 | Fill #0

## 2016-01-19 NOTE — Patient Instructions (Signed)
Thank you for coming to see me today. It was a pleasure. Today we talked about:   Diabetes: no changes to your regimen today.  High blood pressure: I am rechecking your blood pressure before you go. If elevated, we can increase you to lisinopril 20mg   Rash: I will prescribe a cream for you to use for two weeks  Please make an appointment to see me in 3 months.  If you have any questions or concerns, please do not hesitate to call the office at [phone].  Sincerely,  Jacquelin Hawkingalph Glena Pharris, MD

## 2016-01-19 NOTE — Progress Notes (Signed)
    Subjective    [name] is a 60 y.o. female that presents for a follow-up visit for:   1. Hypertension: Patient is adherent with lisinopril. She has stopped taking hctz. No chest pain or dyspnea. No side effects.  2. Diabetes: She is adherent with metformin 1000mg  twice daily, lantus 7u daily and novolog 2-4u three times daily meals. Her AM blood sugar ranges from 90 to 120s. She checks her blood sugar twice daily and has highs of 210s. She reports no hypoglycemia symptoms. She has no polyuria or polydipsia but some polyphagia with her Remeron.  3. Rash: Located on left leg and left antecubital fossa. She noticed the rashes about one month ago. No fevers, nausea or vomiting. Rash has worsened with aggravating it. No bleeding but it is very itchy. She has used alcohol on it, which has not helped.  Social History  Substance Use Topics  . Smoking status: Never Smoker   . Smokeless tobacco: Former NeurosurgeonUser    Types: Snuff    Quit date: 01/28/2013     Comment: Quit for 1.5 months in past. Current quit date 01/28/2013  . 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    ROS  Per HPI   Objective   BP 136/82 mmHg  Pulse 79  Temp(Src) 98.5 F (36.9 C) (Oral)  Ht 5\' 4"  (1.626 m)  Wt 155 lb 14.4 oz (70.716 kg)  BMI 26.75 kg/m2  Vital signs reviewed  General: Well appearing, no distress Skin: dry annular rash on antecubital fossa and on anterior left leg  Assessment and Plan    Diabetes mellitus Blood sugars appear well controlled from logs. A1C slightly improved to 7.6. No changes today. Focus on diet modification.  HYPERTENSION, BENIGN SYSTEMIC Controlled today. Asymptomatic. Currently not taking hydrochlorothiazide. No changes today.  Rash Appears fungal. Will prescribe clotrimazole-betamethasone cream.

## 2016-01-20 DIAGNOSIS — R21 Rash and other nonspecific skin eruption: Secondary | ICD-10-CM | POA: Insufficient documentation

## 2016-01-20 NOTE — Assessment & Plan Note (Signed)
Controlled today. Asymptomatic. Currently not taking hydrochlorothiazide. No changes today.

## 2016-01-20 NOTE — Assessment & Plan Note (Signed)
Appears fungal. Will prescribe clotrimazole-betamethasone cream.

## 2016-01-20 NOTE — Assessment & Plan Note (Signed)
Blood sugars appear well controlled from logs. A1C slightly improved to 7.6. No changes today. Focus on diet modification.

## 2016-02-11 MED FILL — ZOLPIDEM TARTRATE 5 MG TAB: 5 | 30 days supply | Qty: 30 | Fill #1

## 2016-02-15 MED FILL — traMADol HCL 50 MG TABS: 50 | 30 days supply | Qty: 90 | Fill #1

## 2016-03-10 MED FILL — ZOLPIDEM TARTRATE 5 MG TAB: 5 | 30 days supply | Qty: 30 | Fill #2

## 2016-03-11 ENCOUNTER — Other Ambulatory Visit: Payer: Self-pay

## 2016-03-11 DIAGNOSIS — Z1231 Encounter for screening mammogram for malignant neoplasm of breast: Secondary | ICD-10-CM

## 2016-03-25 ENCOUNTER — Ambulatory Visit
Admission: RE | Admit: 2016-03-25 | Discharge: 2016-03-25 | Disposition: A | Discharge status: Home / Self Care | Payer: No Typology Code available for payment source | Source: Ambulatory Visit

## 2016-03-25 DIAGNOSIS — Z1231 Encounter for screening mammogram for malignant neoplasm of breast: Secondary | ICD-10-CM

## 2016-04-12 ENCOUNTER — Other Ambulatory Visit: Payer: Self-pay | Admitting: *Deleted

## 2016-04-12 DIAGNOSIS — M542 Cervicalgia: Secondary | ICD-10-CM

## 2016-04-12 NOTE — Telephone Encounter (Signed)
Pt states that she is having pain in her side again and is requesting a refill on tramadol.  States she has appointment on 04/22/16, but would like not to have to wait that long. [name], Maryjo RochesterJessica Dawn, CMA

## 2016-04-18 ENCOUNTER — Telehealth: Payer: Self-pay | Admitting: Family Medicine

## 2016-04-18 NOTE — Telephone Encounter (Signed)
APT. REMINDER CALL, LMTCB °

## 2016-04-19 ENCOUNTER — Ambulatory Visit: Payer: No Typology Code available for payment source

## 2016-04-19 MED ORDER — TRAMADOL HCL 50 MG PO TABS: 25.0000 mg | ORAL_TABLET | Freq: Three times a day (TID) | ORAL | Status: DC | PRN

## 2016-04-19 NOTE — Telephone Encounter (Signed)
Refill authorized. Please call into pharmacy.

## 2016-04-20 NOTE — Telephone Encounter (Signed)
Contacted pt to verify pharmacy she wanted to have this sent to, called [facility] and called in RX. Lamonte SakaiZimmerman Rumple, April D, [place]

## 2016-04-21 MED FILL — traMADol HCL 50 MG TABS: 50 | 4 days supply | Qty: 12 | Fill #0

## 2016-04-22 ENCOUNTER — Encounter: Payer: Self-pay | Admitting: Family Medicine

## 2016-04-22 ENCOUNTER — Ambulatory Visit (INDEPENDENT_AMBULATORY_CARE_PROVIDER_SITE_OTHER): Payer: No Typology Code available for payment source | Admitting: Family Medicine

## 2016-04-22 VITALS — BP 149/80 | HR 86 | Temp 98.8°F | Ht 64.0 in | Wt 154.9 lb

## 2016-04-22 DIAGNOSIS — R519 Headache, unspecified: Secondary | ICD-10-CM

## 2016-04-22 DIAGNOSIS — Z794 Long term (current) use of insulin: Secondary | ICD-10-CM

## 2016-04-22 DIAGNOSIS — G4700 Insomnia, unspecified: Secondary | ICD-10-CM

## 2016-04-22 DIAGNOSIS — R51 Headache: Secondary | ICD-10-CM

## 2016-04-22 DIAGNOSIS — E119 Type 2 diabetes mellitus without complications: Secondary | ICD-10-CM

## 2016-04-22 DIAGNOSIS — I10 Essential (primary) hypertension: Secondary | ICD-10-CM

## 2016-04-22 LAB — POCT GLYCOSYLATED HEMOGLOBIN (HGB A1C): HEMOGLOBIN A1C: 7.5

## 2016-04-22 MED ORDER — METFORMIN HCL 1000 MG PO TABS: 1000.0000 mg | ORAL_TABLET | Freq: Two times a day (BID) | ORAL | Status: DC

## 2016-04-22 MED ORDER — LISINOPRIL 20 MG PO TABS: 20.0000 mg | ORAL_TABLET | Freq: Every day | ORAL | Status: DC

## 2016-04-22 MED ORDER — LISINOPRIL 10 MG PO TABS: 10.0000 mg | ORAL_TABLET | Freq: Every day | ORAL | Status: DC

## 2016-04-22 MED ORDER — ZOLPIDEM TARTRATE 5 MG PO TABS: 5.0000 mg | ORAL_TABLET | Freq: Every evening | ORAL | Status: DC | PRN

## 2016-04-22 MED FILL — ZOLPIDEM TARTRATE 5 MG TAB: 5 | 30 days supply | Qty: 30 | Fill #0

## 2016-04-22 NOTE — Patient Instructions (Signed)
Thank you for coming to see me today. It was a pleasure. Today we talked about:   Diabetes: Her A1c is 7.5 today. You're doing great with managing her diabetes. No changes to your regimen.  Hypertension: Your blood pressure was elevated today. I'm going to increase your lisinopril to lisinopril 20 mg daily. He will need to have your kidney function checked in about 4-6 weeks. Please remind her next doctor that your lisinopril was changed and that your kidney function needs to be monitored to make sure is not worsening.  Headache: I am unsure fortis causing this headache. At the time it does not appear dangerous. Please continue to use pain medication, which include Tylenol, to help with the pain. If symptoms worsen, or persist, please return for evaluation and possible further workup.  Please make an appointment to see your new doctor in 3 months and a lab visit in 4-6 weeks.  If you have any questions or concerns, please do not hesitate to call the office at [phone].  Sincerely,  Jacquelin Hawkingalph Nachmen Mansel, MD

## 2016-04-22 NOTE — Progress Notes (Signed)
    Subjective    [name] is a 60 y.o. female that presents for a follow-up visit for:   1. Diabetes: Patient has been adherent with metformin 1g twice daily, lantus 7 units daily and novolog 2-4 units three times daily with meals. No hypoglycemic symptoms. Fasting blood sugar ranges from 70s to 120s. She has one log blood sugar of 64   2. Headaches: Symptoms started about three weeks ago. Headaches located between her eyes over the bridge of her nose. She describes it as sharp and non-radiating. Headache lasts about 20 minutes. No associated vision changes, photophobia, phonophobia, nausea or vomiting. No history of migraines. She takes Tramadol 25mg  which helps. She has rhinorrhea throughout the day without fever, coughing or sneezing.  3. Insomnia: Chronic. Stable with Ambien. No side effects  4. Hypertension: Patient adherent with lisinopril 10 mg daily. No chest pain, dyspnea, leg swelling.  Social History  Substance Use Topics  . Smoking status: Never Smoker   . Smokeless tobacco: Former NeurosurgeonUser    Types: Snuff    Quit date: 01/28/2013     Comment: Quit for 1.5 months in past. Current quit date 01/28/2013  . 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    ROS  Per HPI   Objective   BP 149/80 mmHg  Pulse 86  Temp(Src) 98.8 F (37.1 C) (Oral)  Ht 5\' 4"  (1.626 m)  Wt 154 lb 14.4 oz (70.262 kg)  BMI 26.58 kg/m2  Vital signs reviewed  General: Well appearing, no distress HEENT:   Head:  Normocephalic  Eyes: Pupils equal and reactive to light/accomodation. Extraocular movements intact bilaterally.  Ears: Tympanic membranes normal bilaterally.  Nose/Throat: Nares patent bilaterally. Oropharnx clear and moist.  Neck: No cervical adenopathy bilaterally Musculoskeletal: Refer to diabetic foot exam  Assessment and Plan    Diabetes mellitus Patient's A1c slightly  improved but largely stable. Patient is doing well with her current regimen. Although she has one episode of hypoglycemia, she did not have any symptoms. I do not think we need to modify her regimen at this time. We'll continue Lantus 70 units daily in addition to NovoLog 2-4 units 3 times daily with meals. Also continue metformin 1000 mg twice daily.  Insomnia Stable. Refill Ambien.  HYPERTENSION, BENIGN SYSTEMIC Patient's blood pressure not controlled. Will increase lisinopril to lisinopril 20 mg daily. Will obtain a basic metabolic panel in 4-6 weeks to assess kidney function.  Acute nonintractable headache, unspecified headache type Headache is possibly tension. Does not appear related to sinuses. Does not correlate very well with migraine headache. At this time, patient headache is very acute. I related to trauma. Discussed possibility of association with hyper or hypoglycemia in addition to dehydration. For now, treat with Tylenol as needed. Advised to keep very well hydrated. If symptoms persist, will evaluate further. Red flags discussed.

## 2016-04-22 NOTE — Assessment & Plan Note (Addendum)
Stable. Refill Ambien.

## 2016-04-22 NOTE — Assessment & Plan Note (Signed)
Patient's blood pressure not controlled. Will increase lisinopril to lisinopril 20 mg daily. Will obtain a basic metabolic panel in 4-6 weeks to assess kidney function.

## 2016-04-22 NOTE — Assessment & Plan Note (Signed)
Patient's A1c slightly improved but largely stable. Patient is doing well with her current regimen. Although she has one episode of hypoglycemia, she did not have any symptoms. I do not think we need to modify her regimen at this time. We'll continue Lantus 70 units daily in addition to NovoLog 2-4 units 3 times daily with meals. Also continue metformin 1000 mg twice daily.

## 2016-05-06 ENCOUNTER — Ambulatory Visit (INDEPENDENT_AMBULATORY_CARE_PROVIDER_SITE_OTHER): Payer: No Typology Code available for payment source | Admitting: Family Medicine

## 2016-05-06 VITALS — BP 143/75 | HR 98 | Temp 98.3°F | Wt 156.8 lb

## 2016-05-06 DIAGNOSIS — M25561 Pain in right knee: Secondary | ICD-10-CM

## 2016-05-06 MED ORDER — MELOXICAM 15 MG PO TABS: 15.0000 mg | ORAL_TABLET | Freq: Every day | ORAL | Status: DC

## 2016-05-06 MED FILL — MELOXICAM 15 MG TABLET: 15 | 30 days supply | Qty: 30 | Fill #0

## 2016-05-06 NOTE — Progress Notes (Signed)
Subjective: CC: knee pain HPI: Patient is a 60 y.o. female with a past medical history of lower back pain, obesity presenting to clinic today for concerns of right knee pain.  On Monday she bent down on her R knee and noted tingling on the anterior knee.  She notes that on standing, the tingling sensation resolved. On Tuesday evening she started noting pain in her right knee, specifically the anterior aspect and on the medial and lateral aspects.  She states the pain felt like an achy sensation. She denies any grinding sensations. She denies any pain in the posterior aspect. She denies any popping or clicking sensation. She denies any feelings like her knee is went to give out.  Ambulating up and down steps makes it worse. She has no pain with ambulating on flat ground. There is no pain at rest.   She may have noticed some swelling but it is difficult for her to differentiate. She took some acetaminophen which did not help. No warmth of the knee. No recent trauma to the knee. She's never had pain like this before. She does not feel like her knee is unstable  Social History: Former tobacco use.   ROS: All other systems reviewed and are negative.  Past Medical History Patient Active Problem List   Diagnosis Date Noted  . Rash 01/20/2016  . Loss of weight 11/25/2014  . Acute periodontitis 06/16/2014  . Right knee pain 04/18/2014  . Fracture of fifth toe, left, closed 08/28/2013  . Right flank pain 08/17/2013  . Plantar wart of right foot 05/10/2013  . Low back pain 05/06/2013  . Diabetes mellitus (HCC) 10/15/2012  . Cervical pain (neck) 03/14/2012  . Insomnia 10/10/2011  . Hallux rigidus of right foot 08/29/2011  . CATARACTS 12/16/2009  . SMOKELESS TOBACCO ABUSE 05/14/2008  . OBESITY 11/30/2007  . DIVERTICULOSIS, COLON W/HEM 08/15/2007  . History of TIA (transient ischemic attack) 02/12/2007  . Hyperlipidemia associated with type 2 diabetes mellitus (HCC) 01/11/2007  . Iron  deficiency anemia 01/11/2007  . HYPERTENSION, BENIGN SYSTEMIC 01/11/2007    Medications- reviewed and updated Current Outpatient Prescriptions  Medication Sig Dispense Refill  . aspirin 81 MG tablet Take 81 mg by mouth daily.    Marland Kitchen. atorvastatin (LIPITOR) 40 MG tablet Take 1 tablet (40 mg total) by mouth daily. 90 tablet 3  . carbamide peroxide (DEBROX) 6.5 % otic solution Place 5 drops into both ears 2 (two) times daily as needed. As needed for earwax buildup. 15 mL 1  . clotrimazole-betamethasone (LOTRISONE) cream Apply 1 application topically 2 (two) times daily. Apply to rash 30 g 0  . ferrous sulfate 325 (65 FE) MG tablet Take 1 tablet (325 mg total) by mouth 2 (two) times daily with a meal. 60 tablet 3  . glucose blood test strip Freestyle Freedom Strips. Check sugars 4 times a day. 120 each 6  . insulin aspart (NOVOLOG) 100 UNIT/ML injection Inject 2-4 Units into the skin 3 (three) times daily with meals. Inject 2-4 units per sliding scale after breakfast, lunch, and dinner. 10 mL 6  . insulin glargine (LANTUS) 100 UNIT/ML injection Inject 0.07 mLs (7 Units total) into the skin daily. 10 mL 11  . Insulin Syringe-Needle U-100 (B-D INS SYR ULTRAFINE .5CC/30G) 30G X 1/2" 0.5 ML MISC Inject 1 Syringe into the skin 4 (four) times daily - after meals and at bedtime. daily use of insulin 1 box - supply for one month 120 each 6  . Lancets  Fine 28G MISC Check sugars twice a day     . lisinopril (PRINIVIL,ZESTRIL) 20 MG tablet Take 1 tablet (20 mg total) by mouth daily. 90 tablet 3  . meloxicam (MOBIC) 15 MG tablet Take 1 tablet (15 mg total) by mouth daily. 30 tablet 0  . metFORMIN (GLUCOPHAGE) 1000 MG tablet Take 1 tablet (1,000 mg total) by mouth 2 (two) times daily with a meal. 180 tablet 3  . traMADol (ULTRAM) 50 MG tablet Take 0.5-1 tablets (25-50 mg total) by mouth 3 (three) times daily as needed. 12 tablet 0  . zolpidem (AMBIEN) 5 MG tablet Take 1 tablet (5 mg total) by mouth at bedtime as  needed for sleep. 30 tablet 2   No current facility-administered medications for this visit.    Objective: Office vital signs reviewed. BP 143/75 mmHg  Pulse 98  Temp(Src) 98.3 F (36.8 C) (Oral)  Wt 156 lb 12.8 oz (71.124 kg)  SpO2 100%   Physical Examination:  General: Awake, alert, well- nourished, NAD Right knee: Mild effusion noted on the lateral aspect  No erythema, ecchymoses, or obvious bony abnormalities.  No obvious Baker's cysts Palpation normal with no warmth or joint line tenderness or patellar tenderness or condyle tenderness.  No TTP along infrapatellar or pes anserine bursas.   ROM normal in flexion (135 degrees) however endorses some tenderness with this and extension (0 degrees) and lower leg rotation. Ligaments with solid consistent endpoints including ACL, PCL, LCL, MCL.  Negative Mcmurray's and provocative meniscal tests including Thessaly testing  Non painful patellar compression.  Normal Patellar glide.  No apprehension  Patellar and quadriceps tendons unremarkable. Hamstring and quadriceps strength is normal. Sensation intact distally.    Assessment/Plan: Right knee pain The patient is presenting with knee pain for the last 4 days. Given the mechanism of action (onset after kneeling on her right knee), this is most likely a bursitis.  Besides mild effusion, her physical exam was unremarkable.  No point tenderness to suggest fracture. -Mobile 50 mg daily -Discussed rest, ice, elevation -Patient to follow-up if the pain does not improve or for symptoms begin to worsen.    No orders of the defined types were placed in this encounter.    Meds ordered this encounter  Medications  . meloxicam (MOBIC) 15 MG tablet    Sig: Take 1 tablet (15 mg total) by mouth daily.    Dispense:  30 tablet    Refill:  0    Joanna Puffrystal S. Dorsey PGY-2, [facility]

## 2016-05-06 NOTE — Assessment & Plan Note (Signed)
The patient is presenting with knee pain for the last 4 days. Given the mechanism of action (onset after kneeling on her right knee), this is most likely a bursitis.  Besides mild effusion, her physical exam was unremarkable.  No point tenderness to suggest fracture. -Mobile 50 mg daily -Discussed rest, ice, elevation -Patient to follow-up if the pain does not improve or for symptoms begin to worsen.

## 2016-05-06 NOTE — Patient Instructions (Signed)
Take Mobic daily for the pain and inflammation. Do not take Ibuprofen, Advil, Aspirin, or Naproxen while taking Mobic. Elevate your knee and ice it regularly. If your pain is not improving or if it worsening, follow up with your PCP.   Knee Pain Knee pain is a very common symptom and can have many causes. Knee pain often goes away when you follow your health care provider's instructions for relieving pain and discomfort at home. However, knee pain can develop into a condition that needs treatment. Some conditions may include:  Arthritis caused by wear and tear (osteoarthritis).  Arthritis caused by swelling and irritation (rheumatoid arthritis or gout).  A cyst or growth in your knee.  An infection in your knee joint.  An injury that will not heal.  Damage, swelling, or irritation of the tissues that support your knee (torn ligaments or tendinitis). If your knee pain continues, additional tests may be ordered to diagnose your condition. Tests may include X-rays or other imaging studies of your knee. You may also need to have fluid removed from your knee. Treatment for ongoing knee pain depends on the cause, but treatment may include:  Medicines to relieve pain or swelling.  Steroid injections in your knee.  Physical therapy.  Surgery. HOME CARE INSTRUCTIONS  Take medicines only as directed by your health care provider.  Rest your knee and keep it raised (elevated) while you are resting.  Do not do things that cause or worsen pain.  Avoid high-impact activities or exercises, such as running, jumping rope, or doing jumping jacks.  Apply ice to the knee area:  Put ice in a plastic bag.  Place a towel between your skin and the bag.  Leave the ice on for 20 minutes, 2-3 times a day.  Ask your health care provider if you should wear an elastic knee support.  Keep a pillow under your knee when you sleep.  Lose weight if you are overweight. Extra weight can put pressure on  your knee.  Do not use any tobacco products, including cigarettes, chewing tobacco, or electronic cigarettes. If you need help quitting, ask your health care provider. Smoking may slow the healing of any bone and joint problems that you may have. SEEK MEDICAL CARE IF:  Your knee pain continues, changes, or gets worse.  You have a fever along with knee pain.  Your knee buckles or locks up.  Your knee becomes more swollen. SEEK IMMEDIATE MEDICAL CARE IF:   Your knee joint feels hot to the touch.  You have chest pain or trouble breathing.   This information is not intended to replace advice given to you by your health care provider. Make sure you discuss any questions you have with your health care provider.   Document Released: 08/28/2007 Document Revised: 11/21/2014 Document Reviewed: 06/16/2014 Elsevier Interactive Patient Education [employer]2016 Elsevier Inc.

## 2016-05-20 MED FILL — ZOLPIDEM TARTRATE 5 MG TAB: 5 | 30 days supply | Qty: 30 | Fill #1

## 2016-05-26 ENCOUNTER — Ambulatory Visit: Payer: No Typology Code available for payment source | Admitting: Internal Medicine

## 2016-06-09 ENCOUNTER — Ambulatory Visit (INDEPENDENT_AMBULATORY_CARE_PROVIDER_SITE_OTHER): Payer: No Typology Code available for payment source | Admitting: Internal Medicine

## 2016-06-09 ENCOUNTER — Encounter: Payer: Self-pay | Admitting: Internal Medicine

## 2016-06-09 VITALS — BP 140/75 | HR 86 | Temp 98.1°F | Ht 64.0 in | Wt 159.2 lb

## 2016-06-09 DIAGNOSIS — E785 Hyperlipidemia, unspecified: Secondary | ICD-10-CM

## 2016-06-09 DIAGNOSIS — M542 Cervicalgia: Secondary | ICD-10-CM

## 2016-06-09 DIAGNOSIS — I10 Essential (primary) hypertension: Secondary | ICD-10-CM

## 2016-06-09 LAB — LIPID PANEL
Cholesterol: 139 mg/dL (ref 125–200)
HDL: 89 mg/dL (ref >=46)
LDL CALC: 35 mg/dL (ref <130)
Total CHOL/HDL Ratio: 1.6 Ratio (ref <=5.0)
Triglycerides: 77 mg/dL (ref <150)
VLDL: 15 mg/dL (ref <30)

## 2016-06-09 LAB — BASIC METABOLIC PANEL WITH GFR
BUN: 9 mg/dL (ref 7–25)
CHLORIDE: 100 mmol/L (ref 98–110)
CO2: 26 mmol/L (ref 20–31)
CREATININE: 1.04 mg/dL (ref 0.50–1.05)
Calcium: 9.3 mg/dL (ref 8.6–10.4)
GFR, Est African American: 68 mL/min (ref >=60)
GFR, Est Non African American: 59 mL/min — ABNORMAL LOW (ref >=60)
Glucose, Bld: 122 mg/dL — ABNORMAL HIGH (ref 65–99)
Potassium: 4.4 mmol/L (ref 3.5–5.3)
Sodium: 138 mmol/L (ref 135–146)

## 2016-06-09 MED ORDER — TRAMADOL HCL 50 MG PO TABS: 25.0000 mg | ORAL_TABLET | Freq: Three times a day (TID) | ORAL | 0 refills | Status: DC | PRN

## 2016-06-09 MED ORDER — ROSUVASTATIN CALCIUM 20 MG PO TABS: 20.0000 mg | ORAL_TABLET | Freq: Every day | ORAL | 3 refills | Status: DC

## 2016-06-09 NOTE — Progress Notes (Signed)
Subjective:    [name] - 60 y.o. female MRN [id number]  Date of birth: 1956/07/12  HPI  NASIR STOLTMAN is here for annual physical exam.  Constipation: Reports that she normally has BMs twice per day. Every 2 weeks or so she has difficulty with having a bowel movement. Feels like stools are hard and she has to strain to have BM. Denies hematochezia. Drinks small amounts of prune juice and this relives her constipation. No associated abdominal pain with constipation.  Vaginal Dryness: present with intercourse. Reports local irritation/friction to the vagina during sexual intercourse. No longer having menstrual periods. Denies abnormal uterine bleeding or vaginal discharge. Has not tried anything to help with dryness.   Patient reports no  vision/ hearing changes,anorexia, weight change, fever ,adenopathy, persistant / recurrent hoarseness, swallowing issues, chest pain, edema,persistant / recurrent cough, hemoptysis, dyspnea(rest, exertional, paroxysmal nocturnal), gastrointestinal  bleeding (melena, rectal bleeding), abdominal pain, excessive heart burn, GU symptoms(dysuria, hematuria, pyuria, voiding/incontinence  Issues) syncope, focal weakness, severe memory loss, concerning skin lesions, depression, anxiety, abnormal bruising/bleeding, major joint swelling, breast masses or abnormal vaginal bleeding.    Health Maintenance:  Health Maintenance Due  Topic Date Due  . FOOT EXAM  07/31/2015    -  reports that she has never smoked. She quit smokeless tobacco use about 3 years ago. Her smokeless tobacco use included Snuff. - Past Medical History: Patient Active Problem List   Diagnosis Date Noted  . Rash 01/20/2016  . Loss of weight 11/25/2014  . Acute periodontitis 06/16/2014  . Right knee pain 04/18/2014  . Fracture of fifth toe, left, closed 08/28/2013  . Right flank pain 08/17/2013  . Plantar wart of right foot 05/10/2013  . Low back pain 05/06/2013  . Diabetes mellitus  (HCC) 10/15/2012  . Cervical pain (neck) 03/14/2012  . Insomnia 10/10/2011  . Hallux rigidus of right foot 08/29/2011  . CATARACTS 12/16/2009  . SMOKELESS TOBACCO ABUSE 05/14/2008  . OBESITY 11/30/2007  . DIVERTICULOSIS, COLON W/HEM 08/15/2007  . History of TIA (transient ischemic attack) 02/12/2007  . Hyperlipidemia associated with type 2 diabetes mellitus (HCC) 01/11/2007  . Iron deficiency anemia 01/11/2007  . HYPERTENSION, BENIGN SYSTEMIC 01/11/2007      Objective:   Physical Exam BP 140/75 (BP Location: Right Arm, Patient Position: Sitting, Cuff Size: Normal)   Pulse 86   Temp 98.1 F (36.7 C) (Oral)   Ht 5\' 4"  (1.626 m)   Wt 159 lb 3.2 oz (72.2 kg)   BMI 27.33 kg/m  Gen: NAD, alert, cooperative with exam, well-appearing HEENT: NCAT, PERRL, clear conjunctiva, oropharynx clear, supple neck CV: RRR, good S1/S2, no murmur, no edema, capillary refill brisk  Resp: CTABL, no wheezes, non-labored Abd: SNTND, BS present, no guarding or organomegaly Skin: no rashes, normal turgor  Neuro: no gross deficits.  Psych: good insight, alert and oriented  Diabetic Foot Check -  Appearance - no lesions, ulcers or calluses Skin - no unusual pallor or redness Monofilament testing - normal bilaterally  Right - Great toe, medial, central, lateral ball and posterior foot intact Left - Great toe, medial, central, lateral ball and posterior foot intact      Assessment & Plan:   1. Well visit. Diabetic foot check performed today as it was the only overdue health maintenance. Discussed preventative measures for age.   2. Constipation: Sounds very benign. Patient UTD on colonoscopy (will need repeat in 2018). Recommended daily fiber supplements. Can continue prune juice prn as this appears  to give patient good relief.   3. Vaginal Dryness: Likely related to aging. Recommended OTC lubricants with sexual intercourse. Will f/u if no improvement.   4. BMET and lipid panel ordered today.     Marcy Siren, D.O. 06/09/2016, 10:35 AM PGY-2, [facility];

## 2016-06-09 NOTE — Patient Instructions (Signed)
Thank you for coming in today! I will follow up with you regarding your lab results.   Try fiber supplements for constipation.   Try over the counter lubricants with sexual intercourse.

## 2016-06-10 ENCOUNTER — Other Ambulatory Visit: Payer: Self-pay | Admitting: *Deleted

## 2016-06-12 MED ORDER — INSULIN ASPART 100 UNIT/ML ~~LOC~~ SOLN: 2.0000 [IU] | Freq: Three times a day (TID) | SUBCUTANEOUS | 6 refills | Status: DC

## 2016-06-14 MED FILL — traMADol HCL 50 MG TABS: 50 | 4 days supply | Qty: 12 | Fill #0

## 2016-06-17 MED FILL — ZOLPIDEM TARTRATE 5 MG TAB: 5 | 30 days supply | Qty: 30 | Fill #2

## 2016-07-05 ENCOUNTER — Ambulatory Visit (INDEPENDENT_AMBULATORY_CARE_PROVIDER_SITE_OTHER): Payer: No Typology Code available for payment source | Admitting: Internal Medicine

## 2016-07-05 ENCOUNTER — Encounter: Payer: Self-pay | Admitting: Internal Medicine

## 2016-07-05 VITALS — BP 175/78 | HR 79 | Temp 98.9°F | Wt 158.6 lb

## 2016-07-05 DIAGNOSIS — M25461 Effusion, right knee: Secondary | ICD-10-CM

## 2016-07-05 DIAGNOSIS — M25561 Pain in right knee: Secondary | ICD-10-CM

## 2016-07-05 MED ORDER — MELOXICAM 15 MG PO TABS: 15.0000 mg | ORAL_TABLET | Freq: Every day | ORAL | 0 refills | Status: DC

## 2016-07-05 MED ORDER — TRAMADOL HCL 50 MG PO TABS: 50.0000 mg | ORAL_TABLET | Freq: Three times a day (TID) | ORAL | 0 refills | Status: DC | PRN

## 2016-07-05 MED FILL — traMADol HCL 50 MG TABS: 50 | 2 days supply | Qty: 5 | Fill #0

## 2016-07-05 MED FILL — MELOXICAM 15 MG TABLET: 15 | 30 days supply | Qty: 30 | Fill #0

## 2016-07-05 NOTE — Progress Notes (Signed)
[facility] Progress Note  Subjective:  [name] is a 60-y/o female who presents for right knee swelling.  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 - Has not taken anything for pain. Reports improvement in knee pain in the past with tramadol.  - Says had episode when she was out walking 2 weeks ago when R knee seemed to pop/give but did not fall - Does not have history of gout ROS: No fevers, no decreased appetite  No Known Allergies  Objective: Blood pressure (!) 175/78, pulse 79, temperature 98.9 F (37.2 C), temperature source Oral, weight 158 lb 9.6 oz (71.9 kg). In the setting of discomfort.  Constitutional: Well-appearing female, in NAD Musculoskeletal: Swelling of R lateral aspect of knee. R-sided painful patellar compression and R lateral joint line tenderness. Negative McMurray's, negative drawer testing, negative Lachman's, negative varus and valgus testing bilaterally. LE strength intact bilaterally. No patellar laxity.  Skin: No erythema or increased warmth of R knee vs L.  Psychiatric: Normal mood and affect.  Vitals reviewed  Assessment/Plan: Pain and swelling of right knee - Suspect swelling secondary to chronic arthritis given painful patellar compression test - Prescribed mobic. Gave tramadol (#5 pills) at patient request but advised her to try mobic, rest, elevation and ice first. - Offered steroid injection shot, but patient preferred to try PO medication and rest first.  - Provided work note for patient to resume duties no earlier than 8/26  Follow-up if pain does not improve.  Dani GobbleHillary Fitzgerald, MD [facility],  PGY-2

## 2016-07-05 NOTE — Patient Instructions (Signed)
[name],  Your knee swelling is likely due to overuse in setting of arthritis. Please rest for the next few days, elevate leg as possible, and use ice for swelling. Take meloxicam 15 mg daily for the next week. Use tramadol only for severe pain after trying meloxicam.  If pain/swelling continues, please call clinic to schedule a steroid injection.  Best,  Dr. Sampson GoonFitzgerald

## 2016-07-06 DIAGNOSIS — M25561 Pain in right knee: Secondary | ICD-10-CM

## 2016-07-06 DIAGNOSIS — M25461 Effusion, right knee: Secondary | ICD-10-CM | POA: Insufficient documentation

## 2016-07-06 NOTE — Assessment & Plan Note (Addendum)
-   Suspect swelling secondary to chronic arthritis given painful patellar compression test - Prescribed mobic. Gave tramadol (#5 pills) at patient request but advised her to try mobic, rest, elevation and ice first. - Offered steroid injection shot, but patient preferred to try PO medication and rest first.  - Provided work note for patient to resume duties no earlier than 8/26

## 2016-07-19 ENCOUNTER — Other Ambulatory Visit: Payer: Self-pay | Admitting: Internal Medicine

## 2016-07-19 DIAGNOSIS — G4700 Insomnia, unspecified: Secondary | ICD-10-CM

## 2016-07-19 DIAGNOSIS — M25561 Pain in right knee: Secondary | ICD-10-CM

## 2016-07-19 NOTE — Telephone Encounter (Signed)
Refill request for Ambien and Tramadol.

## 2016-07-21 ENCOUNTER — Other Ambulatory Visit: Payer: Self-pay | Admitting: Internal Medicine

## 2016-07-21 DIAGNOSIS — M25561 Pain in right knee: Secondary | ICD-10-CM

## 2016-07-21 DIAGNOSIS — G4700 Insomnia, unspecified: Secondary | ICD-10-CM

## 2016-07-21 NOTE — Telephone Encounter (Signed)
Pt called again wanting to know her medication would be refilled. Please advise. Thanks! ep

## 2016-07-22 MED ORDER — TRAMADOL HCL 50 MG PO TABS: 50.0000 mg | ORAL_TABLET | Freq: Three times a day (TID) | ORAL | 0 refills | Status: DC | PRN

## 2016-07-22 MED ORDER — ZOLPIDEM TARTRATE 5 MG PO TABS: 5.0000 mg | ORAL_TABLET | Freq: Every evening | ORAL | 2 refills | Status: DC | PRN

## 2016-07-22 MED FILL — ZOLPIDEM TARTRATE 5 MG TAB: 5 | 30 days supply | Qty: 30 | Fill #0

## 2016-07-22 MED FILL — traMADol HCL 50 MG TABS: 50 | 2 days supply | Qty: 5 | Fill #0

## 2016-07-22 NOTE — Telephone Encounter (Signed)
Refills printed and placed up front for patient to pick up. Please call and let patient know.

## 2016-07-22 NOTE — Telephone Encounter (Signed)
Confirmed with [facility] they do not carry these and then [facility], routing to PCP as an FYI. Lamonte SakaiZimmerman Rumple, Carlesha Seiple D, [place]

## 2016-07-22 NOTE — Telephone Encounter (Signed)
Ambien and Tramadol are not covered by [facility]. [facility]. Please let pt know when this is done so she can go pick them up

## 2016-07-22 NOTE — Addendum Note (Signed)
Addended by: De HollingsheadWALLACE, Suvan Stcyr L on: 07/22/2016 01:47 PM   Modules accepted: Orders

## 2016-07-25 NOTE — Telephone Encounter (Signed)
Contacted pt to let her know that her Rx's are up front for pick up per Dr. Earlene PlaterWallace note in previous message. Lamonte SakaiZimmerman Rumple, April D, [place]

## 2016-07-25 NOTE — Telephone Encounter (Signed)
Pt informed that Rx's are up front for pick up. [name], [name], [place]

## 2016-08-08 ENCOUNTER — Telehealth: Payer: Self-pay | Admitting: *Deleted

## 2016-08-08 NOTE — Telephone Encounter (Signed)
Received fax from [facility] stating Crestor is no longer available for free.  Please consider writing for Livalo.  Call with questions at [phone].  Clovis PuMartin, Tamika L, RN

## 2016-08-22 MED FILL — ZOLPIDEM TARTRATE 5 MG TAB: 5 | 30 days supply | Qty: 30 | Fill #1

## 2016-09-06 ENCOUNTER — Encounter: Payer: Self-pay | Admitting: Internal Medicine

## 2016-09-06 ENCOUNTER — Ambulatory Visit (INDEPENDENT_AMBULATORY_CARE_PROVIDER_SITE_OTHER): Payer: No Typology Code available for payment source | Admitting: Internal Medicine

## 2016-09-06 VITALS — BP 172/80 | HR 88 | Temp 98.7°F | Ht 64.0 in | Wt 168.0 lb

## 2016-09-06 DIAGNOSIS — E1169 Type 2 diabetes mellitus with other specified complication: Secondary | ICD-10-CM

## 2016-09-06 DIAGNOSIS — Z794 Long term (current) use of insulin: Secondary | ICD-10-CM

## 2016-09-06 DIAGNOSIS — E119 Type 2 diabetes mellitus without complications: Secondary | ICD-10-CM

## 2016-09-06 DIAGNOSIS — E785 Hyperlipidemia, unspecified: Secondary | ICD-10-CM

## 2016-09-06 DIAGNOSIS — I10 Essential (primary) hypertension: Secondary | ICD-10-CM

## 2016-09-06 LAB — POCT GLYCOSYLATED HEMOGLOBIN (HGB A1C): HEMOGLOBIN A1C: 8.6

## 2016-09-06 MED ORDER — ATORVASTATIN CALCIUM 40 MG PO TABS: 40.0000 mg | ORAL_TABLET | Freq: Every day | ORAL | 1 refills | Status: DC

## 2016-09-06 MED ORDER — TRAMADOL HCL 50 MG PO TABS: 50.0000 mg | ORAL_TABLET | Freq: Three times a day (TID) | ORAL | 0 refills | Status: DC | PRN

## 2016-09-06 MED ORDER — HYDROCHLOROTHIAZIDE 12.5 MG PO TABS: 12.5000 mg | ORAL_TABLET | Freq: Every day | ORAL | 0 refills | Status: DC

## 2016-09-06 MED FILL — traMADol HCL 50 MG TABS: 50 | 4 days supply | Qty: 10 | Fill #0

## 2016-09-06 NOTE — Assessment & Plan Note (Signed)
Uncontrolled today and not at goal of <140/90. No red flags.  -continue Lisinopril  -add HCTZ 12.5 mg daily  -return in 2 weeks for f/u -will obtain BMET at follow up

## 2016-09-06 NOTE — Progress Notes (Signed)
Subjective:    [name] - 60 y.o. female MRN [id number]  Date of birth: 1956/02/28  HPI  [name] is here for follow up of chronic medical conditions.  Diabetes mellitus, Type 2 Disease Monitoring Blood Sugar Ranges: Fasting - <130; Random - 140-190. Polyuria: No  Visual problems: no   Urine Microalbumin One AceInhibitor therapy   Last A1C: 7.5 (June 2017)   Medication Compliance: yes; Takes Lantus 7u in AM and Novolog 2-4 units TID with meals (unable to tell me how she determines sliding scale)  Medication Side Effects Hypoglycemia: yes, one episode of hypoglycemia in past month with CBG of 79.    Preventitive Health Care Eye Exam: Needs eye exam Foot Exam: UTD  Hyperlipidemia: Has been unable to fill Crestor 20 mg at [facility] due to it not being on formulary.   HTN: Does not monitor BP at home. Takes Lisinopril 20 mg daily. Denies chest pain, lower extremity edema, headaches, and SOB.    Health Maintenance:  Health Maintenance Due  Topic Date Due  . INFLUENZA VACCINE  06/14/2016  . PAP SMEAR  08/16/2016    -  reports that she has never smoked. She quit smokeless tobacco use about 3 years ago. Her smokeless tobacco use included Snuff. - Review of Systems: Per HPI. - Past Medical History: Patient Active Problem List   Diagnosis Date Noted  . Pain and swelling of right knee 07/06/2016  . Rash 01/20/2016  . Loss of weight 11/25/2014  . Acute periodontitis 06/16/2014  . Fracture of fifth toe, left, closed 08/28/2013  . Plantar wart of right foot 05/10/2013  . Low back pain 05/06/2013  . Diabetes mellitus (HCC) 10/15/2012  . Cervical pain (neck) 03/14/2012  . Insomnia 10/10/2011  . Hallux rigidus of right foot 08/29/2011  . CATARACTS 12/16/2009  . SMOKELESS TOBACCO ABUSE 05/14/2008  . OBESITY 11/30/2007  . DIVERTICULOSIS, COLON W/HEM 08/15/2007  . History of TIA (transient ischemic  attack) 02/12/2007  . Hyperlipidemia associated with type 2 diabetes mellitus (HCC) 01/11/2007  . Iron deficiency anemia 01/11/2007  . HYPERTENSION, BENIGN SYSTEMIC 01/11/2007   - Medications: reviewed and updated    Objective:   Physical Exam BP (!) 172/80   Pulse 88   Temp 98.7 F (37.1 C) (Oral)   Ht 5\' 4"  (1.626 m)   Wt 168 lb (76.2 kg)   BMI 28.84 kg/m  Gen: NAD, alert, cooperative with exam, well-appearing CV: RRR, good S1/S2, no murmur, no edema, capillary refill brisk  Resp: CTABL, no wheezes, non-labored     Assessment & Plan:   Hyperlipidemia associated with type 2 diabetes mellitus ASVCD risk score calculated to be about 17%. Patient previously on Crestor 20 mg daily but no longer free at [facility].  -had been planning on switching to Lipitor 40 mg daily but patient informed me that she could not take that medication  -will discuss with pharmacy next visit and will likely need to prescribe max dose of a moderate intensity statin  -continue ASA 81 mg daily   HYPERTENSION, BENIGN SYSTEMIC Uncontrolled today and not at goal of <140/90. No red flags.  -continue Lisinopril  -add HCTZ 12.5 mg daily  -return in 2 weeks for f/u -will obtain BMET at follow up   Diabetes mellitus Worsening of A1C since last visit. However, patient has very well controlled fasting CBGs and one episode of recent hypoglycemia.  -continue Lantus 7u daily  -change to Novolog 3 units TID and stop sliding scale  as patient can't tell me her parameters -continue Metformin 1000 mg twice daily     [name], D.O. 09/06/2016, 3:22 PM PGY-2, [facility]

## 2016-09-06 NOTE — Patient Instructions (Signed)
Please return in 2 weeks for follow up of your blood pressure.   I have prescribed HCTZ as another blood pressure medication.   I have also prescribed Lipitor which is a statin. Please take that daily.   To simplify your insulin regimen take 3 units of Novolog daily with meals.

## 2016-09-06 NOTE — Assessment & Plan Note (Signed)
Worsening of A1C since last visit. However, patient has very well controlled fasting CBGs and one episode of recent hypoglycemia.  -continue Lantus 7u daily  -change to Novolog 3 units TID and stop sliding scale as patient can't tell me her parameters -continue Metformin 1000 mg twice daily

## 2016-09-06 NOTE — Assessment & Plan Note (Addendum)
ASVCD risk score calculated to be about 17%. Patient previously on Crestor 20 mg daily but no longer free at [facility].  -had been planning on switching to Lipitor 40 mg daily but patient informed me that she could not take that medication  -will discuss with pharmacy next visit and will likely need to prescribe max dose of a moderate intensity statin  -continue ASA 81 mg daily

## 2016-09-13 ENCOUNTER — Telehealth: Payer: Self-pay | Admitting: Internal Medicine

## 2016-09-13 NOTE — Telephone Encounter (Signed)
Pt stated she had to get the capsules this time for her BP medication. Pt states the capsules are making her sick and feel funny. Pt quit taking the medication yesterday. Please advise. Thanks! ep

## 2016-09-13 NOTE — Telephone Encounter (Signed)
Will forward to PCP.  Reeves Musick L, RN  

## 2016-09-13 NOTE — Telephone Encounter (Signed)
Pt informed. Had good understanding. Sunday SpillersSharon T Janiyla Long, CMA

## 2016-09-13 NOTE — Telephone Encounter (Signed)
Usually a change in medication form (tablets to capsules) wouldn't cause someone to feel nauseous. I think she did the right thing stopping the medication. She should hold off on the medication for one week. Then if she is feeling better, she should start taking the medication again in one week to see if she has the same symptoms. Please have her keep me updated.   [name], D.O. 09/13/2016, 4:44 PM PGY-2, [facility]

## 2016-09-16 ENCOUNTER — Telehealth: Payer: Self-pay | Admitting: *Deleted

## 2016-09-16 NOTE — Telephone Encounter (Signed)
Received fax from [facility] crestor is no longer available for free-would you consider Livalo.  If so please fax new hardscript to [facility] program. Fleeger, Jessica Dawn, CMA  

## 2016-09-22 ENCOUNTER — Other Ambulatory Visit: Payer: Self-pay | Admitting: Internal Medicine

## 2016-09-22 NOTE — Telephone Encounter (Signed)
Needs refill on tramadol.  Pt has appt tomorrow for BP check and would like to get it them

## 2016-09-23 ENCOUNTER — Ambulatory Visit (INDEPENDENT_AMBULATORY_CARE_PROVIDER_SITE_OTHER): Payer: No Typology Code available for payment source | Admitting: *Deleted

## 2016-09-23 VITALS — BP 154/76 | HR 96

## 2016-09-23 DIAGNOSIS — Z0130 Encounter for examination of blood pressure without abnormal findings: Secondary | ICD-10-CM

## 2016-09-23 DIAGNOSIS — I10 Essential (primary) hypertension: Secondary | ICD-10-CM

## 2016-09-23 MED ORDER — TRAMADOL HCL 50 MG PO TABS: 50.0000 mg | ORAL_TABLET | Freq: Three times a day (TID) | ORAL | 0 refills | Status: DC | PRN

## 2016-09-23 MED FILL — traMADol HCL 50 MG TABS: 50 | 4 days supply | Qty: 10 | Fill #0

## 2016-09-23 MED FILL — ZOLPIDEM TARTRATE 5 MG TAB: 5 | 30 days supply | Qty: 30 | Fill #2

## 2016-09-23 NOTE — Progress Notes (Signed)
   Patient in nurse clinic for blood pressure check.  Patient denies chest pain, SOB, dizziness, headache and visual changes.  Patient reported that the HCTZ 12.5 mg capsule are making her sick.  She complained of bone aches, n/v, and abdominal cramps.  Patient stated she stopped the capsules for one week and restarted them.  Last capsule she took was yesterday.  She is requesting to switch to tablet form.  She was on tablets in the past and they worked better for her. Patient is taking the Lisinopril as prescribed.  She is requesting a refill on Lisinopril as well to be sent to [facility] with the HCTZ tablets.  Clovis PuMartin, Tamika L, RN  Today's Vitals   09/23/16 1102 09/23/16 1108  BP: (!) 160/80 (!) 154/76  Pulse: 96   SpO2: 96%   PainSc: 0-No pain

## 2016-09-23 NOTE — Telephone Encounter (Signed)
Phoned in prescription to [facility]. It should be available for pick up soon. Please call patient and let her know.   [name], D.O. 09/23/2016, 1:38 PM PGY-2, [facility]

## 2016-09-30 NOTE — Telephone Encounter (Signed)
Called pt to let her know about her prescription. Pt stated she had some jaw pain, scheduled appointment for her on 11/20 at 4pm . Maryjean Mornempestt S Roberts, CMA

## 2016-10-03 ENCOUNTER — Ambulatory Visit: Payer: No Typology Code available for payment source | Admitting: Internal Medicine

## 2016-10-10 ENCOUNTER — Other Ambulatory Visit: Payer: Self-pay | Admitting: *Deleted

## 2016-10-10 MED ORDER — ROSUVASTATIN CALCIUM 10 MG PO TABS: 10.0000 mg | ORAL_TABLET | Freq: Every day | ORAL | 5 refills | Status: DC

## 2016-10-12 ENCOUNTER — Other Ambulatory Visit: Payer: Self-pay | Admitting: Internal Medicine

## 2016-10-12 NOTE — Telephone Encounter (Signed)
Pt would like a refill on tramadol for neck pain. Please advise. Thanks! ep

## 2016-10-12 NOTE — Telephone Encounter (Signed)
Was this addressed?

## 2016-10-12 NOTE — Telephone Encounter (Signed)
Patient originally prescribed Tramadol as short term course for knee pain. Patient now requesting medication for neck pain which she has not been evaluated for recently. Therefore, this refill is not appropriate. If patient is requesting pain medication for her neck she needs an appointment for further evaluation.   [name], D.O. 10/12/2016, 3:02 PM PGY-2, [facility]

## 2016-10-13 ENCOUNTER — Telehealth: Payer: Self-pay | Admitting: Internal Medicine

## 2016-10-13 NOTE — Telephone Encounter (Signed)
Pt is calling to check the status of her refill request on Tramadol. Please let her know. jw

## 2016-10-14 NOTE — Telephone Encounter (Signed)
I left note in chart that I will not refill Tramadol for neck pain as it was originally prescribed for acute knee pain. If neck is causing pain she needs office visit for evaluation.   Marcy Sirenatherine Vartan Kerins, D.O. 10/14/2016, 9:56 AM PGY-2, [facility]

## 2016-10-14 NOTE — Telephone Encounter (Signed)
Pt was advised. ep °

## 2016-10-31 ENCOUNTER — Other Ambulatory Visit: Payer: Self-pay | Admitting: *Deleted

## 2016-10-31 DIAGNOSIS — G4700 Insomnia, unspecified: Secondary | ICD-10-CM

## 2016-11-02 MED ORDER — INSULIN GLARGINE 100 UNIT/ML ~~LOC~~ SOLN: 7.0000 [IU] | Freq: Every day | SUBCUTANEOUS | 11 refills | Status: DC

## 2016-11-02 MED ORDER — ZOLPIDEM TARTRATE 5 MG PO TABS
5.0000 mg | ORAL_TABLET | Freq: Every evening | ORAL | 2 refills | Status: DC | PRN
Start: 2016-11-02 — End: 2017-02-15

## 2016-11-02 NOTE — Telephone Encounter (Signed)
I have printed both of her prescription requests and left them at the front desk for pick up.   [name], D.O. 11/02/2016, 8:47 AM PGY-2, [facility]

## 2016-11-04 NOTE — Telephone Encounter (Signed)
Pt informed. Deseree Blount, CMA  

## 2016-11-09 ENCOUNTER — Other Ambulatory Visit: Payer: Self-pay | Admitting: Internal Medicine

## 2016-11-09 ENCOUNTER — Other Ambulatory Visit: Payer: Self-pay | Admitting: *Deleted

## 2016-11-09 MED ORDER — "INSULIN SYRINGE-NEEDLE U-100 30G X 1/2"" 0.5 ML MISC": 1.0000 | Freq: Three times a day (TID) | 5 refills | Status: DC

## 2016-11-09 MED ORDER — GLUCOSE BLOOD VI STRP: ORAL_STRIP | 5 refills | Status: DC

## 2016-11-09 NOTE — Progress Notes (Signed)
Left refills in the to be faxed file to [facility].   Marcy Sirenatherine Soraida Vickers, D.O. 11/09/2016, 4:06 PM PGY-2, [facility]

## 2016-11-15 MED FILL — ZOLPIDEM TARTRATE 5 MG TAB: 5 | 30 days supply | Qty: 30 | Fill #0

## 2016-11-15 MED FILL — traMADol HCL 50 MG TABS: 50 | 2 days supply | Qty: 5 | Fill #0

## 2016-11-18 ENCOUNTER — Other Ambulatory Visit: Payer: Self-pay | Admitting: Internal Medicine

## 2016-11-18 ENCOUNTER — Encounter: Payer: Self-pay | Admitting: Internal Medicine

## 2016-11-18 NOTE — Telephone Encounter (Signed)
Pt needs a refill on mirtazapine. Pt would like it sent to the [facility]. ep

## 2016-11-22 ENCOUNTER — Other Ambulatory Visit: Payer: Self-pay | Admitting: *Deleted

## 2016-11-23 MED ORDER — HYDROCHLOROTHIAZIDE 12.5 MG PO TABS: 12.5000 mg | ORAL_TABLET | Freq: Every day | ORAL | 5 refills | Status: DC

## 2016-11-25 NOTE — Telephone Encounter (Signed)
Remeron appears to have been discontinued by patient's previous PCP. Will not refill medication.

## 2016-11-30 ENCOUNTER — Ambulatory Visit: Payer: No Typology Code available for payment source

## 2016-12-02 ENCOUNTER — Ambulatory Visit: Payer: No Typology Code available for payment source | Admitting: Internal Medicine

## 2016-12-08 ENCOUNTER — Ambulatory Visit: Payer: No Typology Code available for payment source

## 2016-12-14 ENCOUNTER — Ambulatory Visit (INDEPENDENT_AMBULATORY_CARE_PROVIDER_SITE_OTHER): Payer: No Typology Code available for payment source | Admitting: Internal Medicine

## 2016-12-14 ENCOUNTER — Encounter: Payer: Self-pay | Admitting: Internal Medicine

## 2016-12-14 VITALS — BP 148/90 | HR 93 | Temp 98.9°F | Wt 166.0 lb

## 2016-12-14 DIAGNOSIS — Z794 Long term (current) use of insulin: Secondary | ICD-10-CM

## 2016-12-14 DIAGNOSIS — I10 Essential (primary) hypertension: Secondary | ICD-10-CM

## 2016-12-14 DIAGNOSIS — Z23 Encounter for immunization: Secondary | ICD-10-CM

## 2016-12-14 DIAGNOSIS — E119 Type 2 diabetes mellitus without complications: Secondary | ICD-10-CM

## 2016-12-14 LAB — POCT GLYCOSYLATED HEMOGLOBIN (HGB A1C): HEMOGLOBIN A1C: 8.5

## 2016-12-14 MED ORDER — MIRTAZAPINE 15 MG PO TABS: 7.5000 mg | ORAL_TABLET | Freq: Every day | ORAL | 2 refills | Status: DC

## 2016-12-14 MED ORDER — TRAMADOL HCL 50 MG PO TABS: 50.0000 mg | ORAL_TABLET | Freq: Three times a day (TID) | ORAL | 0 refills | Status: DC | PRN

## 2016-12-14 MED ORDER — HYDROCHLOROTHIAZIDE 12.5 MG PO TABS: 12.5000 mg | ORAL_TABLET | Freq: Every day | ORAL | 5 refills | Status: DC

## 2016-12-14 MED ORDER — LISINOPRIL 20 MG PO TABS: 20.0000 mg | ORAL_TABLET | Freq: Every day | ORAL | 3 refills | Status: DC

## 2016-12-14 MED ORDER — METFORMIN HCL 1000 MG PO TABS: 1000.0000 mg | ORAL_TABLET | Freq: Two times a day (BID) | ORAL | 3 refills | Status: DC

## 2016-12-14 MED ORDER — ROSUVASTATIN CALCIUM 10 MG PO TABS: 10.0000 mg | ORAL_TABLET | Freq: Every day | ORAL | 5 refills | Status: DC

## 2016-12-14 MED FILL — ZOLPIDEM TARTRATE 5 MG TAB: 5 | 30 days supply | Qty: 30 | Fill #1

## 2016-12-14 MED FILL — traMADol HCL 50 MG TABS: 50 | 10 days supply | Qty: 30 | Fill #0

## 2016-12-14 NOTE — Progress Notes (Signed)
   Subjective:    [name] - 61 y.o. female MRN [id number]  Date of birth: [birth year]  HPI  Monica CompRachel M Hanson is here for follow up of chronic medical conditions.  T2DM: Went >1 month without insulin and diabetic supplies. Now has been able to refill and recently started back monitoring her CBGs and taking insulin. She reports today with CBG log. Fastings <150. She has a couple fasting CBGs in the 70-80 range. She reports she knows the symptoms of hypoglycemia. She also checks her blood sugar most nights prior to dinner. These numbers ranged from 140-260. She denies vision changes, polyuria, nocturia, and nausea/vomiting.    Health Maintenance Due  Topic Date Due  . INFLUENZA VACCINE  06/14/2016  . PAP SMEAR  08/16/2016  . OPHTHALMOLOGY EXAM  09/09/2016    -  reports that she has never smoked. She quit smokeless tobacco use about 3 years ago. Her smokeless tobacco use included Snuff. - Review of Systems: Per HPI. - Past Medical History: Patient Active Problem List   Diagnosis Date Noted  . Pain and swelling of right knee 07/06/2016  . Rash 01/20/2016  . Loss of weight 11/25/2014  . Acute periodontitis 06/16/2014  . Fracture of fifth toe, left, closed 08/28/2013  . Plantar wart of right foot 05/10/2013  . Low back pain 05/06/2013  . Diabetes mellitus (HCC) 10/15/2012  . Cervical pain (neck) 03/14/2012  . Insomnia 10/10/2011  . Hallux rigidus of right foot 08/29/2011  . CATARACTS 12/16/2009  . SMOKELESS TOBACCO ABUSE 05/14/2008  . OBESITY 11/30/2007  . DIVERTICULOSIS, COLON W/HEM 08/15/2007  . History of TIA (transient ischemic attack) 02/12/2007  . Hyperlipidemia associated with type 2 diabetes mellitus (HCC) 01/11/2007  . Iron deficiency anemia 01/11/2007  . HYPERTENSION, BENIGN SYSTEMIC 01/11/2007   - Medications: reviewed and updated   Objective:   Physical Exam BP (!) 148/90   Pulse 93   Temp 98.9 F (37.2 C) (Oral)   Wt 166 lb (75.3 kg)   BMI 28.49 kg/m    Gen: NAD, alert, cooperative with exam, well-appearing HEENT: NCAT, PERRL, clear conjunctiva, oropharynx clear, supple neck CV: RRR, good S1/S2, no murmur, no edema, capillary refill brisk  Resp: CTABL, no wheezes, non-labored    Assessment & Plan:   Diabetes mellitus A1C essentially unchanged from prior visit. Result of 8.5 today (8.6 previously). Suspect lack of improvement related to non-adherence with insulin therapy >1 month. Patient to continue insulin regimen as is because fasting CBGs well controlled with a few borderline low sugars. Discussed appropriate diet and exercise regimen changes. Follow up in 3 months.     [name], D.O. 12/14/2016, 4:20 PM PGY-2, [facility]

## 2016-12-14 NOTE — Patient Instructions (Signed)
Please follow up in 3 months for another check up.   Take Care,   Dr. Earlene PlaterWallace

## 2016-12-14 NOTE — Assessment & Plan Note (Addendum)
A1C essentially unchanged from prior visit. Result of 8.5 today (8.6 previously). Suspect lack of improvement related to non-adherence with insulin therapy >1 month. Patient to continue insulin regimen as is because fasting CBGs well controlled with a few borderline low sugars. Discussed appropriate diet and exercise regimen changes. Follow up in 3 months.

## 2016-12-15 ENCOUNTER — Telehealth: Payer: Self-pay | Admitting: Internal Medicine

## 2016-12-15 DIAGNOSIS — Z794 Long term (current) use of insulin: Principal | ICD-10-CM

## 2016-12-15 DIAGNOSIS — E119 Type 2 diabetes mellitus without complications: Secondary | ICD-10-CM

## 2016-12-15 NOTE — Telephone Encounter (Signed)
Ophthalmology referral placed for diabetic eye exam.   [name], D.O. 12/15/2016, 10:54 AM PGY-2, [facility]

## 2016-12-16 ENCOUNTER — Telehealth: Payer: Self-pay | Admitting: *Deleted

## 2016-12-16 MED ORDER — HYDROCHLOROTHIAZIDE 25 MG PO TABS: 12.5000 mg | ORAL_TABLET | Freq: Every day | ORAL | 3 refills | Status: DC

## 2016-12-16 NOTE — Telephone Encounter (Signed)
[facility] from [facility] requesting to change HCTZ 12.5 tablet to capsules or to HCTZ 25 mg tablets.  Cheaper for patient.  Please advise. Clovis PuMartin, Debbrah Sampedro L, RN

## 2016-12-16 NOTE — Telephone Encounter (Signed)
It is fine to switch to HCTZ 12.5 mg capsules if that is cheaper for the patient.   Marcy Sirenatherine Eternity Dexter, D.O. 12/16/2016, 12:53 PM PGY-2, [facility]

## 2016-12-19 ENCOUNTER — Telehealth: Payer: Self-pay | Admitting: *Deleted

## 2016-12-19 MED ORDER — ATORVASTATIN CALCIUM 40 MG PO TABS: 40.0000 mg | ORAL_TABLET | Freq: Every day | ORAL | 1 refills | Status: DC

## 2016-12-19 NOTE — Telephone Encounter (Signed)
Received fax from [facility] stating Crestor was not available for free.  Medication was changed to Lipitor 40 mg.  [facility] did not receive the Rx for Lipitor.  Rx faxed to [facility].  Clovis PuMartin, Fani Rotondo L, RN

## 2017-01-12 ENCOUNTER — Other Ambulatory Visit: Payer: Self-pay | Admitting: Internal Medicine

## 2017-01-12 MED FILL — ZOLPIDEM TARTRATE 5 MG TAB: 5 | 30 days supply | Qty: 30 | Fill #2

## 2017-01-12 NOTE — Telephone Encounter (Signed)
Pt needs a refill on tramadol. ep °

## 2017-01-17 ENCOUNTER — Telehealth: Payer: Self-pay | Admitting: Internal Medicine

## 2017-01-17 MED ORDER — TRAMADOL HCL 50 MG PO TABS: 50.0000 mg | ORAL_TABLET | Freq: Three times a day (TID) | ORAL | 0 refills | Status: DC | PRN

## 2017-01-17 NOTE — Telephone Encounter (Signed)
Pt informed. Sharon T Saunders, CMA  

## 2017-01-17 NOTE — Telephone Encounter (Signed)
Refill for Tramadol provided and left up front for pick up.  Marcy Sirenatherine Moritz Lever, D.O. 01/17/2017, 1:41 PM PGY-2, [facility]

## 2017-01-19 MED FILL — traMADol HCL 50 MG TABS: 50 | 10 days supply | Qty: 30 | Fill #0

## 2017-02-07 ENCOUNTER — Other Ambulatory Visit: Payer: Self-pay | Admitting: *Deleted

## 2017-02-07 MED ORDER — INSULIN ASPART 100 UNIT/ML ~~LOC~~ SOLN: 2.0000 [IU] | Freq: Three times a day (TID) | SUBCUTANEOUS | 6 refills | Status: DC

## 2017-02-13 ENCOUNTER — Other Ambulatory Visit: Payer: Self-pay | Admitting: Internal Medicine

## 2017-02-13 DIAGNOSIS — G4700 Insomnia, unspecified: Secondary | ICD-10-CM

## 2017-02-13 NOTE — Telephone Encounter (Signed)
Pt called and would like a refill on her Ambien left up front. jw

## 2017-02-15 MED ORDER — ZOLPIDEM TARTRATE 5 MG PO TABS: 5.0000 mg | ORAL_TABLET | Freq: Every evening | ORAL | 2 refills | Status: DC | PRN

## 2017-02-15 NOTE — Telephone Encounter (Signed)
I have refilled patient's Ambien and will leave Rx up front for pick up.   Marcy Siren, D.O. 02/15/2017, 9:24 AM PGY-2, [facility]

## 2017-02-15 NOTE — Telephone Encounter (Signed)
Pt informed. Omari Koslosky T Darren Nodal, CMA  

## 2017-02-16 MED FILL — ZOLPIDEM TARTRATE 5 MG TAB: 5 | 30 days supply | Qty: 30 | Fill #0

## 2017-02-23 ENCOUNTER — Other Ambulatory Visit: Payer: Self-pay | Admitting: Internal Medicine

## 2017-02-23 NOTE — Telephone Encounter (Signed)
Patient calling to request refill of:  Name of Medication(s):  Tramadol Last date of OV:  12/14/2016 Pharmacy:  [facility]  Will route refill request to Clinic RN.  Discussed with patient policy to call pharmacy for future refills.  Also, discussed refills may take up to 48 hours to approve or deny.  [name]

## 2017-02-24 MED ORDER — TRAMADOL HCL 50 MG PO TABS: 50.0000 mg | ORAL_TABLET | Freq: Three times a day (TID) | ORAL | 2 refills | Status: DC | PRN

## 2017-02-24 MED FILL — traMADol HCL 50 MG TABS: 50 | 10 days supply | Qty: 30 | Fill #0

## 2017-02-24 NOTE — Telephone Encounter (Signed)
Pt informed of below. Zimmerman Rumple, Charrie Mcconnon D, CMA  

## 2017-02-24 NOTE — Telephone Encounter (Signed)
Have printed Rx for Tramadol and left up front.   Marcy Siren, D.O. 02/24/2017, 11:46 AM PGY-2, [facility]

## 2017-03-13 ENCOUNTER — Other Ambulatory Visit: Payer: Self-pay | Admitting: Family Medicine

## 2017-03-13 ENCOUNTER — Other Ambulatory Visit: Payer: Self-pay | Admitting: Internal Medicine

## 2017-03-16 MED FILL — ZOLPIDEM TARTRATE 5 MG TAB: 5 | 30 days supply | Qty: 30 | Fill #1

## 2017-03-23 ENCOUNTER — Other Ambulatory Visit: Payer: Self-pay | Admitting: Obstetrics and Gynecology

## 2017-03-23 DIAGNOSIS — Z1231 Encounter for screening mammogram for malignant neoplasm of breast: Secondary | ICD-10-CM

## 2017-03-30 ENCOUNTER — Ambulatory Visit (INDEPENDENT_AMBULATORY_CARE_PROVIDER_SITE_OTHER): Payer: No Typology Code available for payment source | Admitting: Internal Medicine

## 2017-03-30 ENCOUNTER — Encounter: Payer: Self-pay | Admitting: Internal Medicine

## 2017-03-30 VITALS — BP 140/90 | HR 93 | Temp 98.7°F | Ht 64.0 in | Wt 178.0 lb

## 2017-03-30 DIAGNOSIS — Z794 Long term (current) use of insulin: Secondary | ICD-10-CM

## 2017-03-30 DIAGNOSIS — I10 Essential (primary) hypertension: Secondary | ICD-10-CM

## 2017-03-30 DIAGNOSIS — E119 Type 2 diabetes mellitus without complications: Secondary | ICD-10-CM

## 2017-03-30 LAB — POCT GLYCOSYLATED HEMOGLOBIN (HGB A1C): HEMOGLOBIN A1C: 7.5

## 2017-03-30 MED ORDER — METFORMIN HCL 1000 MG PO TABS: 1000.0000 mg | ORAL_TABLET | Freq: Two times a day (BID) | ORAL | 3 refills | Status: DC

## 2017-03-30 MED ORDER — LISINOPRIL 20 MG PO TABS: 20.0000 mg | ORAL_TABLET | Freq: Every day | ORAL | 3 refills | Status: DC

## 2017-03-30 MED ORDER — ATORVASTATIN CALCIUM 40 MG PO TABS: 40.0000 mg | ORAL_TABLET | Freq: Every day | ORAL | 3 refills | Status: DC

## 2017-03-30 MED ORDER — INSULIN ASPART 100 UNIT/ML ~~LOC~~ SOLN: 3.0000 [IU] | Freq: Three times a day (TID) | SUBCUTANEOUS | 6 refills | Status: DC

## 2017-03-30 NOTE — Progress Notes (Signed)
   Subjective:    Monica CompRachel M Fiumara - 61 y.o. female MRN [id number]  Date of birth: 1956/05/21  HPI  [name] is here for follow up of chronic medical conditions.  Diabetes mellitus, Type 2 Disease Monitoring Blood Sugar Ranges: Fasting - Avg 110-160; Random - 130-210. Polyuria: no Visual problems: no   Last A1C: 8.5 (Jan 2018)  Medication Compliance: yes  Medication Side Effects Hypoglycemia: no   Preventitive Health Care Eye Exam: Scheduled on 5/22  Foot Exam: UTD   Chronic HTN Disease Monitoring:  Home BP Monitoring - no Chest pain- no  Dyspnea- no Headache - no  Medications: HCTZ 12.5 mg daily  Compliance- yes Lightheadedness- no  Edema- no    -  reports that she has never smoked. She quit smokeless tobacco use about 4 years ago. Her smokeless tobacco use included Snuff. - Review of Systems: Per HPI. - Past Medical History: Patient Active Problem List   Diagnosis Date Noted  . Pain and swelling of right knee 07/06/2016  . Rash 01/20/2016  . Loss of weight 11/25/2014  . Acute periodontitis 06/16/2014  . Fracture of fifth toe, left, closed 08/28/2013  . Plantar wart of right foot 05/10/2013  . Low back pain 05/06/2013  . Diabetes mellitus (HCC) 10/15/2012  . Cervical pain (neck) 03/14/2012  . Insomnia 10/10/2011  . Hallux rigidus of right foot 08/29/2011  . CATARACTS 12/16/2009  . SMOKELESS TOBACCO ABUSE 05/14/2008  . OBESITY 11/30/2007  . DIVERTICULOSIS, COLON W/HEM 08/15/2007  . History of TIA (transient ischemic attack) 02/12/2007  . Hyperlipidemia associated with type 2 diabetes mellitus (HCC) 01/11/2007  . Iron deficiency anemia 01/11/2007  . HYPERTENSION, BENIGN SYSTEMIC 01/11/2007   - Medications: reviewed and updated   Objective:   Physical Exam BP 140/90 (BP Location: Right Arm, Patient Position: Sitting, Cuff Size: Normal)   Pulse 93   Temp 98.7  F (37.1 C) (Oral)   Ht 5\' 4"  (1.626 m)   Wt 178 lb (80.7 kg)   SpO2 98%   BMI 30.55 kg/m  Gen: NAD, alert, cooperative with exam, well-appearing CV: RRR, good S1/S2, no murmur, no edema, capillary refill brisk  Resp: CTABL, no wheezes, non-labored    Assessment & Plan:   HYPERTENSION, BENIGN SYSTEMIC BP borderline today. She was not taking Lisinopril but had been continuing HCTZ. Will resume Lisinopril as this will benefit BP while also providing renal protectin given h/o T2DM. Patient to return for BP check and BMET in one week.   Diabetes mellitus Improvement in A1c today from 8.5 to 7.5. Patient compliant with insulin regimen and CBG monitoring. Will increase Lantus from 7u to 8u as patient has not had any episodes of hypoglycemia and may be able to benefit from slightly tighter glycemic control. Continue Novolog 3u TID. Patient to notify me if she starts having CBGs <80-90 with increase in Lantus.     Marcy Sirenatherine Merve Hotard, D.O. 04/01/2017, 12:17 PM PGY-2, [facility]

## 2017-03-30 NOTE — Patient Instructions (Addendum)
Increase your Lantus from 7 units to 8 units each morning. If you start having low blood sugars <80 please let me know.   Please return for a lab visit in the next week to check electrolytes with re-starting your Lisinopril.   You are due for a PAP smear.

## 2017-03-31 MED FILL — traMADol HCL 50 MG TABS: 50 | 10 days supply | Qty: 30 | Fill #1

## 2017-04-01 NOTE — Assessment & Plan Note (Signed)
BP borderline today. She was not taking Lisinopril but had been continuing HCTZ. Will resume Lisinopril as this will benefit BP while also providing renal protectin given h/o T2DM. Patient to return for BP check and BMET in one week.

## 2017-04-01 NOTE — Assessment & Plan Note (Signed)
Improvement in A1c today from 8.5 to 7.5. Patient compliant with insulin regimen and CBG monitoring. Will increase Lantus from 7u to 8u as patient has not had any episodes of hypoglycemia and may be able to benefit from slightly tighter glycemic control. Continue Novolog 3u TID. Patient to notify me if she starts having CBGs <80-90 with increase in Lantus.

## 2017-04-04 ENCOUNTER — Ambulatory Visit (HOSPITAL_COMMUNITY)
Admission: RE | Admit: 2017-04-04 | Discharge: 2017-04-04 | Disposition: A | Discharge status: Home / Self Care | Payer: Self-pay | Source: Ambulatory Visit | Attending: Obstetrics and Gynecology | Admitting: Obstetrics and Gynecology

## 2017-04-04 ENCOUNTER — Ambulatory Visit
Admission: RE | Admit: 2017-04-04 | Discharge: 2017-04-04 | Disposition: A | Discharge status: Home / Self Care | Payer: No Typology Code available for payment source | Source: Ambulatory Visit | Attending: Obstetrics and Gynecology | Admitting: Obstetrics and Gynecology

## 2017-04-04 ENCOUNTER — Encounter (HOSPITAL_COMMUNITY): Payer: Self-pay

## 2017-04-04 VITALS — BP 158/100 | Temp 98.7°F | Ht 64.0 in | Wt 181.4 lb

## 2017-04-04 DIAGNOSIS — Z1231 Encounter for screening mammogram for malignant neoplasm of breast: Secondary | ICD-10-CM

## 2017-04-04 DIAGNOSIS — Z1239 Encounter for other screening for malignant neoplasm of breast: Secondary | ICD-10-CM

## 2017-04-04 NOTE — Progress Notes (Signed)
No complaints today.   Pap Smear: Pap smear not completed today. Last [facility]/2013 [facility] and normal with negative HPV. Per patient has no history of an abnormal Pap smear. Last three Pap smear results are in EPIC.  Physical exam: Breasts Breasts symmetrical. No skin abnormalities bilateral breasts. No nipple retraction bilateral breasts. No nipple discharge bilateral breasts. No lymphadenopathy. No lumps palpated bilateral breasts. No complaints of pain or tenderness on exam. [facility] for [facility] mamm[facility]. Appointment scheduled for Tuesday, Apr 04, 2017 at 0900.        Pelvic/Bimanual No Pap smear completed today since last Pap smear and HPV typing was 08/16/2013. Pap smear not indicated per BCCCP guidelines.   Smoking History: Patient has never smoked.  Patient Navigation: Patient education provided. Access to services provided for patient through BCCCP program.   Colorectal Cancer Screening: Per patient had a colonoscopy completed 5-6 years ago. No complaints today. FIT Test given to patient to complete and return to BCCCP.

## 2017-04-04 NOTE — Patient Instructions (Signed)
Explained breast self awareness to [name]. Patient did not need a Pap smear today due to last Pap smear and HPV typing was 08/16/2013. Let her know BCCCP will cover Pap smears and HPV typing every 5 years unless has a history of abnormal Pap smears. [facility] for [facility] screening mamm[facility]. Appointment scheduled for Tuesday, Apr 04, 2017 at 0900. Let patient know the Breast Center will follow up with her within the next couple weeks with results of mammogram by letter or phone. [name] verbalized understanding.  Brannock, Kathaleen Maserhristine Poll, RN 8:17 AM

## 2017-04-05 ENCOUNTER — Other Ambulatory Visit: Payer: Self-pay | Admitting: Obstetrics and Gynecology

## 2017-04-11 ENCOUNTER — Encounter (HOSPITAL_COMMUNITY): Payer: Self-pay | Admitting: *Deleted

## 2017-04-13 ENCOUNTER — Other Ambulatory Visit: Payer: No Typology Code available for payment source

## 2017-04-13 DIAGNOSIS — I10 Essential (primary) hypertension: Secondary | ICD-10-CM

## 2017-04-13 LAB — FECAL OCCULT BLOOD, IMMUNOCHEMICAL: Fecal Occult Bld: NEGATIVE

## 2017-04-14 LAB — BASIC METABOLIC PANEL
BUN / CREAT RATIO: 12 (ref 12–28)
BUN: 12 mg/dL (ref 8–27)
CO2: 22 mmol/L (ref 18–29)
CREATININE: 0.99 mg/dL (ref 0.57–1.00)
Calcium: 9.5 mg/dL (ref 8.7–10.3)
Chloride: 98 mmol/L (ref 96–106)
GFR calc Af Amer: 72 mL/min/{1.73_m2} (ref >59)
GFR, EST NON AFRICAN AMERICAN: 62 mL/min/{1.73_m2} (ref >59)
GLUCOSE: 230 mg/dL — AB (ref 65–99)
Potassium: 4.6 mmol/L (ref 3.5–5.2)
Sodium: 138 mmol/L (ref 134–144)

## 2017-04-17 ENCOUNTER — Telehealth: Payer: Self-pay | Admitting: *Deleted

## 2017-04-17 ENCOUNTER — Encounter (HOSPITAL_COMMUNITY): Payer: Self-pay | Admitting: *Deleted

## 2017-04-17 NOTE — Telephone Encounter (Signed)
-----   Message from Arvilla Marketatherine Lauren Wallace, DO sent at 04/15/2017 10:17 AM EDT ----- Please call patient and let her know kidney function and electrolytes were normal on blood work. Her glucose was elevated to 230 so please re-emphasize diabetic diet and ensure she is adhering to her medication regimen.   [name], D.O. 04/15/2017, 10:17 AM PGY-2, [facility]

## 2017-04-17 NOTE — Telephone Encounter (Signed)
Pt informed of results. Zimmerman Rumple, April D, CMA  

## 2017-04-17 NOTE — Progress Notes (Signed)
Fit test results mailed to patient.

## 2017-04-18 ENCOUNTER — Encounter (HOSPITAL_COMMUNITY): Payer: Self-pay | Admitting: *Deleted

## 2017-04-18 NOTE — Progress Notes (Signed)
Letter mailed to patient with negative Fit Test results.  

## 2017-04-19 MED FILL — ZOLPIDEM TARTRATE 5 MG TAB: 5 | 30 days supply | Qty: 30 | Fill #2

## 2017-05-02 MED FILL — traMADol HCL 50 MG TABS: 50 | 10 days supply | Qty: 30 | Fill #2

## 2017-05-22 ENCOUNTER — Telehealth: Payer: Self-pay | Admitting: Internal Medicine

## 2017-05-22 DIAGNOSIS — G4700 Insomnia, unspecified: Secondary | ICD-10-CM

## 2017-05-22 MED ORDER — ZOLPIDEM TARTRATE 5 MG PO TABS: 5.0000 mg | ORAL_TABLET | Freq: Every evening | ORAL | 2 refills | Status: DC | PRN

## 2017-05-22 MED FILL — ZOLPIDEM TARTRATE 5 MG TAB: 5 | 30 days supply | Qty: 30 | Fill #0

## 2017-05-22 NOTE — Telephone Encounter (Signed)
Pt  calling to request refill of:  Name of Medication(s):  Remus Lofflerambien Last date of OV:  03/30/17 Pharmacy: none  Please call when ready for pickup   Will route refill request to Clinic RN.  Discussed with patient policy to call pharmacy for future refills.  Also, discussed refills may take up to 48 hours to approve or deny.  Avanell ShackletonHarriet C Shelton    Message given to pt

## 2017-05-22 NOTE — Telephone Encounter (Signed)
No answer and no machine.  Will try later. [name], Maryjo RochesterJessica Dawn, CMA

## 2017-05-22 NOTE — Telephone Encounter (Signed)
Have refilled Ambien and will leave at front desk for pick up.   Marcy Sirenatherine Olin Gurski, D.O. 05/22/2017, 10:51 AM PGY-3, [facility]

## 2017-05-24 NOTE — Telephone Encounter (Signed)
Checked up front and Rx has been picked up. Lamonte SakaiZimmerman Rumple, Ireoluwa Gorsline D, [place]

## 2017-06-05 ENCOUNTER — Other Ambulatory Visit: Payer: Self-pay | Admitting: Internal Medicine

## 2017-06-05 NOTE — Telephone Encounter (Signed)
Patient calling to request refill of:  Name of Medication(s):  Tramadol Last date of OV:  03/30/2017 [facility]  Will route refill request to Clinic RN.  Discussed with patient policy to call pharmacy for future refills.  Also, discussed refills may take up to 48 hours to approve or deny.  [name]

## 2017-06-06 MED ORDER — TRAMADOL HCL 50 MG PO TABS: 50.0000 mg | ORAL_TABLET | Freq: Three times a day (TID) | ORAL | 1 refills | Status: DC | PRN

## 2017-06-06 NOTE — Telephone Encounter (Signed)
Patient is aware and made an appointment for 06-22-17. Jazmin Hartsell,CMA

## 2017-06-06 NOTE — Telephone Encounter (Signed)
Patient should have appointment scheduled to discuss Tramadol use. Will provide refill for now.   [name], D.O. 06/06/2017, 2:25 PM PGY-3, [facility]

## 2017-06-07 MED FILL — traMADol HCL 50 MG TABS: 50 | 10 days supply | Qty: 30 | Fill #0

## 2017-06-07 NOTE — Telephone Encounter (Signed)
Patient picked up script. Calogero Geisen,CMA

## 2017-06-22 ENCOUNTER — Encounter: Payer: Self-pay | Admitting: Internal Medicine

## 2017-06-22 ENCOUNTER — Ambulatory Visit (INDEPENDENT_AMBULATORY_CARE_PROVIDER_SITE_OTHER): Payer: Self-pay | Admitting: Internal Medicine

## 2017-06-22 VITALS — BP 168/88 | HR 88 | Temp 98.5°F | Ht 64.0 in | Wt 181.6 lb

## 2017-06-22 DIAGNOSIS — E119 Type 2 diabetes mellitus without complications: Secondary | ICD-10-CM

## 2017-06-22 DIAGNOSIS — I10 Essential (primary) hypertension: Secondary | ICD-10-CM

## 2017-06-22 DIAGNOSIS — Z794 Long term (current) use of insulin: Secondary | ICD-10-CM

## 2017-06-22 LAB — POCT GLYCOSYLATED HEMOGLOBIN (HGB A1C): Hemoglobin A1C: 8.4

## 2017-06-22 NOTE — Patient Instructions (Signed)
Check your blood pressure a couple times at home once your restart the HCTZ or return for a Nurse Visit here in one week. Please call if your blood pressure remains >150/90.   Continue taking your insulin as is. Let me know what cholesterol medications are on formulary.

## 2017-06-22 NOTE — Progress Notes (Signed)
   Subjective:    Monica CompRachel M Linck - 61 y.o. female MRN [id number]  Date of birth: [birth year]  HPI  [name] is here for follow up of chronic medical conditions.  Diabetes mellitus, Type 2 Disease Monitoring Blood Sugar Ranges: Fasting - Average 120-160; Random - <200. Polyuria: No  Visual problems: no   Urine Microalbumin On Lisinopril   Last A1C: 7.5 (May 2018)   Medication Compliance: yes. At last office visit, increased Lantus from 7 units to 8 units.  Medication Side Effects Hypoglycemia: no   Preventitive Health Care Eye Exam: UTD (reports she had done recently)  Foot Exam: UTD   Chronic HTN Disease Monitoring:  Home BP Monitoring - no  Chest pain- no  Dyspnea- no Headache - no  Medications: Lisinopril and HCTZ. Patient has been without HCTZ for about 2 weeks.  Compliance- yes, until recently with HCTZ Lightheadedness- no  Edema- no    -  reports that she has never smoked. She quit smokeless tobacco use about 4 years ago. Her smokeless tobacco use included Snuff. - Review of Systems: Per HPI. - Past Medical History: Patient Active Problem List   Diagnosis Date Noted  . Pain and swelling of right knee 07/06/2016  . Rash 01/20/2016  . Loss of weight 11/25/2014  . Acute periodontitis 06/16/2014  . Fracture of fifth toe, left, closed 08/28/2013  . Plantar wart of right foot 05/10/2013  . Low back pain 05/06/2013  . Diabetes mellitus (HCC) 10/15/2012  . Cervical pain (neck) 03/14/2012  . Insomnia 10/10/2011  . Hallux rigidus of right foot 08/29/2011  . CATARACTS 12/16/2009  . SMOKELESS TOBACCO ABUSE 05/14/2008  . OBESITY 11/30/2007  . DIVERTICULOSIS, COLON W/HEM 08/15/2007  . History of TIA (transient ischemic attack) 02/12/2007  . Hyperlipidemia associated with type 2 diabetes mellitus (HCC) 01/11/2007  . Iron deficiency anemia 01/11/2007  . HYPERTENSION, BENIGN  SYSTEMIC 01/11/2007   - Medications: reviewed and updated   Objective:   Physical Exam BP (!) 168/88   Pulse 88   Temp 98.5 F (36.9 C) (Oral)   Ht 5\' 4"  (1.626 m)   Wt 181 lb 9.6 oz (82.4 kg)   SpO2 98%   BMI 31.17 kg/m  Gen: NAD, alert, cooperative with exam, well-appearing CV: RRR, good S1/S2, no murmur, no edema, capillary refill brisk  Resp: CTABL, no wheezes, non-labored    Assessment & Plan:   1. Type 2 diabetes mellitus without complication, with long-term current use of insulin (HCC) Patient with recent improvement in A1c; however, has now increased again to 8.4. Will continue current insulin regimen for now as patient has been brittle with many episodes of hypoglycemia and am hesitant to adjust significantly as she has not had any recent episodes of hypoglycemia recently with current regimen. Return in 1 month with CBG log. May benefit from pharmacy visit.  - HgB A1c  2. HYPERTENSION, BENIGN SYSTEMIC Elevated today. Suspect related to recent non-compliance with HCTZ. Patient to pick up medication today. Monitor BP at home and let me know if consistently elevated or return in one week for RN visit. No red flags on history.    [name], D.O. 06/22/2017, 2:06 PM PGY-3, [facility]

## 2017-06-23 MED FILL — ZOLPIDEM TARTRATE 5 MG TAB: 5 | 30 days supply | Qty: 30 | Fill #1

## 2017-06-26 ENCOUNTER — Telehealth: Payer: Self-pay | Admitting: *Deleted

## 2017-06-26 NOTE — Telephone Encounter (Signed)
Returned patient's call. States started HCTZ on 8/10. Today had sister check BP with home monitoring device and was 169/87. Has appt tomorrow in nurse clinic for BP check. Instructed patient to take HCTZ and lisinopril at least 2 hours before appt. Patient instructed on most accurate way to take BP, to keep BP log with date and time and how she is feeling and bring log to all future visits. Patient states understanding. Kinnie FeilL. Ducatte, RN, BSN

## 2017-06-26 NOTE — Telephone Encounter (Signed)
Pt's BP is 169/87, pt would like a nurse to call her back today. ep °

## 2017-07-07 MED FILL — traMADol HCL 50 MG TABS: 50 | 10 days supply | Qty: 30 | Fill #1

## 2017-07-28 MED FILL — ZOLPIDEM TARTRATE 5 MG TAB: 5 | 30 days supply | Qty: 30 | Fill #2

## 2017-08-29 ENCOUNTER — Telehealth: Payer: Self-pay

## 2017-08-29 DIAGNOSIS — G4700 Insomnia, unspecified: Secondary | ICD-10-CM

## 2017-08-29 NOTE — Telephone Encounter (Signed)
Patient called for a refill on Ambien please. She called her pharmacy already.[name]

## 2017-08-30 MED ORDER — ZOLPIDEM TARTRATE 5 MG PO TABS: 5.0000 mg | ORAL_TABLET | Freq: Every evening | ORAL | 2 refills | Status: DC | PRN

## 2017-08-30 NOTE — Telephone Encounter (Signed)
Pt informed. Zimmerman Rumple, April D, CMA  

## 2017-08-30 NOTE — Telephone Encounter (Signed)
Ambien Rx left up front for pick up.   [name], D.O. 08/30/2017, 2:24 PM PGY-3, [facility]

## 2017-08-31 MED FILL — ZOLPIDEM TARTRATE 5 MG TAB: 5 | 30 days supply | Qty: 30 | Fill #0

## 2017-09-04 ENCOUNTER — Telehealth: Payer: Self-pay | Admitting: Internal Medicine

## 2017-09-04 NOTE — Telephone Encounter (Signed)
Pt is calling for a refill on her Tramadol. Please let patient know when this is ready to pick up. jw

## 2017-09-13 ENCOUNTER — Ambulatory Visit (INDEPENDENT_AMBULATORY_CARE_PROVIDER_SITE_OTHER): Payer: Self-pay | Admitting: Internal Medicine

## 2017-09-13 ENCOUNTER — Encounter: Payer: Self-pay | Admitting: Internal Medicine

## 2017-09-13 VITALS — BP 138/78 | HR 88 | Temp 98.5°F | Ht 64.0 in | Wt 183.8 lb

## 2017-09-13 DIAGNOSIS — E1169 Type 2 diabetes mellitus with other specified complication: Secondary | ICD-10-CM

## 2017-09-13 DIAGNOSIS — Z794 Long term (current) use of insulin: Secondary | ICD-10-CM

## 2017-09-13 DIAGNOSIS — Z23 Encounter for immunization: Secondary | ICD-10-CM

## 2017-09-13 DIAGNOSIS — E119 Type 2 diabetes mellitus without complications: Secondary | ICD-10-CM

## 2017-09-13 DIAGNOSIS — E785 Hyperlipidemia, unspecified: Secondary | ICD-10-CM

## 2017-09-13 DIAGNOSIS — G8929 Other chronic pain: Secondary | ICD-10-CM

## 2017-09-13 LAB — POCT GLYCOSYLATED HEMOGLOBIN (HGB A1C): Hemoglobin A1C: 7.9

## 2017-09-13 MED ORDER — ATORVASTATIN CALCIUM 40 MG PO TABS: 40.0000 mg | ORAL_TABLET | Freq: Every day | ORAL | 3 refills | Status: DC

## 2017-09-13 MED ORDER — TRAMADOL HCL 50 MG PO TABS: 50.0000 mg | ORAL_TABLET | Freq: Three times a day (TID) | ORAL | 1 refills | Status: DC | PRN

## 2017-09-13 MED ORDER — HYDROCHLOROTHIAZIDE 25 MG PO TABS: 12.5000 mg | ORAL_TABLET | Freq: Every day | ORAL | 3 refills | Status: DC

## 2017-09-13 MED FILL — traMADol HCL 50 MG TABS: 50 | 10 days supply | Qty: 30 | Fill #0

## 2017-09-13 NOTE — Progress Notes (Signed)
Subjective:    [name] - 61 y.o. female MRN [id number]  Date of birth: 12-06-1955  HPI  [name] is here for follow up of chronic medical conditions.  Diabetes mellitus, Type 2 Disease Monitoring Blood Sugar Ranges: Fasting - Average 100-120; Random - <200. Polyuria: No  Visual problems: no              Urine Microalbumin On Lisinopril              Last A1C: 8.4 (Aug 2018)   Medication Compliance: yes. Takes Lantus 8u daily and Novolog 3u TID. Metformin 1000 mg BID.  Medication Side Effects Hypoglycemia: no   Preventitive Health Care Eye Exam: UTD (reports she had done recently)  Foot Exam: Done today   Chronic HTN Disease Monitoring:  Home BP Monitoring - no  Chest pain- no  Dyspnea- no Headache - no  Medications: Lisinopril and HCTZ.  Compliance- yesLightheadedness- no  Edema- no    Chronic pain: Has chronic pain in knees and lower back. Has been taking Tramadol for this pain on and off for several years. Requests refills of Tramadol today. Reports she does not take it daily. Medication helps to improve her function and she is able to work, do household chores, etc while taking medication. Storing it properly.    -  reports that she has never smoked. She quit smokeless tobacco use about 4 years ago. Her smokeless tobacco use included Snuff. - Review of Systems: Per HPI. - Past Medical History: Patient Active Problem List   Diagnosis Date Noted  . Chronic pain 09/14/2017  . Pain and swelling of right knee 07/06/2016  . Low back pain 05/06/2013  . Diabetes mellitus (HCC) 10/15/2012  . Cervical pain (neck) 03/14/2012  . Insomnia 10/10/2011  . Hallux rigidus of right foot 08/29/2011  . CATARACTS 12/16/2009  . SMOKELESS TOBACCO ABUSE 05/14/2008  . OBESITY 11/30/2007  . DIVERTICULOSIS, COLON W/HEM 08/15/2007  . History of TIA (transient ischemic attack)  02/12/2007  . Hyperlipidemia associated with type 2 diabetes mellitus (HCC) 01/11/2007  . Iron deficiency anemia 01/11/2007  . HYPERTENSION, BENIGN SYSTEMIC 01/11/2007   - Medications: reviewed and updated   Objective:   Physical Exam BP 138/78   Pulse 88   Temp 98.5 F (36.9 C) (Oral)   Ht 5\' 4"  (1.626 m)   Wt 183 lb 12.8 oz (83.4 kg)   SpO2 98%   BMI 31.55 kg/m  Gen: NAD, alert, cooperative with exam, well-appearing HEENT: NCAT, PERRL, clear conjunctiva, oropharynx clear, supple neck CV: RRR, good S1/S2, no murmur, no edema, capillary refill brisk  Resp: CTABL, no wheezes, non-labored Abd: SNTND, BS present, no guarding or organomegaly Skin: no rashes, normal turgor  Neuro: no gross deficits.  Psych: good insight, alert and oriented  Diabetic Foot Check -  Appearance - no lesions, ulcers or calluses Skin - no unusual pallor or redness Monofilament testing - normal bilaterally  Right - Great toe, medial, central, lateral ball and posterior foot intact Left - Great toe, medial, central, lateral ball and posterior foot intact    Assessment & Plan:   Diabetes mellitus Improvement in control with A1c improving to 7.9%. Continue current insulin regimen as patient has achieved better glucose control and is not having any hypoglycemic episodes. Follow up in 3 weeks.   Chronic pain Pain in knees and lower back. Well controlled with intermittent Tramadol use. Reviewed [place] controlled substance database and patient filling medication appropriately. Will provide  another 30 tablets with 1 refill.     Marcy Siren, D.O. 09/14/2017, 2:51 PM PGY-3, [facility]

## 2017-09-13 NOTE — Patient Instructions (Signed)
Good to see you today! Please return in 3 months for follow up. Good work with your diabetes!   Take Care,   Dr. Earlene PlaterWallace

## 2017-09-14 DIAGNOSIS — G8929 Other chronic pain: Secondary | ICD-10-CM | POA: Insufficient documentation

## 2017-09-14 NOTE — Assessment & Plan Note (Signed)
Improvement in control with A1c improving to 7.9%. Continue current insulin regimen as patient has achieved better glucose control and is not having any hypoglycemic episodes. Follow up in 3 weeks.

## 2017-09-14 NOTE — Assessment & Plan Note (Signed)
Pain in knees and lower back. Well controlled with intermittent Tramadol use. Reviewed [place] controlled substance database and patient filling medication appropriately. Will provide another 30 tablets with 1 refill.

## 2017-10-02 ENCOUNTER — Other Ambulatory Visit: Payer: Self-pay | Admitting: Internal Medicine

## 2017-10-02 MED ORDER — MIRTAZAPINE 15 MG PO TABS: 7.5000 mg | ORAL_TABLET | Freq: Every day | ORAL | 2 refills | Status: DC

## 2017-10-02 MED FILL — ZOLPIDEM TARTRATE 5 MG TAB: 5 | 30 days supply | Qty: 30 | Fill #1

## 2017-10-02 NOTE — Telephone Encounter (Signed)
Pt forgot to ask at her checkup for a refill on mirtazapine.  It is suppose to help with her appetite.  Please call in to the health dept.

## 2017-10-10 ENCOUNTER — Other Ambulatory Visit: Payer: Self-pay | Admitting: Internal Medicine

## 2017-10-16 MED FILL — traMADol HCL 50 MG TABS: 50 | 10 days supply | Qty: 30 | Fill #1

## 2017-10-27 ENCOUNTER — Telehealth: Payer: Self-pay | Admitting: *Deleted

## 2017-10-27 NOTE — Telephone Encounter (Signed)
Received message on nurse line from Crystal with [facility] [phone]) stating patient had reported weakness in past on lipitor but has been taking atorvastatin 40 mg daily without difficulty. LM for Crystal that patient has no known allergies. Kinnie FeilL. Niah Heinle, RN, BSN

## 2017-11-02 MED FILL — ZOLPIDEM TARTRATE 5 MG TAB: 5 | 30 days supply | Qty: 30 | Fill #2

## 2017-11-20 ENCOUNTER — Other Ambulatory Visit: Payer: Self-pay | Admitting: Internal Medicine

## 2017-11-20 NOTE — Telephone Encounter (Signed)
Pt would like a refill on tramadol. Please advise

## 2017-11-23 ENCOUNTER — Other Ambulatory Visit: Payer: Self-pay | Admitting: Internal Medicine

## 2017-11-23 MED ORDER — TRAMADOL HCL 50 MG PO TABS: 50.0000 mg | ORAL_TABLET | Freq: Three times a day (TID) | ORAL | 1 refills | Status: DC | PRN

## 2017-11-23 NOTE — Progress Notes (Signed)
Have printed Rx for Tramadol and will leave at front desk.   Marcy Sirenatherine Obbie Lewallen, D.O. 11/23/2017, 1:41 PM PGY-3, [facility]

## 2017-11-23 NOTE — Progress Notes (Signed)
Pt informed. Sharon T Saunders, CMA  

## 2017-11-23 NOTE — Telephone Encounter (Signed)
Pt has been informed that the Rx is up front for pick up. Sunday SpillersSharon T Ryliee Figge, CMA

## 2017-11-23 NOTE — Progress Notes (Signed)
Have printed Rx for Tramadol and will leave up front.   [name], D.O. 11/23/2017, 1:37 PM PGY-3, [facility]

## 2017-11-23 NOTE — Telephone Encounter (Signed)
Pt calling again about this refill.

## 2017-11-24 MED FILL — traMADol HCL 50 MG TABS: 50 | 10 days supply | Qty: 30 | Fill #0

## 2017-12-05 ENCOUNTER — Other Ambulatory Visit: Payer: Self-pay | Admitting: Internal Medicine

## 2017-12-05 DIAGNOSIS — G4700 Insomnia, unspecified: Secondary | ICD-10-CM

## 2017-12-05 NOTE — Telephone Encounter (Signed)
Pt called because she needs refills on her Mirtazapine, and Ambien,. Please call patient when ready to pick up. jw

## 2017-12-08 MED ORDER — MIRTAZAPINE 15 MG PO TABS: 7.5000 mg | ORAL_TABLET | Freq: Every day | ORAL | 5 refills | Status: DC

## 2017-12-08 MED ORDER — ZOLPIDEM TARTRATE 5 MG PO TABS: 5.0000 mg | ORAL_TABLET | Freq: Every evening | ORAL | 1 refills | Status: DC | PRN

## 2017-12-08 MED FILL — ZOLPIDEM TARTRATE 5 MG TAB: 5 | 30 days supply | Qty: 30 | Fill #0

## 2017-12-08 NOTE — Telephone Encounter (Signed)
Pt calling again about her med refills. She is wondering why it is taking so long

## 2017-12-08 NOTE — Telephone Encounter (Signed)
The health dept is saying they did not get any medications for this pt. Pt is at health dept right now. Please advise

## 2017-12-08 NOTE — Telephone Encounter (Signed)
Have sent in Rx for Remeron. Will leave Ambien Rx at front desk for pickup.   [name], D.O. 12/08/2017, 1:39 PM PGY-3, [facility]

## 2017-12-10 NOTE — Telephone Encounter (Signed)
Sent Rx for Remeron by fax. Ambien Rx at front desk for pickup.   Marcy Sirenatherine Sankalp Ferrell, D.O. 12/10/2017, 9:03 AM PGY-3, [facility]

## 2017-12-11 MED ORDER — MIRTAZAPINE 15 MG PO TABS: 7.5000 mg | ORAL_TABLET | Freq: Every day | ORAL | 5 refills | Status: DC

## 2017-12-11 NOTE — Addendum Note (Signed)
Addended by: De HollingsheadWALLACE, Damir Leung L on: 12/11/2017 09:07 AM   Modules accepted: Orders

## 2017-12-11 NOTE — Telephone Encounter (Signed)
Pt called again this morning saying the health dept has not received her Rx. Told her it was sent again yesterday and she said she called them this morning and they still have not gotten it.

## 2017-12-11 NOTE — Telephone Encounter (Signed)
I have re-sent Remeron to the Health Dept. Again, she will need to come pick up her Ambien paper script in person from the front desk.   Marcy Sirenatherine Shalik Sanfilippo, D.O. 12/11/2017, 9:06 AM PGY-3, [facility]

## 2017-12-12 ENCOUNTER — Other Ambulatory Visit: Payer: Self-pay | Admitting: Internal Medicine

## 2017-12-12 NOTE — Telephone Encounter (Signed)
Contacted pharmacy and called in the RX. Lamonte SakaiZimmerman Rumple, April D, [place]

## 2017-12-12 NOTE — Telephone Encounter (Signed)
[facility] and called in pt rx for mirtazapine. Lamonte SakaiZimmerman Rumple, April D, [place]

## 2017-12-12 NOTE — Telephone Encounter (Signed)
Pt called because we faxed in her medication Mirtazapine to [facility]. The problem is that their fax machine is down and we need to call in her medication. jw

## 2017-12-12 NOTE — Telephone Encounter (Signed)
Health Dept is still telling this patient they have not received any Rx for this patient

## 2017-12-27 ENCOUNTER — Ambulatory Visit: Payer: Self-pay

## 2017-12-29 ENCOUNTER — Encounter: Payer: Self-pay | Admitting: Internal Medicine

## 2017-12-29 ENCOUNTER — Other Ambulatory Visit: Payer: Self-pay

## 2017-12-29 ENCOUNTER — Ambulatory Visit (INDEPENDENT_AMBULATORY_CARE_PROVIDER_SITE_OTHER): Payer: Self-pay | Admitting: Internal Medicine

## 2017-12-29 VITALS — BP 146/82 | HR 93 | Temp 98.7°F | Ht 64.0 in | Wt 178.0 lb

## 2017-12-29 DIAGNOSIS — E119 Type 2 diabetes mellitus without complications: Secondary | ICD-10-CM

## 2017-12-29 DIAGNOSIS — F32A Depression, unspecified: Secondary | ICD-10-CM

## 2017-12-29 DIAGNOSIS — F329 Major depressive disorder, single episode, unspecified: Secondary | ICD-10-CM

## 2017-12-29 DIAGNOSIS — Z794 Long term (current) use of insulin: Secondary | ICD-10-CM

## 2017-12-29 DIAGNOSIS — G4700 Insomnia, unspecified: Secondary | ICD-10-CM

## 2017-12-29 MED ORDER — MIRTAZAPINE 15 MG PO TABS: 15.0000 mg | ORAL_TABLET | Freq: Every day | ORAL | 5 refills | Status: DC

## 2017-12-29 MED ORDER — TRAZODONE HCL 50 MG PO TABS: 25.0000 mg | ORAL_TABLET | Freq: Every evening | ORAL | 3 refills | Status: DC | PRN

## 2017-12-29 NOTE — Patient Instructions (Signed)
Always a pleasure to see you! I am sorry to hear about your brother. My thoughts will be with you and your family during this hard time. If you ever want counseling we have great behavioral health specialists at our clinic included in our services. You are also always welcome to make an appointment with me.   I have increased your Remeron to 15 mg daily. There is still room to increase this dose if you feel that it does not control your symptoms well enough. I have also prescribed Trazodone for sleep. You can start with 1/2 tablet at night and increase to 1 tablet nightly.   Please pick up your diabetic supplies. We will call you with [name] result.   Take Care,   Dr. Earlene PlaterWallace

## 2017-12-29 NOTE — Progress Notes (Signed)
Subjective:    [name] - 62 y.o. female MRN [id number]  Date of birth: [birth year]  HPI  [name] is here for follow up.    Diabetes mellitus, Type 2 Disease Monitoring Blood Sugar Ranges: Patient reports an increase in her fasting CBGs since the passing of her brother a few weeks ago. Notes that she has had numbers above 200 at times fasting. Does not have log today. Has not been able to check recently as she has been out of diabetic supplies for past few days.  Polyuria: No  Visual problems: no Urine Microalbumin On Lisinopril  Last A1C: 7.9 (October 7.9)   Medication Compliance: yes. Takes Lantus 8u daily and Novolog 3u TID. Metformin 1000 mg BID.  Medication Side Effects Hypoglycemia: no  Preventitive Health Care Eye Exam: UTD (reports she had done recently)  Foot Exam: Done today   Insomnia: Patient reports several months of trouble falling asleep and staying asleep despite taking Ambien 5 mg nightly. She has been taking this medication for several years. She did recently lose her brother earlier this month but reports problems sleeping started long before the loss of her brother.   Depression: She is taking Remeron 7.5 mg nightly. She requests dose increase of this medication. She denies SI. Reports that she is having trouble focusing and finding motivation to do daily tasks.     -  reports that  has never smoked. She quit smokeless tobacco use about 4 years ago. Her smokeless tobacco use included snuff. - Review of Systems: Per HPI. - Past Medical History: Patient Active Problem List   Diagnosis Date Noted  . Chronic pain 09/14/2017  . Pain and swelling of right knee 07/06/2016  . Low back pain 05/06/2013  . Diabetes mellitus (HCC) 10/15/2012  . Cervical pain (neck) 03/14/2012  . Insomnia 10/10/2011  . Hallux rigidus of right foot 08/29/2011  .  CATARACTS 12/16/2009  . SMOKELESS TOBACCO ABUSE 05/14/2008  . OBESITY 11/30/2007  . DIVERTICULOSIS, COLON W/HEM 08/15/2007  . History of TIA (transient ischemic attack) 02/12/2007  . Hyperlipidemia associated with type 2 diabetes mellitus (HCC) 01/11/2007  . Iron deficiency anemia 01/11/2007  . HYPERTENSION, BENIGN SYSTEMIC 01/11/2007   - Medications: reviewed and updated   Objective:   Physical Exam BP (!) 146/82   Pulse 93   Temp 98.7 F (37.1 C) (Oral)   Ht 5\' 4"  (1.626 m)   Wt 178 lb (80.7 kg)   SpO2 97%   BMI 30.55 kg/m  Gen: NAD, alert, cooperative with exam, well-appearing CV: RRR, good S1/S2, no murmur, no edema, capillary refill brisk  Resp: CTABL, no wheezes, non-labored Psych: good insight, alert and oriented     Assessment & Plan:   Diabetes mellitus Will check Hemoglobin A1c today as send out lab as POCT not available in clinic at present. Not unexpected that patient has noticed an increase in glucose likely as stress reaction. Explained to patient that stress can elevate CBGs. Will follow up A1c.   Insomnia Will attempt trial of Trazodone in place of Ambien. Sleep hygiene reviewed. Increase in Remeron may also help.   Depression, unspecified depression type Likely worsened by acute grief reaction as well. However, patient is on very low dose of Remeron. Will increase from 7.5 mg to 15 mg nightly. Discussed with patient that we can adjust further as needed. Will plan to have patient fill out PHQ-9 at next visit. Interestingly, PHQ-2 score of 0 with nursing but patient admits  to depressive symptoms during discussion and requests adjustment of medication.   Marcy Sirenatherine Luian Schumpert, D.O. 01/01/2018, 1:42 PM PGY-3, [facility]

## 2017-12-30 LAB — HEMOGLOBIN A1C
Est. average glucose Bld gHb Est-mCnc: 240 mg/dL
Hgb A1c MFr Bld: 10 % — ABNORMAL HIGH (ref 4.8–5.6)

## 2018-01-01 ENCOUNTER — Telehealth: Payer: Self-pay | Admitting: Internal Medicine

## 2018-01-01 MED FILL — traMADol HCL 50 MG TABS: 50 | 10 days supply | Qty: 30 | Fill #1

## 2018-01-01 MED FILL — ZOLPIDEM TARTRATE 5 MG TAB: 5 | 30 days supply | Qty: 30 | Fill #1

## 2018-01-01 NOTE — Assessment & Plan Note (Signed)
Will check Hemoglobin A1c today as send out lab as POCT not available in clinic at present. Not unexpected that patient has noticed an increase in glucose likely as stress reaction. Explained to patient that stress can elevate CBGs. Will follow up A1c.

## 2018-01-01 NOTE — Telephone Encounter (Signed)
Patient calling concerning her Rx for Trazodone. When she was in Friday, it was sent to the health department but they never received it. She would like this to be called in instead of done electronically. Please advise

## 2018-01-01 NOTE — Assessment & Plan Note (Signed)
Will attempt trial of Trazodone in place of Ambien. Sleep hygiene reviewed. Increase in Remeron may also help.

## 2018-01-01 NOTE — Telephone Encounter (Signed)
[facility] with [facility]ist to fill the Trazodone.   [name], D.O. 01/01/2018, 2:50 PM PGY-3, [facility]

## 2018-01-17 ENCOUNTER — Telehealth: Payer: Self-pay | Admitting: Internal Medicine

## 2018-01-17 NOTE — Telephone Encounter (Signed)
If this is referring to her insomnia and depression, I would like to have her come back in as I needed to follow up with her regarding this.  Marcy Sirenatherine Analyah Mcconnon, D.O. 01/17/2018, 11:05 AM PGY-3, [facility]

## 2018-01-17 NOTE — Telephone Encounter (Signed)
Contacted pt and scheduled her an appointment for 01/24/18 to discuss this. Lamonte SakaiZimmerman Rumple, Eh Sesay D, [place]

## 2018-01-17 NOTE — Telephone Encounter (Signed)
Pt called to tell [name] the new medications she put her on are not working. Please advise

## 2018-01-24 ENCOUNTER — Encounter: Payer: Self-pay | Admitting: Internal Medicine

## 2018-01-24 ENCOUNTER — Other Ambulatory Visit: Payer: Self-pay

## 2018-01-24 ENCOUNTER — Ambulatory Visit (INDEPENDENT_AMBULATORY_CARE_PROVIDER_SITE_OTHER): Payer: Self-pay | Admitting: Internal Medicine

## 2018-01-24 DIAGNOSIS — G4700 Insomnia, unspecified: Secondary | ICD-10-CM

## 2018-01-24 MED ORDER — ZOLPIDEM TARTRATE 10 MG PO TABS: ORAL_TABLET | ORAL | 1 refills | Status: DC

## 2018-01-24 MED ORDER — ZOLPIDEM TARTRATE 10 MG PO TABS: 10.0000 mg | ORAL_TABLET | Freq: Every evening | ORAL | 1 refills | Status: DC | PRN

## 2018-01-29 MED FILL — ZOLPIDEM TARTRATE 10 MG TAB: 10 | 30 days supply | Qty: 30 | Fill #0

## 2018-01-29 NOTE — Progress Notes (Signed)
   Subjective:    [name] - 62 y.o. female MRN [id number]  Date of birth: 12-Oct-1956  HPI  Monica CompRachel M Hanson is here for follow up of insomnia. Patient was seen on 2/15 for insomnia after recent passing of her brother. Patient has used Ambien 5 mg chronically for insomnia but at that time reported that medication was not helping. She was having trouble falling asleep and staying asleep. Prescribed trial of Trazodone. Patient reports that she had significant nausea and GI upset with this medication so she discontinued it. Has been averaging about 4 hours of sleep per night for several weeks. PHQ-9 score of 5 today (score mostly related to feeling tired and trouble with sleep). No safety concerns.   -  reports that  has never smoked. She quit smokeless tobacco use about 5 years ago. Her smokeless tobacco use included snuff. - Review of Systems: Per HPI. - Past Medical History: Patient Active Problem List   Diagnosis Date Noted  . Chronic pain 09/14/2017  . Pain and swelling of right knee 07/06/2016  . Low back pain 05/06/2013  . Diabetes mellitus (HCC) 10/15/2012  . Cervical pain (neck) 03/14/2012  . Insomnia 10/10/2011  . Hallux rigidus of right foot 08/29/2011  . CATARACTS 12/16/2009  . SMOKELESS TOBACCO ABUSE 05/14/2008  . OBESITY 11/30/2007  . DIVERTICULOSIS, COLON W/HEM 08/15/2007  . History of TIA (transient ischemic attack) 02/12/2007  . Hyperlipidemia associated with type 2 diabetes mellitus (HCC) 01/11/2007  . Iron deficiency anemia 01/11/2007  . HYPERTENSION, BENIGN SYSTEMIC 01/11/2007   - Medications: reviewed and updated   Objective:   Physical Exam BP (!) 148/92   Pulse 84   Temp 98.8 F (37.1 C) (Oral)   Ht 5\' 4"  (1.626 m)   Wt 181 lb (82.1 kg)   SpO2 98%   BMI 31.07 kg/m  Gen: NAD, alert, cooperative with exam, well-appearing Psych: good insight, alert and oriented     Assessment & Plan:   1. Insomnia, unspecified type Suspect insomnia acutely  worsened due to grief reaction. PHQ-9 score does not show any evidence of significant depressive component. Patient did not tolerate Trazodone well. She requests increase in Ambien dose. She has tolerated 5 mg well without side effects. Discussed with patient that typically do not recommend doses of more than 5 mg for women. However, given that she is not Ambien naive she would likely be able to tolerate increased dose without significant AEs. Have counseled patient that we will try to wean dose back down over time but that she can attempt trial of 10 mg at home. Counseled on importance of good sleep hygiene.  - zolpidem (AMBIEN) 10 MG tablet; Take 1/2-1 tablet nightly prn sleep.  Dispense: 30 tablet; Refill: 1   Additionally, discussed with patient that her A1c worsened to 10.0 at last visit. Increase in glucose from stress may be contributing. Have asked patient to return in 1 month with CBG log for repeat A1c testing.   Marcy Sirenatherine Avis Tirone, D.O. 01/29/2018, 1:22 PM PGY-3, [facility]

## 2018-02-05 ENCOUNTER — Other Ambulatory Visit: Payer: Self-pay

## 2018-02-05 MED ORDER — TRAMADOL HCL 50 MG PO TABS: 50.0000 mg | ORAL_TABLET | Freq: Three times a day (TID) | ORAL | 1 refills | Status: DC | PRN

## 2018-02-05 NOTE — Telephone Encounter (Signed)
Pt requesting refill of tramadol\ Shawna OrleansMeredith B Thomsen, RN

## 2018-02-06 ENCOUNTER — Telehealth: Payer: Self-pay | Admitting: Internal Medicine

## 2018-02-06 NOTE — Telephone Encounter (Signed)
Needs a refill for her Tramadol.

## 2018-02-09 NOTE — Telephone Encounter (Signed)
Pt called about her tramadol refill. It shows it was printed 02-05-18 but it is not up fromt. She called on the 26 to request a refill.  Please let pt know when it is ready to be picked up

## 2018-02-12 ENCOUNTER — Other Ambulatory Visit: Payer: Self-pay | Admitting: Internal Medicine

## 2018-02-12 MED ORDER — TRAMADOL HCL 50 MG PO TABS: 50.0000 mg | ORAL_TABLET | Freq: Three times a day (TID) | ORAL | 1 refills | Status: DC | PRN

## 2018-02-12 MED FILL — traMADol HCL 50 MG TABS: 50 | 10 days supply | Qty: 30 | Fill #0

## 2018-02-12 NOTE — Telephone Encounter (Signed)
Informed pt of below. [name], [name], CMA   I have re-printed her Tramadol Rx and left up front.   [name], Hanson.O. 02/12/2018, 8:36 AM PGY-3, [facility]      Documentation

## 2018-02-12 NOTE — Progress Notes (Signed)
I have re-printed her Tramadol Rx and left up front.   Marcy Sirenatherine Abella Shugart, D.O. 02/12/2018, 8:36 AM PGY-3, [facility]

## 2018-02-15 ENCOUNTER — Other Ambulatory Visit: Payer: Self-pay

## 2018-02-19 MED ORDER — INSULIN ASPART 100 UNIT/ML ~~LOC~~ SOLN: 3.0000 [IU] | Freq: Three times a day (TID) | SUBCUTANEOUS | 6 refills | Status: DC

## 2018-02-19 MED ORDER — INSULIN GLARGINE 100 UNIT/ML ~~LOC~~ SOLN: 7.0000 [IU] | Freq: Every day | SUBCUTANEOUS | 2 refills | Status: DC

## 2018-02-26 ENCOUNTER — Other Ambulatory Visit: Payer: Self-pay | Admitting: Internal Medicine

## 2018-02-26 DIAGNOSIS — Z1231 Encounter for screening mammogram for malignant neoplasm of breast: Secondary | ICD-10-CM

## 2018-02-27 ENCOUNTER — Ambulatory Visit: Payer: Self-pay | Admitting: Internal Medicine

## 2018-03-05 MED FILL — ZOLPIDEM TARTRATE 10 MG TAB: 10 | 30 days supply | Qty: 30 | Fill #1

## 2018-03-08 MED FILL — traMADol HCL 50 MG TABS: 50 | 10 days supply | Qty: 30 | Fill #1

## 2018-03-12 ENCOUNTER — Encounter: Payer: Self-pay | Admitting: Internal Medicine

## 2018-03-12 ENCOUNTER — Ambulatory Visit (INDEPENDENT_AMBULATORY_CARE_PROVIDER_SITE_OTHER): Payer: Self-pay | Admitting: Internal Medicine

## 2018-03-12 ENCOUNTER — Other Ambulatory Visit: Payer: Self-pay

## 2018-03-12 VITALS — BP 132/68 | HR 86 | Temp 98.4°F | Ht 64.0 in | Wt 182.4 lb

## 2018-03-12 DIAGNOSIS — E119 Type 2 diabetes mellitus without complications: Secondary | ICD-10-CM

## 2018-03-12 DIAGNOSIS — E0800 Diabetes mellitus due to underlying condition with hyperosmolarity without nonketotic hyperglycemic-hyperosmolar coma (NKHHC): Secondary | ICD-10-CM

## 2018-03-12 DIAGNOSIS — Z794 Long term (current) use of insulin: Secondary | ICD-10-CM

## 2018-03-12 LAB — POCT GLYCOSYLATED HEMOGLOBIN (HGB A1C): HEMOGLOBIN A1C: 8

## 2018-03-12 MED ORDER — INSULIN ASPART 100 UNIT/ML ~~LOC~~ SOLN: 3.0000 [IU] | Freq: Three times a day (TID) | SUBCUTANEOUS | 6 refills | Status: DC

## 2018-03-12 MED ORDER — INSULIN GLARGINE 100 UNIT/ML ~~LOC~~ SOLN: 7.0000 [IU] | Freq: Every day | SUBCUTANEOUS | 2 refills | Status: DC

## 2018-03-12 NOTE — Progress Notes (Signed)
   Subjective:    [name] - 62 y.o. female MRN [id number]  Date of birth: Sep 17, 1956  HPI  [name] is here for follow up of her T2DM. Patient has been taking Lantus 7 units daily, Novolog 3 units with meals, and Metformin 1000 mg BID. She does not have glucose log today but reports fasting CBGs have consistently been less than 200. She is trying to keep up a good walking schedule. Denies polyuria, vision changes, nausea/vomiting.       -  reports that she has never smoked. She quit smokeless tobacco use about 5 years ago. Her smokeless tobacco use included snuff. - Review of Systems: Per HPI. - Past Medical History: Patient Active Problem List   Diagnosis Date Noted  . Chronic pain 09/14/2017  . Pain and swelling of right knee 07/06/2016  . Low back pain 05/06/2013  . Diabetes mellitus (HCC) 10/15/2012  . Cervical pain (neck) 03/14/2012  . Insomnia 10/10/2011  . Hallux rigidus of right foot 08/29/2011  . CATARACTS 12/16/2009  . SMOKELESS TOBACCO ABUSE 05/14/2008  . OBESITY 11/30/2007  . DIVERTICULOSIS, COLON W/HEM 08/15/2007  . History of TIA (transient ischemic attack) 02/12/2007  . Hyperlipidemia associated with type 2 diabetes mellitus (HCC) 01/11/2007  . Iron deficiency anemia 01/11/2007  . HYPERTENSION, BENIGN SYSTEMIC 01/11/2007   - Medications: reviewed and updated   Objective:   Physical Exam BP 132/68   Pulse 86   Temp 98.4 F (36.9 C) (Oral)   Ht  (1.626 m)   Wt 182 lb 6.4 oz (82.7 kg)   SpO2 97%   BMI 31.31 kg/m  Gen: NAD, alert, cooperative with exam, well-appearing Psych: good insight, alert and oriented  Assessment & Plan:   Diabetes mellitus Improvement in A1c from 10.1% to 8.0%. Suspect transient elevation in blood glucose was at least partially attributable to stressor of the death of her brother. Value of 8.0% is much more consistent with more recent glucose trends over the past year. Continue current medication regimen as  patient has been very sensitive to increases in insulin with episodes of hypoglycemia. Encouraged continued healthy lifestyle choices. Return in 3 months for A1c and review of glucose log.     Marcy Siren, D.O. 03/21/2018, 10:35 AM PGY-3, [facility]

## 2018-03-12 NOTE — Patient Instructions (Addendum)
[name] looks much better today with a value of 8.0. Keep up the good work! Please continue to check your blood sugars and write these numbers down. Bring them to your next appointment. I have refilled your Lantus and Novolog.   Have fun at the wedding!   Take Care,  Dr. Earlene Plater

## 2018-03-21 NOTE — Assessment & Plan Note (Signed)
Improvement in A1c from 10.1% to 8.0%. Suspect transient elevation in blood glucose was at least partially attributable to stressor of the death of her brother. Value of 8.0% is much more consistent with more recent glucose trends over the past year. Continue current medication regimen as patient has been very sensitive to increases in insulin with episodes of hypoglycemia. Encouraged continued healthy lifestyle choices. Return in 3 months for A1c and review of glucose log.

## 2018-04-03 ENCOUNTER — Other Ambulatory Visit: Payer: Self-pay | Admitting: Internal Medicine

## 2018-04-03 ENCOUNTER — Telehealth: Payer: Self-pay | Admitting: Internal Medicine

## 2018-04-03 DIAGNOSIS — G4700 Insomnia, unspecified: Secondary | ICD-10-CM

## 2018-04-03 MED ORDER — TRAMADOL HCL 50 MG PO TABS: 50.0000 mg | ORAL_TABLET | Freq: Three times a day (TID) | ORAL | 1 refills | Status: DC | PRN

## 2018-04-03 MED ORDER — ZOLPIDEM TARTRATE 10 MG PO TABS: ORAL_TABLET | ORAL | 1 refills | Status: DC

## 2018-04-03 MED FILL — ZOLPIDEM TARTRATE 10 MG TAB: 10 | 30 days supply | Qty: 30 | Fill #0

## 2018-04-03 MED FILL — traMADol HCL 50 MG TABS: 50 | 10 days supply | Qty: 30 | Fill #0

## 2018-04-03 NOTE — Progress Notes (Signed)
Have sent refills for Ambien and Tramadol to the pharmacy.   Marcy Siren, D.O. 04/03/2018, 1:42 PM PGY-3, [facility]

## 2018-04-03 NOTE — Telephone Encounter (Signed)
See orders only encounter. Fleeger, Jessica Dawn, CMA  

## 2018-04-03 NOTE — Telephone Encounter (Signed)
Pt needs refill on her tramadol and Ambien sent to the outpatient pharmacy. Please advise

## 2018-04-12 ENCOUNTER — Ambulatory Visit
Admission: RE | Admit: 2018-04-12 | Discharge: 2018-04-12 | Disposition: A | Discharge status: Home / Self Care | Payer: No Typology Code available for payment source | Source: Ambulatory Visit | Attending: Family Medicine | Admitting: Family Medicine

## 2018-04-12 DIAGNOSIS — Z1231 Encounter for screening mammogram for malignant neoplasm of breast: Secondary | ICD-10-CM

## 2018-04-23 ENCOUNTER — Other Ambulatory Visit: Payer: Self-pay | Admitting: Internal Medicine

## 2018-04-23 DIAGNOSIS — Z794 Long term (current) use of insulin: Principal | ICD-10-CM

## 2018-04-23 DIAGNOSIS — E119 Type 2 diabetes mellitus without complications: Secondary | ICD-10-CM

## 2018-04-24 ENCOUNTER — Other Ambulatory Visit: Payer: Self-pay

## 2018-04-26 MED ORDER — ATORVASTATIN CALCIUM 40 MG PO TABS: 40.0000 mg | ORAL_TABLET | Freq: Every day | ORAL | 3 refills | Status: DC

## 2018-05-01 MED FILL — traMADol HCL 50 MG TABS: 50 | 10 days supply | Qty: 30 | Fill #1

## 2018-05-01 MED FILL — ZOLPIDEM TARTRATE 10 MG TAB: 10 | 30 days supply | Qty: 30 | Fill #1

## 2018-05-16 ENCOUNTER — Other Ambulatory Visit: Payer: Self-pay

## 2018-05-16 DIAGNOSIS — I10 Essential (primary) hypertension: Secondary | ICD-10-CM

## 2018-05-17 MED ORDER — LISINOPRIL 20 MG PO TABS: 20.0000 mg | ORAL_TABLET | Freq: Every day | ORAL | 3 refills | Status: DC

## 2018-05-31 ENCOUNTER — Other Ambulatory Visit: Payer: Self-pay

## 2018-05-31 DIAGNOSIS — G4700 Insomnia, unspecified: Secondary | ICD-10-CM

## 2018-05-31 MED ORDER — TRAMADOL HCL 50 MG PO TABS: 50.0000 mg | ORAL_TABLET | Freq: Three times a day (TID) | ORAL | 1 refills | Status: DC | PRN

## 2018-05-31 MED ORDER — ZOLPIDEM TARTRATE 10 MG PO TABS: ORAL_TABLET | ORAL | 1 refills | Status: DC

## 2018-05-31 MED FILL — ZOLPIDEM TARTRATE 10 MG TAB: 10 | 30 days supply | Qty: 30 | Fill #0

## 2018-05-31 MED FILL — traMADol HCL 50 MG TABS: 50 | 10 days supply | Qty: 30 | Fill #0

## 2018-06-04 ENCOUNTER — Other Ambulatory Visit: Payer: Self-pay

## 2018-06-05 MED ORDER — INSULIN GLARGINE 100 UNIT/ML ~~LOC~~ SOLN: 7.0000 [IU] | Freq: Every day | SUBCUTANEOUS | 2 refills | Status: DC

## 2018-06-12 ENCOUNTER — Other Ambulatory Visit: Payer: Self-pay

## 2018-06-12 ENCOUNTER — Encounter: Payer: Self-pay | Admitting: Family Medicine

## 2018-06-12 ENCOUNTER — Ambulatory Visit (INDEPENDENT_AMBULATORY_CARE_PROVIDER_SITE_OTHER): Payer: Self-pay | Admitting: Family Medicine

## 2018-06-12 VITALS — BP 172/90 | HR 77 | Temp 99.9°F | Ht 64.0 in | Wt 183.2 lb

## 2018-06-12 DIAGNOSIS — Z794 Long term (current) use of insulin: Secondary | ICD-10-CM

## 2018-06-12 DIAGNOSIS — E119 Type 2 diabetes mellitus without complications: Secondary | ICD-10-CM

## 2018-06-12 LAB — POCT GLYCOSYLATED HEMOGLOBIN (HGB A1C): HBA1C, POC (CONTROLLED DIABETIC RANGE): 7.5 % — AB (ref 0.0–7.0)

## 2018-06-12 NOTE — Patient Instructions (Signed)
It was a pleasure to see you today! Thank you for choosing [facility] for your primary care. [name] was seen for question about medications. Come back to the clinic if you have any new concerns, and go to the emergency room if you have life threatening problems.  Today we talked about your next month of insulin coming in a few days late.  If it's just a few days you can keep using the prior months insulin without any problems.  Keep checking your blood sugars as normal and if they get over 300 call us since your normal is between 100 and 200.    If we did any lab work today that did not result today, one of two things will happen.  1. If everything is normal, you will get a letter in mail sent to the address in your chart with the results for your records.  It is important to keep your address up to date as that is where we will send results.  2. If the results require some sort of discussion, my nurses or myself will call you on the phone number listed in your records.  It is important to keep your phone number up to date in our system as this is how we will try to reach you.  If we cannot reach you on the phone, we will try to send you a letter in the mail so please enable to voicemail function of your phone.  If you don't hear from us in two weeks, please give us a call to verify your results. Otherwise, we look forward to seeing you again at your next visit. If you have any questions or concerns before then, please call the clinic at [phone].   Please bring all your medications to every doctors visit   Sign up for My Chart to have easy access to your labs results, and communication with your Primary care physician.     Please check-out at the front desk before leaving the clinic.     Best,  Dr. Marthenia RollingScott Akima Slaugh FAMILY MEDICINE RESIDENT - PGY2 06/12/2018 3:17 PM

## 2018-06-13 ENCOUNTER — Other Ambulatory Visit: Payer: Self-pay

## 2018-06-13 MED ORDER — INSULIN GLARGINE 100 UNIT/ML ~~LOC~~ SOLN: 7.0000 [IU] | Freq: Every day | SUBCUTANEOUS | 2 refills | Status: DC

## 2018-06-14 NOTE — Assessment & Plan Note (Signed)
A1C improved, has been without her meds for 2 days and her insulin just expired so she hasn't taken any today.  She should have her replacement insulin in 2 days.  We discussed to continue  Checking her sugars and take the insulin since it's only a few days over date, if her sugars go over 250 she should call the clinic and come in to be seen.

## 2018-06-14 NOTE — Progress Notes (Signed)
    Subjective:  [name] is a 62 y.o. female who [facility] with a chief complaint of DM checkup.   HPI: She says she has been trying to eat better and was optimistic she would be doing well.  She has no new eye/foot/urinary complaints and is otherwise feeling well.  She says she has been taking her meds as prescribed until she had a miscommunication with her mail order pharmacy and has been without replacement meds for 2 days.   Objective:  Physical Exam: BP (!) 172/90   Pulse 77   Temp 99.9 F (37.7 C) (Oral)   Ht 5\' 4"  (1.626 m)   Wt 183 lb 3.2 oz (83.1 kg)   SpO2 95%   BMI 31.45 kg/m   Gen: NAD, sitting comfortably CV: RRR with no murmurs appreciated Pulm: NWOB, CTAB with no crackles, wheezes, or rhonchi GI: Soft, Nontender, Nondistended. MSK: no edema, cyanosis, or clubbing noted Skin: warm, dry Neuro: grossly normal, moves all extremities Psych: Normal affect and thought content  Results for orders placed or performed in visit on 06/12/18 (from the past 72 hour(s))  POCT glycosylated hemoglobin (Hb A1C)     Status: Abnormal   Collection Time: 06/12/18  2:42 PM  Result Value Ref Range   Hemoglobin A1C  4.0 - 5.6 %   HbA1c POC (<> result, manual entry)  4.0 - 5.6 %   HbA1c, POC (prediabetic range)  5.7 - 6.4 %   HbA1c, POC (controlled diabetic range) 7.5 (A) 0.0 - 7.0 %     Assessment/Plan:  Diabetes mellitus A1C improved, has been without her meds for 2 days and her insulin just expired so she hasn't taken any today.  She should have her replacement insulin in 2 days.  We discussed to continue  Checking her sugars and take the insulin since it's only a few days over date, if her sugars go over 250 she should call the clinic and come in to be seen.   Marthenia RollingScott Myalynn Lingle, DO FAMILY MEDICINE RESIDENT - PGY2 06/14/2018 9:58 PM

## 2018-07-02 MED FILL — ZOLPIDEM TARTRATE 10 MG TAB: 10 | 30 days supply | Qty: 30 | Fill #1

## 2018-07-02 MED FILL — traMADol HCL 50 MG TABS: 50 | 10 days supply | Qty: 30 | Fill #1

## 2018-07-04 ENCOUNTER — Ambulatory Visit: Payer: Self-pay

## 2018-07-27 ENCOUNTER — Other Ambulatory Visit: Payer: Self-pay

## 2018-07-27 ENCOUNTER — Encounter: Payer: Self-pay | Admitting: Family Medicine

## 2018-07-27 ENCOUNTER — Ambulatory Visit (INDEPENDENT_AMBULATORY_CARE_PROVIDER_SITE_OTHER): Payer: Self-pay | Admitting: Family Medicine

## 2018-07-27 DIAGNOSIS — M25561 Pain in right knee: Secondary | ICD-10-CM

## 2018-07-27 DIAGNOSIS — M7918 Myalgia, other site: Secondary | ICD-10-CM

## 2018-07-27 MED ORDER — CYCLOBENZAPRINE HCL 10 MG PO TABS: 10.0000 mg | ORAL_TABLET | Freq: Three times a day (TID) | ORAL | 0 refills | Status: DC | PRN

## 2018-07-27 MED ORDER — MELOXICAM 15 MG PO TABS: 15.0000 mg | ORAL_TABLET | Freq: Every day | ORAL | 0 refills | Status: DC

## 2018-07-27 MED FILL — MELOXICAM 15 MG TABLET: 15 | 7 days supply | Qty: 7 | Fill #0

## 2018-07-27 MED FILL — CYCLOBENZAPRINE 10 MG TAB: 10 | 10 days supply | Qty: 30 | Fill #0

## 2018-07-27 NOTE — Assessment & Plan Note (Signed)
Considered osteoarthritis of the hip vs sciatica vs greater trochanteric bursitis vs gluteus strain. Osteoarthritis possible but she is not complaining of groin pain and no pain illicited on exam with external and internal rotation of the hip. Sciatica less likely as it does not get worse with compression (like sitting), no numbness, tingling, and is not radiating to the knee. 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  - Rest, ice, heating pad, and OTC muscle cream - Flexeril for 10 days, Meloxicam for 7 days for pain and inflammation - Return to clinic if no improvement or worsening symptoms after 3-4 days.

## 2018-07-27 NOTE — Progress Notes (Signed)
Subjective: Chief Complaint  Patient presents with  . left butt pain     HPI: [name] is a 62 y.o. presenting to clinic today to discuss the following:  Left Leg/Buttock Pain Patient states she got up this morning to go to the bathroom and on getting up and walking she felt a pain in her left buttock that radiates down the back of her left leg but does not reach her knee. She states it is a 8/10 and is relieved by sitting. She has not tried taking anything for it and has never had a pain like this before. She described the pain as a "toothache" and had no inciting injury or fall. She is not having any back pain at this time. No numbness, tingling, or burning today.  She denies fever, chills, abdominal pain, nausea, vomiting, loss of sensation in her groin, loss of bowel or bladder control.    Health Maintenance: none     ROS noted in HPI.   Past Medical, Surgical, Social, and Family History Reviewed & Updated per EMR.   Pertinent Historical Findings include:   Social History   Tobacco Use  Smoking Status Never Smoker  Smokeless Tobacco Former NeurosurgeonUser  . Types: Snuff  Tobacco Comment   Quit for 1.5 months in past. Current quit date 01/28/2013      Objective: BP (!) 168/88   Pulse 87   Temp 98.5 F (36.9 C) (Oral)   Ht 5\' 4"  (1.626 m)   Wt 180 lb 9.6 oz (81.9 kg)   SpO2 97%   BMI 31.00 kg/m  Vitals and nursing notes reviewed  Physical Exam  Gen: Alert and Oriented x 3, NAD CV: RRR, no murmurs, normal S1, S2 split Resp: CTAB, no wheezing, rales, or rhonchi, comfortable work of breathing MSK: Moves all four extremities; Bilateral hip non-tender to palpation at greater trochanteric bursa, FABER test negative bilaterally, negative straight leg raise bilaterally, Full ROM in both hips, TTP at the lower ischial tuberosity on the left. Ext: no clubbing, cyanosis, or edema Neuro: No gross deficits; gross sensation intact in both LE, 5/5 strength in LE Skin:  warm, dry, intact, no rashes   No results found for this or any previous visit (from the past 72 hour(s)).  Assessment/Plan:  Left buttock pain Considered osteoarthritis of the hip vs sciatica vs greater trochanteric bursitis vs gluteus strain. Osteoarthritis possible but she is not complaining of groin pain and no pain illicited on exam with external and internal rotation of the hip. Sciatica less likely as it does not get worse with compression (like sitting), no numbness, tingling, and is not radiating to the knee. 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  - Rest, ice, heating pad, and OTC muscle cream - Flexeril for 10 days, Meloxicam for 7 days for pain and inflammation - Return to clinic if no improvement or worsening symptoms after 3-4 days.    PATIENT EDUCATION PROVIDED: See AVS    Diagnosis and plan along with any newly prescribed medication(s) were discussed in detail with this patient today. The patient verbalized understanding and agreed with the plan. Patient advised if symptoms worsen return to clinic or ER.   Health Maintainance:   No orders of the defined types were placed in this encounter.   Meds ordered this encounter  Medications  . cyclobenzaprine (FLEXERIL) 10 MG tablet    Sig: Take 1  tablet (10 mg total) by mouth 3 (three) times daily as needed for muscle spasms.    Dispense:  30 tablet    Refill:  0  . meloxicam (MOBIC) 15 MG tablet    Sig: Take 1 tablet (15 mg total) by mouth daily.    Dispense:  7 tablet    Refill:  0     Jules Schick, DO 07/27/2018, 2:19 PM PGY-2 [facility]

## 2018-07-27 NOTE — Patient Instructions (Addendum)
It was great to meet you today! Thank you for letting me participate in your care!  Today, we discussed your left buttock pain. I believe you liked pulled a muscle in that area, most likely the gluteus medius muscle. I recommend you rest, and use a heating pad at night. You can also use OTC muscle cream and Tylenol as needed.  I have sent a prescription for Flexeril to the pharmacy. This medication is a muscle relaxer and should also help bring some relief. You can take it up to three times per day. I have also written you a prescription for Meloxicam for pain and inflammation. Please take it once a day.   Gluteal Strain A gluteal strain happens when the muscles in the buttocks (gluteal muscles) are overstretched or torn. A tear can be partial or complete. A gluteal strain can cause pain and stiffness in your buttocks, legs, and lower back. A strain might be referred to as "pulling a muscle." The severity of a gluteal strain is rated in degrees. First-degree strains have the least amount of muscle tearing and pain. Second-degree and third-degrees strains have increasingly more tearing and pain. What are the causes? There are many possible causes of gluteal strain, such as:  Stretching the muscles too far.  Putting too much stress on the muscles before they are warmed up.  Overusing the muscles.  Repetitive muscle movements over long periods of time.  Injury.  What increases the risk? This condition is more likely to develop in:  People who are in cold weather.  People who are physically tired.  People with poor strength and flexibility.  People who do not warm up properly before physical activity.  People who do exercises or play sports with sudden bursts of activity.  People who have poor exercise technique.  What are the signs or symptoms? Symptoms of this condition include:  Pain in the buttocks, especially when moving the legs. Pain may spread to the lower back or the  legs.  Bruising and swelling of the buttocks.  Tenderness, weakness, or stiffness in the buttocks.  Muscle spasms.  How is this diagnosed? This condition is diagnosed based on a physical exam and medical history. Your health care provider may do some range of motion exercises with you. You may have tests, such as MRI or X-rays. Your strain may be rated based on how severe it is. The ratings are:  Grade 1 strain (mild). Your muscles are overstretched. You might have very small muscle tears. This type of strain generally heals in about 1 week.  Grade 2 strain (moderate). Your muscles are partially torn. This may take 1 to 2 months to heal.  Grade 3 strain (severe). Your muscles are completely torn. A severe strain can take more than three months to heal. Grade 3 gluteal strains are rare.  How is this treated? Treatment for this condition includes resting, icing, and raising (elevating) the injured area as much as possible. Your health care provider may recommend over-the-counter pain medicines. If your gluteal strain is severe or very painful, your health care provider may prescribe pain medicines or physical therapy. Surgery for severe strains is rare. Follow these instructions at home:  Take over-the-counter and prescription medicines only as told by your health care provider.  Do not drive or operate heavy machinery while taking prescription pain medicine.  Return to your normal activities as told by your health care provider. Ask your health care provider what activities are safe for you. ?  Rest your gluteal muscles as much as possible, especially for the first 2-3 days. ? Begin exercising or stretching as told by your health care provider.  If directed, apply ice to the injured area: ? Put ice in a plastic bag. ? Place a towel between your skin and the bag. ? Leave the ice on for 20 minutes, 2-3 times per day.  Keep all follow-up visits as told by your health care provider. This  is important. How is this prevented?  Warm up and stretch before physical activity.  Stretch after physical activity.  Learn and use correct techniques for exercising and playing sports.  Avoid difficult physical activities if your muscles are tired or sore.  Strengthen your gluteal muscles and the surrounding muscles.  Develop your flexibility by stretching at least once a day. Contact a health care provider if:  You have pain or swelling that gets worse or does not get better with medicine.  You have "shooting" pain that moves through your leg. Get help right away if:  You develop weakness in part of your body.  You lose feeling in part of your body.  You have severe pain.  You are unable to walk.  You have difficulty controlling your bladder or bowel movements. This information is not intended to replace advice given to you by your health care provider. Make sure you discuss any questions you have with your health care provider. Document Released: 08/28/2009 Document Revised: 04/07/2016 Document Reviewed: 03/18/2015 Elsevier Interactive Patient Education  2018 [employer]   Be well, Jules Schickim Requan Hardge, DO PGY-2, [facility]

## 2018-07-30 ENCOUNTER — Other Ambulatory Visit: Payer: Self-pay

## 2018-07-30 DIAGNOSIS — E119 Type 2 diabetes mellitus without complications: Secondary | ICD-10-CM

## 2018-07-30 DIAGNOSIS — Z794 Long term (current) use of insulin: Principal | ICD-10-CM

## 2018-07-30 MED ORDER — METFORMIN HCL 1000 MG PO TABS: 1000.0000 mg | ORAL_TABLET | Freq: Two times a day (BID) | ORAL | 5 refills | Status: DC

## 2018-08-03 ENCOUNTER — Other Ambulatory Visit: Payer: Self-pay | Admitting: Family Medicine

## 2018-08-03 DIAGNOSIS — G4700 Insomnia, unspecified: Secondary | ICD-10-CM

## 2018-08-06 MED FILL — ZOLPIDEM TARTRATE 10 MG TAB: 10 | 30 days supply | Qty: 30 | Fill #0

## 2018-08-06 MED FILL — traMADol HCL 50 MG TABS: 50 | 10 days supply | Qty: 30 | Fill #0

## 2018-08-29 ENCOUNTER — Other Ambulatory Visit: Payer: Self-pay

## 2018-08-30 MED ORDER — INSULIN GLARGINE 100 UNIT/ML ~~LOC~~ SOLN: 7.0000 [IU] | Freq: Every day | SUBCUTANEOUS | 2 refills | Status: DC

## 2018-09-03 MED FILL — ZOLPIDEM TARTRATE 10 MG TAB: 10 | 30 days supply | Qty: 30 | Fill #1

## 2018-09-03 MED FILL — traMADol HCL 50 MG TABS: 50 | 10 days supply | Qty: 30 | Fill #1

## 2018-09-18 ENCOUNTER — Encounter: Payer: Self-pay | Admitting: Family Medicine

## 2018-09-18 ENCOUNTER — Other Ambulatory Visit: Payer: Self-pay

## 2018-09-18 ENCOUNTER — Ambulatory Visit (INDEPENDENT_AMBULATORY_CARE_PROVIDER_SITE_OTHER): Payer: Self-pay | Admitting: Family Medicine

## 2018-09-18 VITALS — BP 142/60 | HR 87 | Temp 99.1°F | Ht 64.0 in | Wt 180.6 lb

## 2018-09-18 DIAGNOSIS — E119 Type 2 diabetes mellitus without complications: Secondary | ICD-10-CM

## 2018-09-18 DIAGNOSIS — Z794 Long term (current) use of insulin: Secondary | ICD-10-CM

## 2018-09-18 DIAGNOSIS — Z1211 Encounter for screening for malignant neoplasm of colon: Secondary | ICD-10-CM

## 2018-09-18 DIAGNOSIS — Z23 Encounter for immunization: Secondary | ICD-10-CM

## 2018-09-18 LAB — POCT GLYCOSYLATED HEMOGLOBIN (HGB A1C): HbA1c, POC (controlled diabetic range): 7.8 % — AB (ref 0.0–7.0)

## 2018-09-18 NOTE — Patient Instructions (Signed)
It was a pleasure to see you today! Thank you for choosing [facility] for your primary care. [name] was seen for A1C and flu shot. Come back to the clinic if you have any routine concerns, and go to the emergency room if you have any life threatening problems.  Today we talked about not increasing your insulin because you have some dangerous lows ~1/month.  We also talked about trying to lower your salt intake to control your high blood pressure instead of starting with more medicine for now.  Please come back in a month to see how you are doing with that.   Please bring all your medications to every doctors visit   Sign up for My Chart to have easy access to your labs results, and communication with your Primary care physician.     Please check-out at the front desk before leaving the clinic.     Best,  Dr. Marthenia Rolling FAMILY MEDICINE RESIDENT - PGY2 09/18/2018 3:57 PM

## 2018-09-18 NOTE — Progress Notes (Signed)
    Subjective:  [name] is a 62 y.o. female who presents to the [facility] today with a chief complaint of DM checkup.   HPI: Patient consents to A1c check (7.8 up from 7.5).  She thought it was going to be higher as she isn't watching her diet well.  She takes basal and meal coverage, does not use sliding scale as it's a lot for her to keep track of.  Has had low blood sugar events to the 40s, they have started happening less frequently but she was found unresponsive by family in the past so she is nervous about chasing a lower A1c.   Patient has not had colonoscopy in >51yrs, no symptoms, consents to referal for screening   Objective:  Physical Exam: BP (!) 142/60   Pulse 87   Temp 99.1 F (37.3 C) (Oral)   Ht 5\' 4"  (1.626 m)   Wt 180 lb 9.6 oz (81.9 kg)   SpO2 99%   BMI 31.00 kg/m   Gen: NAD, resting comfortably CV: RRR with no murmurs appreciated Pulm: NWOB, CTAB with no crackles, wheezes, or rhonchi GI: Normal bowel sounds present. Soft, Nontender, Nondistended. MSK: no edema, cyanosis, or clubbing noted Skin: warm, dry Neuro: grossly normal, moves all extremities Psych: Normal affect and thought content  No results found for this or any previous visit (from the past 72 hour(s)).   Assessment/Plan:  Diabetes mellitus (HCC) No current change in plan due to hypoglycemic hx we will not add insulin.   Patient instructed that she has to eat if she takes her mealtime insulin   Screen for colon cancer referal to GI for screening, no symptoms at this time   Marthenia Rolling, DO FAMILY MEDICINE RESIDENT - PGY2 09/21/2018 6:32 PM

## 2018-09-21 DIAGNOSIS — Z1211 Encounter for screening for malignant neoplasm of colon: Secondary | ICD-10-CM | POA: Insufficient documentation

## 2018-09-21 NOTE — Assessment & Plan Note (Signed)
referal to GI for screening, no symptoms at this time

## 2018-09-21 NOTE — Assessment & Plan Note (Signed)
No current change in plan due to hypoglycemic hx we will not add insulin.   Patient instructed that she has to eat if she takes her mealtime insulin

## 2018-09-26 ENCOUNTER — Other Ambulatory Visit: Payer: Self-pay

## 2018-09-26 MED ORDER — INSULIN ASPART 100 UNIT/ML ~~LOC~~ SOLN: 3.0000 [IU] | Freq: Three times a day (TID) | SUBCUTANEOUS | 6 refills | Status: DC

## 2018-09-27 ENCOUNTER — Encounter: Payer: Self-pay | Admitting: Internal Medicine

## 2018-10-03 ENCOUNTER — Other Ambulatory Visit: Payer: Self-pay | Admitting: Family Medicine

## 2018-10-03 DIAGNOSIS — G4700 Insomnia, unspecified: Secondary | ICD-10-CM

## 2018-10-03 MED FILL — traMADol HCL 50 MG TABS: 50 | 10 days supply | Qty: 30 | Fill #0

## 2018-10-03 MED FILL — ZOLPIDEM TARTRATE 10 MG TAB: 10 | 30 days supply | Qty: 30 | Fill #0

## 2018-10-18 ENCOUNTER — Ambulatory Visit: Payer: Self-pay

## 2018-10-23 ENCOUNTER — Ambulatory Visit: Payer: Self-pay

## 2018-10-23 VITALS — BP 142/60

## 2018-10-23 DIAGNOSIS — I10 Essential (primary) hypertension: Secondary | ICD-10-CM

## 2018-10-23 NOTE — Addendum Note (Signed)
Addended by: Marthenia RollingBLAND, Zemirah Krasinski R on: 10/23/2018 12:24 PM   Modules accepted: Orders

## 2018-10-23 NOTE — Progress Notes (Signed)
Called patient about continued HTN.  We will check BMP in a lab visit, then increase lisinopril to 40 with recheck in ~2wk.  -Dr. Parke SimmersBland

## 2018-10-23 NOTE — Progress Notes (Signed)
Pt presents in nurse clinic for BP check.   BP today 142/60 checked in R arm with large cuff.   Pt last took her BP meds this morning at 830am. Lisinopril 20mg  and Hydrochlorothiazide 25mg .  Symptoms present: none   Will route note to PCP.

## 2018-10-25 ENCOUNTER — Other Ambulatory Visit: Payer: Self-pay | Admitting: Family Medicine

## 2018-10-25 ENCOUNTER — Other Ambulatory Visit: Payer: Self-pay

## 2018-10-25 DIAGNOSIS — I10 Essential (primary) hypertension: Secondary | ICD-10-CM

## 2018-10-26 ENCOUNTER — Other Ambulatory Visit: Payer: Self-pay | Admitting: Family Medicine

## 2018-10-26 DIAGNOSIS — I10 Essential (primary) hypertension: Secondary | ICD-10-CM

## 2018-10-26 LAB — BASIC METABOLIC PANEL
BUN/Creatinine Ratio: 15 (ref 12–28)
BUN: 16 mg/dL (ref 8–27)
CO2: 24 mmol/L (ref 20–29)
Calcium: 10.2 mg/dL (ref 8.7–10.3)
Chloride: 98 mmol/L (ref 96–106)
Creatinine, Ser: 1.04 mg/dL — ABNORMAL HIGH (ref 0.57–1.00)
GFR calc Af Amer: 67 mL/min/{1.73_m2} (ref >59)
GFR calc non Af Amer: 58 mL/min/{1.73_m2} — ABNORMAL LOW (ref >59)
GLUCOSE: 96 mg/dL (ref 65–99)
Potassium: 4.7 mmol/L (ref 3.5–5.2)
Sodium: 142 mmol/L (ref 134–144)

## 2018-10-26 MED ORDER — LISINOPRIL 20 MG PO TABS: 40.0000 mg | ORAL_TABLET | Freq: Every day | ORAL | 0 refills | Status: DC

## 2018-10-26 MED FILL — LISINOPRIL 20 MG TABLET: 20 | 15 days supply | Qty: 30 | Fill #0

## 2018-10-26 NOTE — Progress Notes (Signed)
Lisinopril increased to 40.  Patient needs recheck of BMP ~2wks.  They are aware of plan.  -Dr. Parke SimmersBland

## 2018-10-31 MED FILL — traMADol HCL 50 MG TABS: 50 | 10 days supply | Qty: 30 | Fill #1

## 2018-11-01 ENCOUNTER — Telehealth: Payer: Self-pay

## 2018-11-01 ENCOUNTER — Ambulatory Visit (AMBULATORY_SURGERY_CENTER): Payer: Self-pay

## 2018-11-01 VITALS — Ht 64.0 in | Wt 178.8 lb

## 2018-11-01 DIAGNOSIS — Z1211 Encounter for screening for malignant neoplasm of colon: Secondary | ICD-10-CM

## 2018-11-01 MED ORDER — PEG-KCL-NACL-NASULF-NA ASC-C 140 G PO SOLR: 1.0000 | Freq: Once | ORAL | 0 refills | Status: AC

## 2018-11-01 NOTE — Telephone Encounter (Signed)
Patient had a PV today. [name] states that she chewes snuff. I forgot to go back and add it to her prep instructions. I called the patient and informed her not to use snuff the day before her procedure or the day of her procedure. Patient was told that it would be considered food and they would not do her procedure. Patient verbalizes understanding and said that she will write it on the instructions as a reminder.   Monica DaneNancy Mills Mitton, LPN ( PV )

## 2018-11-01 NOTE — Progress Notes (Signed)
Denies allergies to eggs or soy products. Denies complication of anesthesia or sedation. Denies use of weight loss medication. Denies use of O2.   Emmi instructions declined.   A sample of Plenvu was given to the patient. Patient verbalizes understanding for instructions.

## 2018-11-09 ENCOUNTER — Other Ambulatory Visit: Payer: Self-pay

## 2018-11-11 MED ORDER — HYDROCHLOROTHIAZIDE 25 MG PO TABS: 12.5000 mg | ORAL_TABLET | Freq: Every day | ORAL | 3 refills | Status: DC

## 2018-11-15 ENCOUNTER — Encounter: Payer: Self-pay | Admitting: Internal Medicine

## 2018-11-15 ENCOUNTER — Ambulatory Visit (AMBULATORY_SURGERY_CENTER): Payer: Self-pay | Admitting: Internal Medicine

## 2018-11-15 VITALS — BP 158/84 | HR 86 | Temp 97.5°F | Resp 17

## 2018-11-15 DIAGNOSIS — Z1211 Encounter for screening for malignant neoplasm of colon: Secondary | ICD-10-CM

## 2018-11-15 MED ORDER — SODIUM CHLORIDE 0.9 % IV SOLN: 500.0000 mL | Freq: Once | INTRAVENOUS | Status: DC

## 2018-11-15 NOTE — Patient Instructions (Signed)
Impression/Recommendations:  Diverticulosis handout given to patient.  Repeat colonoscopy in 10 years for screening.  Resume previous diet. Continue present medications.  YOU HAD AN ENDOSCOPIC PROCEDURE TODAY AT THE [facility]:   Refer to the procedure report that was given to you for any specific questions about what was found during the examination.  If the procedure report does not answer your questions, please call your gastroenterologist to clarify.  If you requested that your care partner not be given the details of your procedure findings, then the procedure report has been included in a sealed envelope for you to review at your convenience later.  YOU SHOULD EXPECT: Some feelings of bloating in the abdomen. Passage of more gas than usual.  Walking can help get rid of the air that was put into your GI tract during the procedure and reduce the bloating. If you had a lower endoscopy (such as a colonoscopy or flexible sigmoidoscopy) you may notice spotting of blood in your stool or on the toilet paper. If you underwent a bowel prep for your procedure, you may not have a normal bowel movement for a few days.  Please Note:  You might notice some irritation and congestion in your nose or some drainage.  This is from the oxygen used during your procedure.  There is no need for concern and it should clear up in a day or so.  SYMPTOMS TO REPORT IMMEDIATELY:   Following lower endoscopy (colonoscopy or flexible sigmoidoscopy):  Excessive amounts of blood in the stool  Significant tenderness or worsening of abdominal pains  Swelling of the abdomen that is new, acute  Fever of 100F or higher  For urgent or emergent issues, a gastroenterologist can be reached at any hour by calling [phone].   DIET:  We do recommend a small meal at first, but then you may proceed to your regular diet.  Drink plenty of fluids but you should avoid alcoholic beverages for 24 hours.  ACTIVITY:   You should plan to take it easy for the rest of today and you should NOT DRIVE or use heavy machinery until tomorrow (because of the sedation medicines used during the test).    FOLLOW UP: Our staff will call the number listed on your records the next business day following your procedure to check on you and address any questions or concerns that you may have regarding the information given to you following your procedure. If we do not reach you, we will leave a message.  However, if you are feeling well and you are not experiencing any problems, there is no need to return our call.  We will assume that you have returned to your regular daily activities without incident.  If any biopsies were taken you will be contacted by phone or by letter within the next 1-3 weeks.  Please call us at [phone] if you have not heard about the biopsies in 3 weeks.    SIGNATURES/CONFIDENTIALITY: You and/or your care partner have signed paperwork which will be entered into your electronic medical record.  These signatures attest to the fact that that the information above on your After Visit Summary has been reviewed and is understood.  Full responsibility of the confidentiality of this discharge information lies with you and/or your care-partner. 

## 2018-11-15 NOTE — Progress Notes (Signed)
PT taken to PACU. Monitors in place. VSS. Report given to RN. 

## 2018-11-15 NOTE — Progress Notes (Signed)
Pt's states no medical or surgical changes since previsit or office visit. 

## 2018-11-15 NOTE — Op Note (Signed)
[facility] Patient Name: Monica LeatherRachel Cannata Procedure Date: 11/15/2018 10:24 AM MRN: [id number] Endoscopist: Wilhemina BonitoJohn N. Marina GoodellPerry , MD Age: [age] Referring MD:  Date of Birth: [birth year] Gender: Female Account #: [id number] Procedure:                Colonoscopy Indications:              Screening for colorectal malignant neoplasm Medicines:                Monitored Anesthesia Care Procedure:                Pre-Anesthesia Assessment:                           - Prior to the procedure, a History and Physical                            was performed, and patient medications and                            allergies were reviewed. The patient's tolerance of                            previous anesthesia was also reviewed. The risks                            and benefits of the procedure and the sedation                            options and risks were discussed with the patient.                            All questions were answered, and informed consent                            was obtained. Prior Anticoagulants: The patient has                            taken no previous anticoagulant or antiplatelet                            agents. ASA Grade Assessment: II - A patient with                            mild systemic disease. After reviewing the risks                            and benefits, the patient was deemed in                            satisfactory condition to undergo the procedure.                           After obtaining informed consent, the colonoscope  was passed under direct vision. Throughout the                            procedure, the patient's blood pressure, pulse, and                            oxygen saturations were monitored continuously. The                            Colonoscope was introduced through the anus and                            advanced to the the cecum, identified by                            appendiceal orifice and  ileocecal valve. The                            ileocecal valve, appendiceal orifice, and rectum                            were photographed. The quality of the bowel                            preparation was excellent. The colonoscopy was                            performed without difficulty. The patient tolerated                            the procedure well. The bowel preparation used was                            SUPREP. Scope In: 10:34:49 AM Scope Out: 10:50:06 AM Scope Withdrawal Time: 0 hours 9 minutes 59 seconds  Total Procedure Duration: 0 hours 15 minutes 17 seconds  Findings:                 Multiple diverticula were found in the right colon.                           The exam was otherwise without abnormality on                            direct and retroflexion views. Complications:            No immediate complications. Estimated blood loss:                            None. Estimated Blood Loss:     Estimated blood loss: none. Impression:               - Diverticulosis in the right colon.                           - The examination was otherwise normal on  direct                            and retroflexion views.                           - No specimens collected. Recommendation:           - Repeat colonoscopy in 10 years for screening                            purposes.                           - Patient has a contact number available for                            emergencies. The signs and symptoms of potential                            delayed complications were discussed with the                            patient. Return to normal activities tomorrow.                            Written discharge instructions were provided to the                            patient.                           - Resume previous diet.                           - Continue present medications. Wilhemina Bonito. Marina Goodell, MD 11/15/2018 10:55:06 AM This report has been signed electronically.

## 2018-11-16 ENCOUNTER — Telehealth: Payer: Self-pay

## 2018-11-16 ENCOUNTER — Telehealth: Payer: Self-pay | Admitting: *Deleted

## 2018-11-16 NOTE — Telephone Encounter (Signed)
LVM for pt to call office back to inform her of below and to assist in getting a lab and nurse visit to check the below mentioned items. Lamonte SakaiZimmerman Rumple, Clovia Reine D, [place]

## 2018-11-16 NOTE — Telephone Encounter (Signed)
-----   Message from [place], DO sent at 11/11/2018  6:02 PM EST ----- Regarding: please call patient Patient was supposed to come back in ~2wks for a bmp and blood pressure (can be lab/nurse visit) but they never came in, can we reach out to them and try to schedule it?

## 2018-11-16 NOTE — Telephone Encounter (Signed)
  Follow up Call-  Call back number 11/15/2018  Post procedure Call Back phone  # [phone]  Permission to leave phone message Yes  Some recent data might be hidden     Patient questions:  Do you have a fever, pain , or abdominal swelling? No. Pain Score  0 *  Have you tolerated food without any problems? Yes.    Have you been able to return to your normal activities? Yes.    Do you have any questions about your discharge instructions: Diet   No. Medications  No. Follow up visit  No.  Do you have questions or concerns about your Care? No.  Actions: * If pain score is 4 or above: No action needed, pain <4.

## 2018-11-19 MED FILL — ZOLPIDEM TARTRATE 10 MG TAB: 10 | 30 days supply | Qty: 30 | Fill #1

## 2018-11-23 NOTE — Telephone Encounter (Signed)
Contacted pt and appointment scheduled for lab/nurse visit. [name], [name] D, [name]

## 2018-11-29 ENCOUNTER — Ambulatory Visit (INDEPENDENT_AMBULATORY_CARE_PROVIDER_SITE_OTHER): Payer: Self-pay

## 2018-11-29 ENCOUNTER — Other Ambulatory Visit: Payer: Self-pay

## 2018-11-29 ENCOUNTER — Other Ambulatory Visit: Payer: Self-pay | Admitting: Family Medicine

## 2018-11-29 VITALS — BP 158/80

## 2018-11-29 DIAGNOSIS — I10 Essential (primary) hypertension: Secondary | ICD-10-CM

## 2018-11-29 MED ORDER — LISINOPRIL 20 MG PO TABS: 40.0000 mg | ORAL_TABLET | Freq: Every day | ORAL | 1 refills | Status: DC

## 2018-11-29 MED FILL — traMADol HCL 50 MG TABS: 50 | 10 days supply | Qty: 30 | Fill #0

## 2018-11-29 NOTE — Progress Notes (Signed)
   Patient came in to nurse clinic for BP check and BMP. When asked when she last had her Lisinopril 40 mg and HCTZ 25 mg, patient stated she has been out of Lisinopril x 2 days and needs a refill. She has been taking HCTZ 25 mg daily.  BP 158/80. Advised patient refill request will be sent but she should expect to be advised to come back for a visit with PCP or RN to get an accurate measurement of her BP on both medications. Patient escorted to lab.  Ples SpecterAlisa Brake, RN [facility] RN)

## 2018-11-30 LAB — BASIC METABOLIC PANEL
BUN/Creatinine Ratio: 12 (ref 12–28)
BUN: 12 mg/dL (ref 8–27)
CO2: 24 mmol/L (ref 20–29)
Calcium: 9.9 mg/dL (ref 8.7–10.3)
Chloride: 97 mmol/L (ref 96–106)
Creatinine, Ser: 1.03 mg/dL — ABNORMAL HIGH (ref 0.57–1.00)
GFR calc Af Amer: 68 mL/min/{1.73_m2} (ref >59)
GFR calc non Af Amer: 59 mL/min/{1.73_m2} — ABNORMAL LOW (ref >59)
Glucose: 162 mg/dL — ABNORMAL HIGH (ref 65–99)
Potassium: 4.4 mmol/L (ref 3.5–5.2)
SODIUM: 137 mmol/L (ref 134–144)

## 2018-12-03 ENCOUNTER — Other Ambulatory Visit: Payer: Self-pay | Admitting: Family Medicine

## 2018-12-03 ENCOUNTER — Telehealth: Payer: Self-pay | Admitting: *Deleted

## 2018-12-03 DIAGNOSIS — I10 Essential (primary) hypertension: Secondary | ICD-10-CM

## 2018-12-03 MED ORDER — LISINOPRIL 20 MG PO TABS: 40.0000 mg | ORAL_TABLET | Freq: Every day | ORAL | 1 refills | Status: DC

## 2018-12-03 MED FILL — LISINOPRIL 20 MG TABLET: 20 | 30 days supply | Qty: 60 | Fill #0

## 2018-12-03 NOTE — Telephone Encounter (Signed)
INformed pt of below and she stated that [facility] and she would like [facility] to [facility]. Lamonte SakaiZimmerman Rumple, Bensyn Bornemann D, [place]

## 2018-12-03 NOTE — Telephone Encounter (Signed)
Done

## 2018-12-03 NOTE — Telephone Encounter (Signed)
-----   Message from Ellwood Dense, DO sent at 11/30/2018  2:07 PM EST ----- Creatinine is stable from prior. She should continue her current dose of Lisinopril and follow up with Dr. Parke Simmers in about one month. Please let patient know. Thanks!

## 2018-12-05 ENCOUNTER — Other Ambulatory Visit: Payer: Self-pay | Admitting: Family Medicine

## 2018-12-06 ENCOUNTER — Other Ambulatory Visit: Payer: Self-pay

## 2018-12-06 MED ORDER — MIRTAZAPINE 15 MG PO TABS: 15.0000 mg | ORAL_TABLET | Freq: Every day | ORAL | 5 refills | Status: DC

## 2018-12-11 ENCOUNTER — Telehealth: Payer: Self-pay | Admitting: Family Medicine

## 2018-12-11 ENCOUNTER — Other Ambulatory Visit: Payer: Self-pay | Admitting: Family Medicine

## 2018-12-11 MED ORDER — INSULIN GLARGINE 100 UNIT/ML ~~LOC~~ SOLN: SUBCUTANEOUS | 0 refills | Status: DC

## 2018-12-11 NOTE — Telephone Encounter (Signed)
Pt called and requested a refill on lantus. Please call PT back at [phone]. ad

## 2018-12-11 NOTE — Progress Notes (Signed)
Patient called requesting refill of Lantus.  Unclear from previous notes that she is still taking 7 units a day but as this was the last that was filled will refill current prescription.  Unable to reach patient over the phone and left voicemail for her to call the office back if she had any further questions or concerns.  [place] Kylani Wires, DO PGY-2, [facility]

## 2018-12-11 NOTE — Telephone Encounter (Signed)
Called and left voicemail for patient stating I would refill her lantus and if she has any further questions to call the office.   thanks

## 2018-12-25 MED FILL — traMADol HCL 50 MG TABS: 50 | 10 days supply | Qty: 30 | Fill #1

## 2018-12-31 ENCOUNTER — Other Ambulatory Visit: Payer: Self-pay | Admitting: Family Medicine

## 2018-12-31 DIAGNOSIS — G4700 Insomnia, unspecified: Secondary | ICD-10-CM

## 2018-12-31 MED FILL — ZOLPIDEM TARTRATE 10 MG TAB: 10 | 30 days supply | Qty: 30 | Fill #0 | Status: TO

## 2019-01-04 ENCOUNTER — Ambulatory Visit: Payer: Self-pay | Admitting: Family Medicine

## 2019-01-08 ENCOUNTER — Encounter: Payer: Self-pay | Admitting: Family Medicine

## 2019-01-08 ENCOUNTER — Other Ambulatory Visit (HOSPITAL_COMMUNITY)
Admission: RE | Admit: 2019-01-08 | Discharge: 2019-01-08 | Disposition: A | Discharge status: Home / Self Care | Payer: Self-pay | Source: Ambulatory Visit | Attending: Family Medicine | Admitting: Family Medicine

## 2019-01-08 ENCOUNTER — Other Ambulatory Visit: Payer: Self-pay

## 2019-01-08 ENCOUNTER — Ambulatory Visit (INDEPENDENT_AMBULATORY_CARE_PROVIDER_SITE_OTHER): Payer: Self-pay | Admitting: Family Medicine

## 2019-01-08 VITALS — BP 150/74 | HR 111 | Temp 99.0°F

## 2019-01-08 DIAGNOSIS — N898 Other specified noninflammatory disorders of vagina: Secondary | ICD-10-CM | POA: Insufficient documentation

## 2019-01-08 DIAGNOSIS — N3000 Acute cystitis without hematuria: Secondary | ICD-10-CM | POA: Insufficient documentation

## 2019-01-08 LAB — POCT URINALYSIS DIP (MANUAL ENTRY)
Bilirubin, UA: NEGATIVE
Glucose, UA: 100 mg/dL — AB
Nitrite, UA: POSITIVE — AB
Protein Ur, POC: NEGATIVE mg/dL
RBC UA: NEGATIVE
Spec Grav, UA: 1.02 (ref 1.010–1.025)
Urobilinogen, UA: 0.2 E.U./dL
pH, UA: 6.5 (ref 5.0–8.0)

## 2019-01-08 LAB — POCT WET PREP (WET MOUNT)
CLUE CELLS WET PREP WHIFF POC: NEGATIVE
Trichomonas Wet Prep HPF POC: ABSENT

## 2019-01-08 MED ORDER — CEPHALEXIN 250 MG PO CAPS: 250.0000 mg | ORAL_CAPSULE | Freq: Four times a day (QID) | ORAL | 0 refills | Status: AC

## 2019-01-08 MED ORDER — CEPHALEXIN 250 MG PO CAPS: 250.0000 mg | ORAL_CAPSULE | Freq: Four times a day (QID) | ORAL | 0 refills | Status: DC

## 2019-01-08 MED FILL — CEPHALEXIN 250 MG CAPSULE: 250 | 5 days supply | Qty: 20 | Fill #0

## 2019-01-08 NOTE — Assessment & Plan Note (Signed)
Complains of dysuria intitially that changed to increased frequency. Wet prep negative. Urinalysis positive for nitrates and LE.  - keflex 250mg  four times daily for 5 days.  - f/u urine culture - has appt w/ PCP on march 6th.

## 2019-01-08 NOTE — Patient Instructions (Signed)
Your urinalysis shows that you have a UTI.  I have written a prescription for an antibiotic for you.  Take it four times a day for 5 days.  If your symptoms do not resolve after you have completed this medication, then call the clinic to see if we need to try another antibiotic.    Frederic Jericho, MD

## 2019-01-08 NOTE — Telephone Encounter (Signed)
error 

## 2019-01-08 NOTE — Progress Notes (Signed)
   [name] Phone: [phone]   cc: dysuria  Subjective:  Dysuria - burning when she urinates started on Friday.  She did not have discharge. She Started taking AZO Saturday morning and took it 3 times a day and Stopped Sunday.  Her urine became orange-reddish during this time.  Her symptoms resolved by Sunday but Monday she developed a 'pressure' feeling in her vagina as well as increased frequency.  No pain.  No discharge.  No vaginal odor. Recently changed soap from irish spring to irish spring aloe, and then back to regular within the past two months. detergent the same.  She went from cotton to silk underwear about a year ago.  Has not had sex recently. No other changes in her life. She has not had fevers but does have hot flashes. She gets a suprapubic abdominal pain once a month that lasts for about an hour for one day. No symptoms of incontinence.  No increased thirst. Blood sugars have been normal.  Highest reading has been 217.    ROS: See HPI for pertinent positives and negatives  Past Medical History  Family history reviewed for today's visit. No changes.  Social history- patient is a non smoker  Objective: BP (!) 150/74   Pulse (!) 111   Temp 99 F (37.2 C) (Oral)   SpO2 97%  Gen: NAD, alert and oriented, cooperative with exam CV: normal rate, regular rhythm. No murmurs, no rubs.  Resp: LCTAB, no wheezes, crackles. normal work of breathing GI: nontender to palpation, BS present, no guarding or organomegaly GU: no visible lesions on labia, vaginal wall, or cervix. No vaginal discharge. No odor.  Psych: Appropriate behavior  Assessment/Plan: Acute cystitis Complains of dysuria intitially that changed to increased frequency. Wet prep negative. Urinalysis positive for nitrates and LE.  - keflex 250mg  four times daily for 5 days.  - f/u urine culture - has appt w/ PCP on march 6th.    Health Maintenance - patient agreed to cervical cancer  screening today - pap performed.  Call pt with results.    Frederic Jericho, MD PGY-1

## 2019-01-10 ENCOUNTER — Encounter: Payer: Self-pay | Admitting: Family Medicine

## 2019-01-10 ENCOUNTER — Telehealth: Payer: Self-pay | Admitting: *Deleted

## 2019-01-10 DIAGNOSIS — N952 Postmenopausal atrophic vaginitis: Secondary | ICD-10-CM | POA: Insufficient documentation

## 2019-01-10 LAB — CYTOLOGY - PAP: HPV: NOT DETECTED

## 2019-01-10 NOTE — Telephone Encounter (Signed)
Patient calls to let MD know that the abx are not helping.  She is still having the same symptoms as she did at her visit. Ezinne Yogi, Maryjo Rochester, CMA

## 2019-01-10 NOTE — Telephone Encounter (Signed)
Called patient to discuss her symptoms and the results of her recent pap smear, which showed atrophic vaginitis.  Told patient that atrophic vaginitis can also cause these symptoms and that if she is still having symptoms after completing full course of ABX she can make another appointment to address the atrophic vaginitis.

## 2019-01-14 NOTE — Telephone Encounter (Signed)
Called pt and advised he to try Replens OTC moisturizer until she sees Dr. Parke Simmers on the 6th.

## 2019-01-14 NOTE — Telephone Encounter (Signed)
Patient left message she has appt on Friday 3/6 to discuss atrophic vaginitis but is very uncomfortable. Is there a cream or remedy she can use until appt?  Call back is [phone]  Ples Specter, RN [facility] RN)

## 2019-01-17 ENCOUNTER — Ambulatory Visit: Payer: Self-pay

## 2019-01-18 ENCOUNTER — Encounter: Payer: Self-pay | Admitting: Family Medicine

## 2019-01-18 ENCOUNTER — Other Ambulatory Visit: Payer: Self-pay

## 2019-01-18 ENCOUNTER — Ambulatory Visit (INDEPENDENT_AMBULATORY_CARE_PROVIDER_SITE_OTHER): Payer: Self-pay | Admitting: Family Medicine

## 2019-01-18 VITALS — BP 142/80 | HR 88 | Temp 98.7°F | Ht 64.0 in | Wt 180.8 lb

## 2019-01-18 DIAGNOSIS — E119 Type 2 diabetes mellitus without complications: Secondary | ICD-10-CM

## 2019-01-18 DIAGNOSIS — Z794 Long term (current) use of insulin: Secondary | ICD-10-CM

## 2019-01-18 DIAGNOSIS — N952 Postmenopausal atrophic vaginitis: Secondary | ICD-10-CM

## 2019-01-18 LAB — POCT GLYCOSYLATED HEMOGLOBIN (HGB A1C): HBA1C, POC (CONTROLLED DIABETIC RANGE): 7.7 % — AB (ref 0.0–7.0)

## 2019-01-18 MED ORDER — ESTROGENS, CONJUGATED 0.625 MG/GM VA CREA: 1.0000 | TOPICAL_CREAM | Freq: Every day | VAGINAL | 12 refills | Status: DC

## 2019-01-21 ENCOUNTER — Telehealth: Payer: Self-pay

## 2019-01-21 NOTE — Telephone Encounter (Signed)
Pt called nurse line stating she was given premarin cream recently, and is experiening "pain in the bottom of my stomach." I asked patient to be more specific with her pain description, however she kept stating, "you know, pain in the bottom." Pt wants to now if this is a side effect and if she should stop taking the medication. Please advise.

## 2019-01-22 NOTE — Telephone Encounter (Signed)
Contacted pt and she stated that she already quit using the cream and still having pain, appointment scheduled for pt with PCP on 01/23/2019 for this.Lamonte Sakai, April D, [name]

## 2019-01-22 NOTE — Assessment & Plan Note (Addendum)
A1c of 7.7.  Has had a few lower hypoglycemic events below 70 this month which she says she felt coming and was able to respond to appropriately.  We discussed that 7.7 is not optimal control but that given her risk of hypoglycemic events and how dangerous this can be, we will need to settle for the current regimen to avoid causing more harm.  She agrees with this plan  She was reminded that she needs to go get an ophthalmology exam, she has no foot concerns at this time.

## 2019-01-22 NOTE — Progress Notes (Signed)
    Subjective:  [name] is a 63 y.o. female who presents to the [facility] today with a chief complaint of vaginal discomfort.   HPI: Atrophic vaginitis Patient declined vaginal exam but said that she is having discomfort when wiping her vaginal area, was found to have atrophic vaginitis on prior exam with Dr. Constance Goltz.  Denies bleeding or discharge or urinary symptoms  Diabetes mellitus (HCC) A1c of 7.7.  Has had a few lower hypoglycemic events below 70 this month which she says she felt coming and was able to respond to appropriately.  We discussed that 7.7 is not optimal control but that given her risk of hypoglycemic events it can be dangerous to pursue tighter control  Objective:  Physical Exam: BP (!) 142/80   Pulse 88   Temp 98.7 F (37.1 C) (Oral)   Ht 5\' 4"  (1.626 m)   Wt 180 lb 12.8 oz (82 kg)   SpO2 99%   BMI 31.03 kg/m   Gen: NAD, resting comfortably CV: RRR with no murmurs appreciated Pulm: NWOB, CTAB with no crackles, wheezes, or rhonchi GI: Normal bowel sounds present. Soft, Nontender, Nondistended. MSK: no edema, cyanosis, or clubbing noted Skin: warm, dry Declines GU exam Neuro: grossly normal, moves all extremities Psych: Normal affect and thought content  No results found for this or any previous visit (from the past 72 hour(s)).   Assessment/Plan:  Atrophic vaginitis Patient declined vaginal exam but said that she is having discomfort when wiping her vaginal area, was found to have atrophic vaginitis on prior exam with Dr. Constance Goltz.  Denies bleeding or discharge or urinary symptoms  Premarin cream prescribed and return precautions given  Diabetes mellitus (HCC) A1c of 7.7.  Has had a few lower hypoglycemic events below 70 this month which she says she felt coming and was able to respond to appropriately.  We discussed that 7.7 is not optimal control but that given her risk of hypoglycemic events and how dangerous this can be, we will need to settle for the  current regimen to avoid causing more harm.  She agrees with this plan  She was reminded that she needs to go get an ophthalmology exam, she has no foot concerns at this time.   Marthenia Rolling, DO FAMILY MEDICINE RESIDENT - PGY2 01/22/2019 2:50 PM

## 2019-01-22 NOTE — Assessment & Plan Note (Addendum)
Patient declined vaginal exam but said that she is having discomfort when wiping her vaginal area, was found to have atrophic vaginitis on prior exam with Dr. Constance Goltz.  Denies bleeding or discharge or urinary symptoms  Premarin cream prescribed and return precautions given

## 2019-01-23 ENCOUNTER — Encounter: Payer: Self-pay | Admitting: Family Medicine

## 2019-01-23 ENCOUNTER — Ambulatory Visit (INDEPENDENT_AMBULATORY_CARE_PROVIDER_SITE_OTHER): Payer: Self-pay | Admitting: Family Medicine

## 2019-01-23 ENCOUNTER — Other Ambulatory Visit: Payer: Self-pay

## 2019-01-23 VITALS — BP 148/80 | HR 100 | Temp 98.3°F | Wt 183.0 lb

## 2019-01-23 DIAGNOSIS — N952 Postmenopausal atrophic vaginitis: Secondary | ICD-10-CM

## 2019-01-23 DIAGNOSIS — E119 Type 2 diabetes mellitus without complications: Secondary | ICD-10-CM

## 2019-01-23 DIAGNOSIS — Z794 Long term (current) use of insulin: Secondary | ICD-10-CM

## 2019-01-23 MED ORDER — METFORMIN HCL 1000 MG PO TABS: 1000.0000 mg | ORAL_TABLET | Freq: Two times a day (BID) | ORAL | 5 refills | Status: DC

## 2019-01-23 NOTE — Assessment & Plan Note (Addendum)
Pt with history of atrophic vaginitis started on Premarin 2G QD presentingwith new complaint of lower abdominal/suprabuic pain and cramping. Patient advised to lower Premarin dose to 1G QD and discontinue if cramping symptoms reappear and follow up.

## 2019-01-23 NOTE — Progress Notes (Signed)
    Subjective:  [name] is a 63 y.o. female who presents to the [facility] today with a chief complaint of suprapubic pain and abdominal cramping. [name] reports that she started taking 2G QD Premarin on Saturday as prescribed in her last visit for atrophic vaginitis. She reports that she began experiencing new abdominal cramping and bloating on Sunday morning which worsens when sitting down and standing up. She describes the pain as a constant pressure. She discontinued the Premarin on Monday and experienced a decrease in symptoms. She denies any nausea, vomiting, diarrhea, or constipation; and bowel movements are occurring as normal.     Objective:  Physical Exam: BP (!) 148/80   Pulse 100   Temp 98.3 F (36.8 C) (Oral)   Wt 183 lb (83 kg)   SpO2 98%   BMI 31.41 kg/m   Gen: NAD, resting comfortably CV: RRR with no murmurs appreciated Pulm: NWOB, CTAB with no crackles, wheezes, or rhonchi GI: Normal bowel sounds present. Soft, Nontender, Nondistended. MSK: no edema, cyanosis, or clubbing noted Skin: warm, dry Neuro: grossly normal, moves all extremities Psych: Normal affect and thought content    Assessment/Plan:  Atrophic vaginitis Pt with history of atrophic vaginitis started on Premarin 2G QD presentingwith new complaint of lower abdominal/suprabuic pain and cramping. Patient advised to lower Premarin dose to 1G QD and discontinue if cramping symptoms reappear and follow up.    [place] Faster [facility], MS3 01/23/2019 11:00 AM

## 2019-01-23 NOTE — Patient Instructions (Signed)
It was a pleasure to see you today! Thank you for choosing [facility] for your primary care. [name] was seen for cramping with use of Premarin.   It seems that the dose or using of 2 g/day for the Premarin was causing cramping so would like to go ahead and try a lower dose of 1 g/day to see if that changes see her reaction and still offer some improvement to her vaginal atrophy.  Please come back and see [place] in a few weeks if this does not help you and we can try other options.     Please bring all your medications to every doctors visit   Sign up for My Chart to have easy access to your labs results, and communication with your Primary care physician.     Please check-out at the front desk before leaving the clinic.     Best,  Dr. Marthenia Rolling FAMILY MEDICINE RESIDENT - PGY2 01/23/2019 9:36 AM

## 2019-01-24 ENCOUNTER — Other Ambulatory Visit: Payer: Self-pay | Admitting: Family Medicine

## 2019-01-24 MED FILL — traMADol HCL 50 MG TABS: 50 | 10 days supply | Qty: 30 | Fill #0 | Status: TO

## 2019-01-25 NOTE — Progress Notes (Signed)
    Subjective:  [name] is a 62 y.o. female who presents to the [facility] today with a chief complaint of suprapubic pain and abdominal cramping. [name] reports that she started taking 2G QD Premarin on Saturday as prescribed in her last visit for atrophic vaginitis. She reports that she began experiencing new abdominal cramping and bloating on Sunday morning which worsens when sitting down and standing up. She describes the pain as a constant pressure. She discontinued the Premarin on Monday and experienced a decrease in symptoms.  She denies any nausea, vomiting, diarrhea, or constipation; and bowel movements are occurring as normal.     Objective:  Physical Exam: BP (!) 148/80   Pulse 100   Temp 98.3 F (36.8 C) (Oral)   Wt 183 lb (83 kg)   SpO2 98%   BMI 31.41 kg/m   Gen: NAD, resting comfortably CV: RRR with no murmurs appreciated Pulm: NWOB, CTAB with no crackles, wheezes, or rhonchi GI: Normal bowel sounds present. Soft, Nontender, Nondistended. MSK: no edema, cyanosis, or clubbing noted Skin: warm, dry Neuro: grossly normal, moves all extremities Psych: Normal affect and thought content  Lab Results Past 72 Hours  No results found for this or any previous visit (from the past 72 hour(s)).     Assessment/Plan:  Atrophic vaginitis Pt with history of atrophic vaginitis started on Premarin 2G QD presentingwith new complaint of lower abdominal/suprabuic pain and cramping. Patient advised to lower Premarin dose to 1G QD and discontinue if cramping symptoms reappear and follow up.    *The above was copied from the medical students documentation as she saw this patient with me*   I personally saw and evaluated the patient, performing the key elements of the service. I developed and verified the management plan that is described in the medical student's note, and I agree with the content with my edits above.   General: well nourished, well developed, in no  acute distress with non-toxic appearance HEENT: normocephalic, atraumatic, moist mucous membranes Neck: supple, non-tender without lymphadenopathy CV: regular rate and rhythm without murmurs, rubs, or gallops, no lower extremity edema Lungs: CTA bilaterally with normal work of breathing, no wheezes/rales/rhonchi Abdomen: soft, non-tender, non-distended, no masses or organomegaly palpable, normoactive bowel sounds, no masses, no acute belly, no significant right lower quadrant belly pain. Skin: warm, dry, no rashes or lesions GU :deferred Extremities: warm and well perfused, normal tone MSK: Full ROM, strength intact Neuro: Alert and oriented, speech normal  Assessment: Potential uterine cramping in this postmenopausal female after starting Premarin cream, she was using 2 g/day.  Plan: Discussed pros and cons of either switching to a nonhormonal method for treating her atrophic vaginitis symptoms versus trying a lower dose of Premarin to see if it would reduce her cramping response and still treat her vaginitis.  Patient elects to try 1 g versus the prior 2 g of Premarin to see if that improves her side effects.  Dr. Marthenia Rolling, PGY2 [facility] 01/25/2019 9:39 AM

## 2019-01-28 ENCOUNTER — Other Ambulatory Visit: Payer: Self-pay | Admitting: Family Medicine

## 2019-02-05 MED FILL — ZOLPIDEM TARTRATE 10 MG TAB: 10 | 30 days supply | Qty: 30 | Fill #0

## 2019-02-11 ENCOUNTER — Other Ambulatory Visit: Payer: Self-pay | Admitting: Family Medicine

## 2019-02-18 ENCOUNTER — Telehealth: Payer: Self-pay | Admitting: *Deleted

## 2019-02-18 MED ORDER — CEPHALEXIN 500 MG PO CAPS: 500.0000 mg | ORAL_CAPSULE | Freq: Four times a day (QID) | ORAL | 0 refills | Status: AC

## 2019-02-18 MED FILL — traMADol HCL 50 MG TABS: 50 | 10 days supply | Qty: 30 | Fill #0

## 2019-02-18 NOTE — Telephone Encounter (Signed)
Patient had been describing "burning" sensation to staff when calling in.  Initially this was being treated as vaginal atrophy off exam and patient was saying she did not have urinary symptoms.  With new complaint of burning sensation we will treat as UTI off of her description.  Kefles ordered,  -Dr. Parke Simmers

## 2019-02-18 NOTE — Telephone Encounter (Signed)
Pt states that the premarin is not working. She is "still having burning".  Will forward to MD. Jone Baseman, CMA

## 2019-02-19 NOTE — Telephone Encounter (Signed)
Patient contacted and informed of antibiotic to her pharmacy for UTI. Pt informed if she doesn't seem to have relief of symptoms, she will need to be seen. Pt verbalized understanding.

## 2019-03-04 ENCOUNTER — Ambulatory Visit: Payer: Self-pay

## 2019-03-04 NOTE — Telephone Encounter (Signed)
Pt called nurse line stating the cream that was given to her is not helping. Pt stated she is still experiencing a lot of burning. At this point  do you want her to come in to be seen? Or schedule her a telemedicine visit. Please advise.

## 2019-03-04 NOTE — Telephone Encounter (Signed)
Scheduled her for ATC this afternoon. Pt wanted soonest available and she is not having any res sxs and informed to come alone.

## 2019-03-06 ENCOUNTER — Ambulatory Visit (INDEPENDENT_AMBULATORY_CARE_PROVIDER_SITE_OTHER): Payer: Self-pay | Admitting: Family Medicine

## 2019-03-06 ENCOUNTER — Other Ambulatory Visit: Payer: Self-pay | Admitting: Family Medicine

## 2019-03-06 ENCOUNTER — Other Ambulatory Visit: Payer: Self-pay

## 2019-03-06 VITALS — BP 152/80 | HR 95 | Temp 98.7°F | Ht 64.0 in | Wt 185.2 lb

## 2019-03-06 DIAGNOSIS — G4700 Insomnia, unspecified: Secondary | ICD-10-CM

## 2019-03-06 DIAGNOSIS — N952 Postmenopausal atrophic vaginitis: Secondary | ICD-10-CM

## 2019-03-06 MED ORDER — ESTROGENS CONJUGATED 0.3 MG PO TABS: 0.3000 mg | ORAL_TABLET | Freq: Every day | ORAL | 0 refills | Status: DC

## 2019-03-06 MED FILL — ZOLPIDEM TARTRATE 10 MG TAB: 10 | 30 days supply | Qty: 30 | Fill #0

## 2019-03-06 NOTE — Patient Instructions (Signed)
It was great to meet you today! Thank you for letting me participate in your care!  Today, we discussed your vaginal dryness and that the cream was causing burning and itching with continued dryness. I am prescribing you a pill to take instead of the cream. Please take it once per day for 21 days, then do not take it for 7 days. If it works we will refill your prescription.  Be well, Jules Schick, DO PGY-2, [facility]

## 2019-03-06 NOTE — Progress Notes (Signed)
     Subjective: Chief Complaint  Patient presents with  . vaginal dryness     HPI: [name] is a 63 y.o. presenting to clinic today to discuss the following:  Follow Up for Vaginal Dryness and itching Patient returns to clinic due to concerns for vaginal dryness and itching related to menopause. She has no discharge. She was recently started on an estrogen cream last time she was here and states the cream worked "for a little bit" but no is causing burning and cramping. She would like to go back on a pill.     ROS noted in HPI.   Past Medical, Surgical, Social, and Family History Reviewed & Updated per EMR.   Pertinent Historical Findings include:   Social History   Tobacco Use  Smoking Status Never Smoker  Smokeless Tobacco Former Neurosurgeon  . Types: Snuff  Tobacco Comment   Quit for 1.5 months in past. Current quit date 01/28/2013    Objective: BP (!) 152/80   Pulse 95   Temp 98.7 F (37.1 C) (Oral)   Ht 5\' 4"  (1.626 m)   Wt 185 lb 3.2 oz (84 kg)   SpO2 96%   BMI 31.79 kg/m  Vitals and nursing notes reviewed  Physical Exam Gen: Alert and Oriented x 3, NAD HEENT: Normocephalic, atraumatic CV: RRR, no murmurs, normal S1, S2 split Resp: CTAB, no wheezing, rales, or rhonchi, comfortable work of breathing Ext: no clubbing, cyanosis, or edema Skin: warm, dry, intact, no rashes  Assessment/Plan:  Atrophic vaginitis Patient was not tolerating the estorgen cream. - Will restart premarin 0.3mg  for 21 days.  - F/u in one month   PATIENT EDUCATION PROVIDED: See AVS    Diagnosis and plan along with any newly prescribed medication(s) were discussed in detail with this patient today. The patient verbalized understanding and agreed with the plan. Patient advised if symptoms worsen return to clinic or ER.    No orders of the defined types were placed in this encounter.   Meds ordered this encounter  Medications  . estrogens, conjugated, (PREMARIN) 0.3 MG  tablet    Sig: Take 1 tablet (0.3 mg total) by mouth daily for 21 days. Take daily for 21 days then do not take for 7 days.    Dispense:  21 tablet    Refill:  0     Jules Schick, DO 03/06/2019, 3:39 PM PGY-2 [facility]

## 2019-03-07 ENCOUNTER — Other Ambulatory Visit: Payer: Self-pay | Admitting: Family Medicine

## 2019-03-07 DIAGNOSIS — Z1231 Encounter for screening mammogram for malignant neoplasm of breast: Secondary | ICD-10-CM

## 2019-03-12 NOTE — Assessment & Plan Note (Signed)
Patient was not tolerating the estorgen cream. - Will restart premarin 0.3mg  for 21 days.  - F/u in one month

## 2019-03-15 ENCOUNTER — Other Ambulatory Visit: Payer: Self-pay | Admitting: Family Medicine

## 2019-03-15 ENCOUNTER — Telehealth: Payer: Self-pay

## 2019-03-15 DIAGNOSIS — I10 Essential (primary) hypertension: Secondary | ICD-10-CM

## 2019-03-15 MED ORDER — ESTRADIOL 10 MCG VA TABS: 10.0000 ug | ORAL_TABLET | Freq: Every day | VAGINAL | 0 refills | Status: DC

## 2019-03-15 MED ORDER — ESTROGENS CONJUGATED 0.3 MG PO TABS: 0.3000 mg | ORAL_TABLET | Freq: Every day | ORAL | 0 refills | Status: DC

## 2019-03-15 MED ORDER — ESTRADIOL 10 MCG VA TABS: 10.0000 ug | ORAL_TABLET | Freq: Every day | VAGINAL | 0 refills | Status: AC

## 2019-03-15 NOTE — Progress Notes (Signed)
Sent estradiol  Vaginal tablet to requested pharmacy

## 2019-03-15 NOTE — Progress Notes (Signed)
States Premarin is too expensive and want alternative. Sending in vaginal insert which is cheaper.

## 2019-03-15 NOTE — Addendum Note (Signed)
Addended by: Georges Lynch T on: 03/15/2019 04:25 PM   Modules accepted: Orders

## 2019-03-15 NOTE — Telephone Encounter (Signed)
Spoke to pt. She would like the insert sent to [facility] on [address]. Pt also asked if her lisinopril can be refilled also. Please send to [facility]. Sunday Spillers, CMA

## 2019-03-15 NOTE — Telephone Encounter (Signed)
Pt calling because the premarin that was called in but is too expensive. Pt would like something cheaper called in. Please call pt and let her know what is being sent to pharmacy. [phone]. Sunday Spillers, CMA

## 2019-03-15 NOTE — Telephone Encounter (Signed)
Pt called nurse line stating Premarin is not in stock at the health department and it will take "weeks." Pt wants this medication sent to [facility] outpatient. Will send this for patient and cancel at [facility].

## 2019-03-18 ENCOUNTER — Other Ambulatory Visit: Payer: Self-pay | Admitting: Family Medicine

## 2019-03-18 ENCOUNTER — Other Ambulatory Visit: Payer: Self-pay

## 2019-03-18 DIAGNOSIS — I10 Essential (primary) hypertension: Secondary | ICD-10-CM

## 2019-03-18 MED ORDER — LISINOPRIL 20 MG PO TABS: 40.0000 mg | ORAL_TABLET | Freq: Every day | ORAL | 1 refills | Status: DC

## 2019-03-18 NOTE — Telephone Encounter (Signed)
Done

## 2019-03-19 ENCOUNTER — Telehealth: Payer: Self-pay

## 2019-03-19 NOTE — Telephone Encounter (Signed)
[facility] [phone]) sent request to verify directions on Estradiol. Rx has Place 1 tablet ( total) vaginally daily for 30 days. Take 1 tablet daily for the first two weeks, then take 1 tablet twice per week. Please verify if it is one time daily for 30 days or first two weeks. Sunday Spillers, CMA

## 2019-03-20 ENCOUNTER — Other Ambulatory Visit: Payer: Self-pay | Admitting: Family Medicine

## 2019-03-20 MED FILL — traMADol HCL 50 MG TABS: 50 | 10 days supply | Qty: 30 | Fill #0

## 2019-03-22 NOTE — Telephone Encounter (Signed)
Looks like order was placed on 5/1 with receipt confirmed by pharmacy. Patient should call pharmacy [facility]) to check on status of filling.

## 2019-03-22 NOTE — Telephone Encounter (Signed)
Pt calling to check status.  Sent to MD who saw patient as well as MD covering PCP's box. Jone Baseman, CMA

## 2019-03-27 ENCOUNTER — Other Ambulatory Visit: Payer: Self-pay | Admitting: Family Medicine

## 2019-04-09 ENCOUNTER — Other Ambulatory Visit: Payer: Self-pay | Admitting: Family Medicine

## 2019-04-09 DIAGNOSIS — G4700 Insomnia, unspecified: Secondary | ICD-10-CM

## 2019-04-09 MED FILL — ZOLPIDEM TARTRATE 10 MG TAB: 10 | 30 days supply | Qty: 30 | Fill #0

## 2019-04-11 ENCOUNTER — Other Ambulatory Visit: Payer: Self-pay | Admitting: *Deleted

## 2019-04-12 MED ORDER — ESTROGENS CONJUGATED 0.3 MG PO TABS: 0.3000 mg | ORAL_TABLET | Freq: Every day | ORAL | 0 refills | Status: DC

## 2019-04-19 ENCOUNTER — Other Ambulatory Visit: Payer: Self-pay | Admitting: Family Medicine

## 2019-04-19 MED FILL — traMADol HCL 50 MG TABS: 50 | 10 days supply | Qty: 30 | Fill #0

## 2019-04-25 ENCOUNTER — Other Ambulatory Visit: Payer: Self-pay | Admitting: Family Medicine

## 2019-05-03 ENCOUNTER — Ambulatory Visit
Admission: RE | Admit: 2019-05-03 | Discharge: 2019-05-03 | Disposition: A | Discharge status: Home / Self Care | Payer: Self-pay | Source: Ambulatory Visit | Attending: *Deleted | Admitting: *Deleted

## 2019-05-03 ENCOUNTER — Other Ambulatory Visit: Payer: Self-pay

## 2019-05-03 DIAGNOSIS — Z1231 Encounter for screening mammogram for malignant neoplasm of breast: Secondary | ICD-10-CM

## 2019-05-13 ENCOUNTER — Other Ambulatory Visit: Payer: Self-pay | Admitting: *Deleted

## 2019-05-13 ENCOUNTER — Other Ambulatory Visit: Payer: Self-pay | Admitting: Family Medicine

## 2019-05-13 DIAGNOSIS — G4700 Insomnia, unspecified: Secondary | ICD-10-CM

## 2019-05-13 DIAGNOSIS — I10 Essential (primary) hypertension: Secondary | ICD-10-CM

## 2019-05-13 MED FILL — ZOLPIDEM TARTRATE 10 MG TAB: 10 | 30 days supply | Qty: 30 | Fill #0

## 2019-05-14 ENCOUNTER — Other Ambulatory Visit: Payer: Self-pay | Admitting: Family Medicine

## 2019-05-14 MED ORDER — LISINOPRIL 20 MG PO TABS: 40.0000 mg | ORAL_TABLET | Freq: Every day | ORAL | 3 refills | Status: DC

## 2019-05-16 ENCOUNTER — Other Ambulatory Visit: Payer: Self-pay | Admitting: Family Medicine

## 2019-05-16 DIAGNOSIS — I10 Essential (primary) hypertension: Secondary | ICD-10-CM

## 2019-05-17 MED ORDER — ATORVASTATIN CALCIUM 40 MG PO TABS: 40.0000 mg | ORAL_TABLET | Freq: Every day | ORAL | 3 refills | Status: DC

## 2019-05-20 ENCOUNTER — Other Ambulatory Visit: Payer: Self-pay | Admitting: Family Medicine

## 2019-05-21 MED FILL — traMADol HCL 50 MG TABS: 50 | 30 days supply | Qty: 30 | Fill #0

## 2019-05-27 ENCOUNTER — Ambulatory Visit (INDEPENDENT_AMBULATORY_CARE_PROVIDER_SITE_OTHER): Payer: Self-pay | Admitting: Family Medicine

## 2019-05-27 ENCOUNTER — Encounter: Payer: Self-pay | Admitting: Family Medicine

## 2019-05-27 ENCOUNTER — Other Ambulatory Visit: Payer: Self-pay

## 2019-05-27 VITALS — BP 140/82 | HR 90

## 2019-05-27 DIAGNOSIS — N952 Postmenopausal atrophic vaginitis: Secondary | ICD-10-CM

## 2019-05-27 DIAGNOSIS — Z794 Long term (current) use of insulin: Secondary | ICD-10-CM

## 2019-05-27 DIAGNOSIS — E119 Type 2 diabetes mellitus without complications: Secondary | ICD-10-CM

## 2019-05-27 LAB — POCT GLYCOSYLATED HEMOGLOBIN (HGB A1C): HbA1c, POC (controlled diabetic range): 7.7 % — AB (ref 0.0–7.0)

## 2019-05-27 NOTE — Assessment & Plan Note (Signed)
a1c 7.7, 1 noted hypoglycemic event to 65, did not feel poorly at the time, no adverse event  Passed foot exam today, given list of eye doctors and asked to get eye exam results to [place]  No medication changes at this time

## 2019-05-27 NOTE — Assessment & Plan Note (Signed)
Doing much better on premarin, very happy with resolution of symptoms

## 2019-06-07 ENCOUNTER — Other Ambulatory Visit: Payer: Self-pay | Admitting: Family Medicine

## 2019-06-09 NOTE — Progress Notes (Signed)
    Subjective:  [name] is a 63 y.o. female who presents to the [facility] today with a chief complaint of diabetes.   HPI: Patient comes in today for regular scheduled visit to check on her diabetes status.  She thinks that she has been doing pretty well and expects no major surprises.  She was happy to find out that her A1c was stable.  She has been having no visual changes and no problems with her feet lately.  She has no physical complaints  We also discussed her prior problem with atrophic vaginitis, she says she is been having no problems with this lately and that the medicine has been working as well.  Objective:  Physical Exam: BP 140/82   Pulse 90   SpO2 97%   Gen: NAD, comfortable CV: RRR with no murmurs appreciated Pulm: NWOB, CTAB with no crackles, wheezes, or rhonchi GI: Normal bowel sounds present. Soft, Nontender, Nondistended. MSK: no edema, cyanosis, or clubbing noted Skin: warm, dry, no wounding or lack of sensation on feet. Neuro: grossly normal, moves all extremities Psych: Normal affect and thought content  No results found for this or any previous visit (from the past 72 hour(s)).   Assessment/Plan:  Diabetes mellitus ([place]) a1c 7.7, 1 noted hypoglycemic event to 65, did not feel poorly at the time, no adverse event  Passed foot exam today, given list of eye doctors and asked to get eye exam results to [place]  No medication changes at this time  Atrophic vaginitis Doing much better on premarin, very happy with resolution of symptoms   Sherene Sires, [place] - PGY2 06/09/2019 7:51 PM

## 2019-06-14 ENCOUNTER — Other Ambulatory Visit: Payer: Self-pay | Admitting: Family Medicine

## 2019-06-14 DIAGNOSIS — G4700 Insomnia, unspecified: Secondary | ICD-10-CM

## 2019-06-17 ENCOUNTER — Other Ambulatory Visit: Payer: Self-pay | Admitting: Family Medicine

## 2019-06-17 MED FILL — ZOLPIDEM TARTRATE 10 MG TAB: 10 | 30 days supply | Qty: 30 | Fill #0

## 2019-06-18 MED FILL — traMADol HCL 50 MG TABS: 50 | 30 days supply | Qty: 30 | Fill #0

## 2019-06-25 ENCOUNTER — Other Ambulatory Visit: Payer: Self-pay | Admitting: Family Medicine

## 2019-07-09 ENCOUNTER — Other Ambulatory Visit: Payer: Self-pay | Admitting: Family Medicine

## 2019-07-10 ENCOUNTER — Telehealth: Payer: Self-pay

## 2019-07-10 DIAGNOSIS — E119 Type 2 diabetes mellitus without complications: Secondary | ICD-10-CM

## 2019-07-10 DIAGNOSIS — Z794 Long term (current) use of insulin: Secondary | ICD-10-CM

## 2019-07-10 NOTE — Telephone Encounter (Signed)
Pt tried to schedule eye exam but needs a referral placed. Please call pt @ [phone] and let her know when this is done so she can get her eye exam scheduled. Ottis Stain, CMA

## 2019-07-14 ENCOUNTER — Other Ambulatory Visit: Payer: Self-pay | Admitting: Family Medicine

## 2019-07-14 DIAGNOSIS — I10 Essential (primary) hypertension: Secondary | ICD-10-CM

## 2019-07-16 ENCOUNTER — Other Ambulatory Visit: Payer: Self-pay | Admitting: Family Medicine

## 2019-07-16 DIAGNOSIS — I10 Essential (primary) hypertension: Secondary | ICD-10-CM

## 2019-07-16 DIAGNOSIS — G4700 Insomnia, unspecified: Secondary | ICD-10-CM

## 2019-07-17 ENCOUNTER — Other Ambulatory Visit: Payer: Self-pay | Admitting: Family Medicine

## 2019-07-17 MED FILL — traMADol HCL 50 MG TABS: 50 | 30 days supply | Qty: 30 | Fill #0

## 2019-07-17 MED FILL — ZOLPIDEM TARTRATE 10 MG TAB: 10 | 30 days supply | Qty: 30 | Fill #0

## 2019-07-29 ENCOUNTER — Other Ambulatory Visit: Payer: Self-pay | Admitting: Family Medicine

## 2019-07-29 DIAGNOSIS — Z794 Long term (current) use of insulin: Secondary | ICD-10-CM

## 2019-07-29 DIAGNOSIS — E119 Type 2 diabetes mellitus without complications: Secondary | ICD-10-CM

## 2019-08-12 ENCOUNTER — Other Ambulatory Visit: Payer: Self-pay | Admitting: Family Medicine

## 2019-08-13 ENCOUNTER — Other Ambulatory Visit: Payer: Self-pay | Admitting: Family Medicine

## 2019-08-13 DIAGNOSIS — G4700 Insomnia, unspecified: Secondary | ICD-10-CM

## 2019-08-14 MED FILL — ZOLPIDEM TARTRATE 10 MG TAB: 10 | 30 days supply | Qty: 30 | Fill #0

## 2019-08-14 MED FILL — traMADol HCL 50 MG TABS: 50 | 30 days supply | Qty: 30 | Fill #0

## 2019-08-26 ENCOUNTER — Other Ambulatory Visit: Payer: Self-pay | Admitting: Family Medicine

## 2019-09-03 ENCOUNTER — Other Ambulatory Visit: Payer: Self-pay | Admitting: Family Medicine

## 2019-09-10 ENCOUNTER — Other Ambulatory Visit: Payer: Self-pay | Admitting: Family Medicine

## 2019-09-11 ENCOUNTER — Other Ambulatory Visit: Payer: Self-pay | Admitting: Family Medicine

## 2019-09-11 DIAGNOSIS — G4700 Insomnia, unspecified: Secondary | ICD-10-CM

## 2019-09-12 MED FILL — traMADol HCL 50 MG TABS: 50 | 30 days supply | Qty: 30 | Fill #0

## 2019-09-12 MED FILL — ZOLPIDEM TARTRATE 10 MG TAB: 10 | 30 days supply | Qty: 30 | Fill #0

## 2019-09-16 ENCOUNTER — Other Ambulatory Visit: Payer: Self-pay | Admitting: Family Medicine

## 2019-09-16 DIAGNOSIS — I10 Essential (primary) hypertension: Secondary | ICD-10-CM

## 2019-10-03 ENCOUNTER — Other Ambulatory Visit: Payer: Self-pay | Admitting: Family Medicine

## 2019-10-09 ENCOUNTER — Other Ambulatory Visit: Payer: Self-pay | Admitting: Family Medicine

## 2019-10-09 DIAGNOSIS — G4700 Insomnia, unspecified: Secondary | ICD-10-CM

## 2019-10-11 MED FILL — traMADol HCL 50 MG TABS: 50 | 30 days supply | Qty: 30 | Fill #0

## 2019-10-11 MED FILL — ZOLPIDEM TARTRATE 10 MG TAB: 10 | 30 days supply | Qty: 30 | Fill #0

## 2019-10-14 ENCOUNTER — Ambulatory Visit (INDEPENDENT_AMBULATORY_CARE_PROVIDER_SITE_OTHER): Payer: Self-pay | Admitting: Family Medicine

## 2019-10-14 ENCOUNTER — Other Ambulatory Visit: Payer: Self-pay

## 2019-10-14 ENCOUNTER — Encounter: Payer: Self-pay | Admitting: Family Medicine

## 2019-10-14 VITALS — BP 144/72 | HR 97 | Ht 64.0 in | Wt 184.0 lb

## 2019-10-14 DIAGNOSIS — Z794 Long term (current) use of insulin: Secondary | ICD-10-CM

## 2019-10-14 DIAGNOSIS — K5900 Constipation, unspecified: Secondary | ICD-10-CM

## 2019-10-14 DIAGNOSIS — I10 Essential (primary) hypertension: Secondary | ICD-10-CM

## 2019-10-14 DIAGNOSIS — E119 Type 2 diabetes mellitus without complications: Secondary | ICD-10-CM

## 2019-10-14 LAB — POCT GLYCOSYLATED HEMOGLOBIN (HGB A1C): Hemoglobin A1C: 7.6 % — AB (ref 4.0–5.6)

## 2019-10-14 MED ORDER — HYDROCHLOROTHIAZIDE 25 MG PO TABS: 25.0000 mg | ORAL_TABLET | Freq: Every day | ORAL | 3 refills | Status: DC

## 2019-10-14 MED ORDER — POLYETHYLENE GLYCOL 3350 17 GM/SCOOP PO POWD: 17.0000 g | Freq: Every day | ORAL | 0 refills | Status: DC

## 2019-10-14 NOTE — Progress Notes (Signed)
    Subjective:  [name] is a 63 y.o. female who presents to the [facility] today with a chief complaint of constipation.   HPI: HYPERTENSION, BENIGN SYSTEMIC Hypertension still uncontrolled on lisinopril 20 and HCTZ 12.5.  Patient is consistent with meds  Diabetes mellitus ([place]) A1c improving, no significant hypoglycemic events.  Constipation Patient says she only goes the bathroom approximately twice per week.  No blood, no straining but feels like she is "constantly blocked ".  Objective:  Physical Exam: BP (!) 144/72   Pulse 97   Ht 5\' 4"  (1.626 m)   Wt 184 lb (83.5 kg)   SpO2 96%   BMI 31.58 kg/m   Gen: NAD, pleasant, conversing comfortably CV: RRR with no murmurs appreciated Pulm: NWOB, CTAB with no crackles, wheezes, or rhonchi MSK: no edema, cyanosis, or clubbing noted Skin: warm, dry Neuro: grossly normal, moves all extremities Psych: Normal affect and thought content  Results for orders placed or performed in visit on 10/14/19 (from the past 72 hour(s))  HgB A1c     Status: Abnormal   Collection Time: 10/14/19  9:59 AM  Result Value Ref Range   Hemoglobin A1C 7.6 (A) 4.0 - 5.6 %   HbA1c POC (<> result, manual entry)     HbA1c, POC (prediabetic range)     HbA1c, POC (controlled diabetic range)       Assessment/Plan:  HYPERTENSION, BENIGN SYSTEMIC Hypertension still uncontrolled on lisinopril 20 and HCTZ 12.5.  Discussed this with patient and will increase HCTZ to 25 mg from 12.5.  We will get follow-up BMP in approximately week during lab visit.  Patient understands  [name] A1c improving, no significant hypoglycemic events.  No medication changes at this time  Constipation Patient says she only goes the bathroom approximately twice per week.  No blood, no straining but feels like she is "constantly blocked ".  We will start MiraLAX.  Although prescribed at up to twice daily, we did discuss starting an every other day and slowly  increasing doses to titrated effect of wanting 1 soft bowel movement either every day or every other day.  Patient understands   Sherene Sires, DO FAMILY MEDICINE RESIDENT - PGY3 10/15/2019 9:39 AM

## 2019-10-14 NOTE — Patient Instructions (Signed)
It was a pleasure to see you today! Thank you for choosing [facility] for your primary care. [name] was seen for regular follow-up. Come back to the clinic in about a month.  Congratulations on [name] of 7.6 you are doing good on your diabetes.   Today we made a few medication changes.  We added a daily MiraLAX which should help you be more regular in her bowel movements.  You can slowly increase her slowly decrease this amount to try and have a bowel movement either every day or every other day.  You should not be straining when you go.  We also added to your hydrochlorothiazide dose, you will now be taking 1 full tablet of 25 mg daily.  Come back for a lab visit in approximately 1 week so we can check your blood work and then you can come see me in another month or so to check in.   Please bring all your medications to every doctors visit   Sign up for My Chart to have easy access to your labs results, and communication with your Primary care physician.     Please check-out at the front desk before leaving the clinic.     Best,  Dr. Sherene Hanson FAMILY MEDICINE RESIDENT - PGY3 10/14/2019 10:05 AM

## 2019-10-15 DIAGNOSIS — K5900 Constipation, unspecified: Secondary | ICD-10-CM | POA: Insufficient documentation

## 2019-10-15 NOTE — Assessment & Plan Note (Signed)
A1c improving, no significant hypoglycemic events.  No medication changes at this time

## 2019-10-15 NOTE — Assessment & Plan Note (Signed)
Hypertension still uncontrolled on lisinopril 20 and HCTZ 12.5.  Discussed this with patient and will increase HCTZ to 25 mg from 12.5.  We will get follow-up BMP in approximately week during lab visit.  Patient understands

## 2019-10-15 NOTE — Assessment & Plan Note (Signed)
Patient says she only goes the bathroom approximately twice per week.  No blood, no straining but feels like she is "constantly blocked ".  We will start MiraLAX.  Although prescribed at up to twice daily, we did discuss starting an every other day and slowly increasing doses to titrated effect of wanting 1 soft bowel movement either every day or every other day.  Patient understands

## 2019-10-22 ENCOUNTER — Other Ambulatory Visit: Payer: No Typology Code available for payment source

## 2019-10-28 ENCOUNTER — Other Ambulatory Visit: Payer: Self-pay | Admitting: Family Medicine

## 2019-10-28 DIAGNOSIS — Z794 Long term (current) use of insulin: Secondary | ICD-10-CM

## 2019-10-28 DIAGNOSIS — E119 Type 2 diabetes mellitus without complications: Secondary | ICD-10-CM

## 2019-10-31 ENCOUNTER — Other Ambulatory Visit: Payer: Self-pay

## 2019-10-31 ENCOUNTER — Other Ambulatory Visit: Payer: No Typology Code available for payment source

## 2019-10-31 DIAGNOSIS — I10 Essential (primary) hypertension: Secondary | ICD-10-CM

## 2019-11-01 LAB — BASIC METABOLIC PANEL
BUN/Creatinine Ratio: 10 — ABNORMAL LOW (ref 12–28)
BUN: 11 mg/dL (ref 8–27)
CO2: 22 mmol/L (ref 20–29)
Calcium: 9.3 mg/dL (ref 8.7–10.3)
Chloride: 98 mmol/L (ref 96–106)
Creatinine, Ser: 1.15 mg/dL — ABNORMAL HIGH (ref 0.57–1.00)
GFR calc Af Amer: 59 mL/min/{1.73_m2} — ABNORMAL LOW (ref >59)
GFR calc non Af Amer: 51 mL/min/{1.73_m2} — ABNORMAL LOW (ref >59)
Glucose: 270 mg/dL — ABNORMAL HIGH (ref 65–99)
Potassium: 4 mmol/L (ref 3.5–5.2)
Sodium: 137 mmol/L (ref 134–144)

## 2019-11-04 ENCOUNTER — Other Ambulatory Visit: Payer: Self-pay | Admitting: Family Medicine

## 2019-11-04 DIAGNOSIS — G4700 Insomnia, unspecified: Secondary | ICD-10-CM

## 2019-11-07 MED FILL — ZOLPIDEM TARTRATE 10 MG TAB: 10 | 30 days supply | Qty: 30 | Fill #0

## 2019-11-07 MED FILL — traMADol HCL 50 MG TABS: 50 | 30 days supply | Qty: 30 | Fill #0

## 2019-11-20 ENCOUNTER — Ambulatory Visit: Payer: No Typology Code available for payment source | Admitting: Family Medicine

## 2019-12-04 ENCOUNTER — Encounter: Payer: Self-pay | Admitting: Family Medicine

## 2019-12-04 ENCOUNTER — Other Ambulatory Visit: Payer: Self-pay

## 2019-12-04 ENCOUNTER — Other Ambulatory Visit: Payer: Self-pay | Admitting: Family Medicine

## 2019-12-04 ENCOUNTER — Ambulatory Visit (INDEPENDENT_AMBULATORY_CARE_PROVIDER_SITE_OTHER): Payer: Self-pay | Admitting: Family Medicine

## 2019-12-04 VITALS — BP 144/82 | HR 99 | Wt 182.8 lb

## 2019-12-04 DIAGNOSIS — I10 Essential (primary) hypertension: Secondary | ICD-10-CM

## 2019-12-04 DIAGNOSIS — E1169 Type 2 diabetes mellitus with other specified complication: Secondary | ICD-10-CM

## 2019-12-04 DIAGNOSIS — M7989 Other specified soft tissue disorders: Secondary | ICD-10-CM

## 2019-12-04 DIAGNOSIS — Z794 Long term (current) use of insulin: Secondary | ICD-10-CM

## 2019-12-04 DIAGNOSIS — G4700 Insomnia, unspecified: Secondary | ICD-10-CM

## 2019-12-04 DIAGNOSIS — E785 Hyperlipidemia, unspecified: Secondary | ICD-10-CM

## 2019-12-04 DIAGNOSIS — E119 Type 2 diabetes mellitus without complications: Secondary | ICD-10-CM

## 2019-12-04 MED ORDER — AMLODIPINE BESYLATE 5 MG PO TABS: 5.0000 mg | ORAL_TABLET | Freq: Every day | ORAL | 3 refills | Status: DC

## 2019-12-04 MED ORDER — ZOLPIDEM TARTRATE 10 MG PO TABS: 10.0000 mg | ORAL_TABLET | Freq: Every evening | ORAL | 0 refills | Status: DC | PRN

## 2019-12-04 MED ORDER — TRAMADOL HCL 50 MG PO TABS: ORAL_TABLET | ORAL | 0 refills | Status: DC

## 2019-12-04 MED ORDER — MIRTAZAPINE 15 MG PO TABS: 15.0000 mg | ORAL_TABLET | Freq: Every day | ORAL | 4 refills | Status: DC

## 2019-12-04 MED ORDER — ATORVASTATIN CALCIUM 40 MG PO TABS: 40.0000 mg | ORAL_TABLET | Freq: Every day | ORAL | 3 refills | Status: DC

## 2019-12-04 NOTE — Assessment & Plan Note (Signed)
Ordering uric acid to check for potential of gout being cause of thumb pain.  Patient will stop taking hydrochlorothiazide for few days until we get the results back.  Do not see any indication to tap or any suspicion of septic joint.  Can use over-the-counter arthritis creams for this.

## 2019-12-04 NOTE — Assessment & Plan Note (Signed)
Patient still uncontrolled after being on lisinopril 40+ hydrochlorothiazide 25 for the last month.   Given new thumb swelling will stop hydrochlorothiazide for a few days while uric acid is being ran, will start amlodipine 5 mg.  Discussed potential symptoms of leg swelling versus lightheadedness and patient will let us know if these happen and stop taking amlodipine.  Can check back in a few weeks.

## 2019-12-04 NOTE — Patient Instructions (Signed)
Today we talked about the new pain in her thumb.  You can use some over-the-counter arthritis cream or Aspercreme or IcyHot in the pharmacy.  We also are going to test your blood for something called uric acid which should give [place] an idea if you having gout problems.  Until that results we will have you stop taking the hydrochlorothiazide.  I will call you and tell you if it is time to restart that if this test comes back negative.  As your blood pressure was still a little high were going to add another medicine called amlodipine, if you get new swelling in your legs or any lightheadedness please stop taking the amlodipine and schedule an appointment.

## 2019-12-04 NOTE — Progress Notes (Signed)
    Subjective:  [name] is a 64 y.o. female who presents to the [facility] today with a chief complaint of hypertension follow-up.   HPI: Patient here for follow-up for new hypertension medication which was increase of hydro-chlorothiazide from 12.5 mg daily to 25.  This took place about a month ago, she said about 2 weeks ago she started noticing a painful swelling at the distal joint of her right thumb.  Is been no erythema but there has been tenderness with some pain with full flexion, no new injury or classic tophi.  Patient does state that she has a history in her family of gout but has never had trouble with it herself.  She says that she is generally well-hydrated and drinks a lot of water.  Objective:  Physical Exam: BP (!) 144/82   Pulse 99   Wt 182 lb 12.8 oz (82.9 kg)   SpO2 98%   BMI 31.38 kg/m   Gen: NAD,  conversing comfortably CV: RRR with no murmurs appreciated Pulm: NWOB, CTAB with no crackles, wheezes, or rhonchi GI: Normal bowel sounds present. Soft, Nontender, Nondistended. MSK: no edema, cyanosis, or clubbing noted, mild asymmetry of distal right thumb joint over left.  No classic tophi or palpable warmth.  No obvious purulence or injury. Skin: warm, dry Neuro: grossly normal, moves all extremities Psych: Normal affect and thought content  No results found for this or any previous visit (from the past 72 hour(s)).   Assessment/Plan:  Thumb swelling Ordering uric acid to check for potential of gout being cause of thumb pain.  Patient will stop taking hydrochlorothiazide for few days until we get the results back.  Do not see any indication to tap or any suspicion of septic joint.  Can use over-the-counter arthritis creams for this.  HYPERTENSION, BENIGN SYSTEMIC Patient still uncontrolled after being on lisinopril 40+ hydrochlorothiazide 25 for the last month.   Given new thumb swelling will stop hydrochlorothiazide for a few days while uric acid is being ran,  will start amlodipine 5 mg.  Discussed potential symptoms of leg swelling versus lightheadedness and patient will let us know if these happen and stop taking amlodipine.  Can check back in a few weeks.   Marthenia Rolling, DO FAMILY MEDICINE RESIDENT - PGY3 12/04/2019 7:56 PM

## 2019-12-05 ENCOUNTER — Telehealth: Payer: Self-pay | Admitting: Family Medicine

## 2019-12-05 DIAGNOSIS — M10241 Drug-induced gout, right hand: Secondary | ICD-10-CM

## 2019-12-05 LAB — URIC ACID: Uric Acid: 7.6 mg/dL — ABNORMAL HIGH (ref 3.0–7.2)

## 2019-12-05 MED ORDER — INDOMETHACIN 25 MG PO CAPS: 25.0000 mg | ORAL_CAPSULE | Freq: Three times a day (TID) | ORAL | 0 refills | Status: AC

## 2019-12-05 MED FILL — ZOLPIDEM TARTRATE 10 MG TAB: 10 | 30 days supply | Qty: 30 | Fill #0

## 2019-12-05 MED FILL — traMADol HCL 50 MG TABS: 50 | 30 days supply | Qty: 30 | Fill #0

## 2019-12-05 NOTE — Telephone Encounter (Signed)
Spoke to pt. The first appt available with Dr. Parke Simmers is 01/07/20. Please let me know if we need to change this appt as Dr. Parke Simmers had requested next week. Sunday Spillers, CMA

## 2019-12-05 NOTE — Telephone Encounter (Signed)
Please call patient.: "Dr. Parke Simmers said your test came back showing your thumb problem is likely gout, the main treatment plan so far is to not take any HCTZ which should help it calm down.  If your thumb is still hurting, Dr. Parke Simmers sent in 5 days of a medicine called indomethecin (a strong medicine like ibuprofen so don't take this with ibuprofen).  He'd like to have a telephone visit with you next week (you don't have to come in) to discuss the longer term plan for balancing your blood pressure and this new issue with your thumb"     Patient with recent increase in HCTZ now with thmb swelling and 7.6 uric acid diagnosed as medication induced gout, does have family hx of gout.  Stop hctz (added to allergy list), Offered 5 days indomethecin and will have televisit next week to discuss plan moving forward

## 2019-12-10 NOTE — Telephone Encounter (Signed)
Spoke to pt per Dr. Tedra [place] request. Pt states her thumb is improving. Informed pt that if her thumb gets worse or she thinks it is infected she should come in person to see a Dr. before her appt on 2/23. Confirmed pt has stopped taking HCTZ as requested by Dr. Parke Simmers. Pt had good understand of conversation. Sunday Spillers, CMA

## 2019-12-12 ENCOUNTER — Telehealth: Payer: Self-pay | Admitting: *Deleted

## 2019-12-12 DIAGNOSIS — M7989 Other specified soft tissue disorders: Secondary | ICD-10-CM

## 2019-12-12 MED ORDER — INDOMETHACIN 25 MG PO CAPS: 25.0000 mg | ORAL_CAPSULE | Freq: Three times a day (TID) | ORAL | 0 refills | Status: AC

## 2019-12-12 NOTE — Telephone Encounter (Signed)
Pt calls because the indomethacin is not carried by the [facility].  She would like it resent to [employer]. Jone Baseman, CMA

## 2019-12-25 ENCOUNTER — Ambulatory Visit: Payer: No Typology Code available for payment source

## 2019-12-25 ENCOUNTER — Other Ambulatory Visit: Payer: Self-pay | Admitting: Family Medicine

## 2019-12-26 NOTE — Telephone Encounter (Signed)
Patient calls nurse line regarding significant increase in pain in finger. Pt reports that she is unable to use right hand or bend thumb. Per previous note, advised patient to be seen tomorrow in office. Office visit scheduled with Dr. Mauri Reading.   To PCP  Veronda Prude, RN

## 2019-12-27 ENCOUNTER — Encounter: Payer: Self-pay | Admitting: Family Medicine

## 2019-12-27 ENCOUNTER — Ambulatory Visit: Payer: No Typology Code available for payment source

## 2019-12-27 ENCOUNTER — Other Ambulatory Visit: Payer: Self-pay

## 2019-12-27 ENCOUNTER — Ambulatory Visit (INDEPENDENT_AMBULATORY_CARE_PROVIDER_SITE_OTHER): Payer: Self-pay | Admitting: Family Medicine

## 2019-12-27 VITALS — BP 140/70 | HR 118 | Wt 186.0 lb

## 2019-12-27 DIAGNOSIS — Z23 Encounter for immunization: Secondary | ICD-10-CM

## 2019-12-27 DIAGNOSIS — M1811 Unilateral primary osteoarthritis of first carpometacarpal joint, right hand: Secondary | ICD-10-CM

## 2019-12-27 NOTE — Progress Notes (Signed)
   Subjective:   Patient ID: [name]    DOB: July 07, 1956, 64 y.o. female   MRN: [id number]  [name] is a 64 y.o. female right thumb pain.  Thumb pain/swelling: Patient seen on 1/20 for thumb pain and swelling. Uric acid level elevated to 7.6. She was instructed to discontinue HCTZ and treat with OTC arthritis cream. She was also rx'd Indomethacin which she notes she took to completion. She notes it did get better. But starting yesterday she had a sharp pain throughout her thumb, some pain at the base of her thumb, and now pain is starting to radiate up the radial aspect of her wrist. She notes the pain was constant yesterday. Denies taking HCTZ anymore. Denies any red meats or alcohol use. Denies any numbness or tingling. Denies any trauma. Does have a family history of gout. 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 Denies any other joint involvement.   Review of Systems:  Per HPI.   PMFSH, medications and smoking status reviewed.  Objective:   BP 140/70   Pulse (!) 118   Wt 186 lb (84.4 kg)   SpO2 97%   BMI 31.93 kg/m  Vitals and nursing note reviewed.  General: pleasant older AA female, well nourished, well developed, in no acute distress with non-toxic appearance Resp: Breathing comfortably on room air, speaking in full sentences  Skin: warm, dry Extremities: warm and well perfused MSK: see below, no other joint swelling appreciated Neuro: Alert and oriented, speech normal  Right Hand: Inspection: No obvious deformity. Very mild swelling of right thumb, no erythema or bruising Palpation: TTP at base of thumb, otherwise hand and wrist nontender ROM: Full ROM of the digits and wrist Strength: 5/5 strength in the forearm, wrist and interosseus muscles Neurovascular: NV intact Special tests: Negative finkelstein's, negative tinel's at the carpal tunnel, negative Phalen's  Left hand: Inspection: No  obvious deformity. 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  No significant swelling, erythem or warmth to indicate infection or acute gout flare. Was just recently treated for gout with resolution of her symptoms. Does not appear consistent with another gout flare. Recommended conservative treatment at this time. If no improvement, can consider referral to Hand Clinic for custom hand brace and exercise therapy vs x-ray to evaluate further.  - Recommend Thumb spica brace while awake - Topical analgesics such as Voltaren gel, Blue Emu, Ice-hot, capsaicin cream  - PO tylenol for pain PRN, Ice/Heat PRN  - Recommended to avoid PO NSAIDs given HTN and recent high dose NSAIDs for gout treatment - activity as tolerated  - RTC 4-6 weeks if no improvement   Health Maintenance: Due for Flu and TDAP. Both given today.  Orders Placed This Encounter  Procedures  . Tdap vaccine greater than or equal to 7yo IM  . Flu Vaccine QUAD 36+ mos IM    Orpah Cobb, DO PGY-2, [facility] 12/29/2019 1:27 PM

## 2019-12-27 NOTE — Patient Instructions (Signed)
These are the different medications you can take for this: I recommend you buy a Thumb Spica brace to wear when you are awake.  I recommend Voltaren gel, Blue Emu, or Ice hot. You can also try Capsaicin, aspercreme, or biofreeze topically up to four times a day may also help with pain. Tylenol 500mg  1-2 tabs three times a day for pain. Cortisone injections are an option if pain is severe. Heat or ice 15 minutes at a time 3-4 times a day as needed to help with pain. Follow up with me in 6 weeks or as needed if you're doing well.

## 2019-12-29 DIAGNOSIS — M1811 Unilateral primary osteoarthritis of first carpometacarpal joint, right hand: Secondary | ICD-10-CM | POA: Insufficient documentation

## 2019-12-29 NOTE — Assessment & Plan Note (Signed)
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  No significant swelling, erythem or warmth to indicate infection or acute gout flare. Was just recently treated for gout with resolution of her symptoms. Does not appear consistent with another gout flare. Recommended conservative treatment at this time. If no improvement, can consider referral to Hand Clinic for custom hand brace and exercise therapy vs x-ray to evaluate further.  - Recommend Thumb spica brace while awake - Topical analgesics such as Voltaren gel, Blue Emu, Ice-hot, capsaicin cream  - PO tylenol for pain PRN, Ice/Heat PRN  - Recommended to avoid PO NSAIDs given HTN and recent high dose NSAIDs for gout treatment - activity as tolerated  - RTC 4-6 weeks if no improvement

## 2019-12-30 ENCOUNTER — Other Ambulatory Visit: Payer: Self-pay

## 2019-12-30 MED ORDER — TRAMADOL HCL 50 MG PO TABS: ORAL_TABLET | ORAL | 0 refills | Status: DC

## 2019-12-30 NOTE — Telephone Encounter (Signed)
Patient calls nurse line stating she is out of Tramadol. Patient stated she has been taking 2 tabs during the day and 2 tabs before she goes to bed. Patient stated she is due for another refill she believes, last refilled on 1/20. Please advise.

## 2020-01-01 ENCOUNTER — Other Ambulatory Visit: Payer: Self-pay | Admitting: *Deleted

## 2020-01-01 MED ORDER — INSULIN GLARGINE 100 UNIT/ML ~~LOC~~ SOLN: SUBCUTANEOUS | 0 refills | Status: DC

## 2020-01-01 MED ORDER — INSULIN ASPART 100 UNIT/ML ~~LOC~~ SOLN: SUBCUTANEOUS | 0 refills | Status: DC

## 2020-01-03 ENCOUNTER — Other Ambulatory Visit: Payer: Self-pay | Admitting: Family Medicine

## 2020-01-06 ENCOUNTER — Other Ambulatory Visit: Payer: Self-pay

## 2020-01-06 DIAGNOSIS — G4700 Insomnia, unspecified: Secondary | ICD-10-CM

## 2020-01-06 MED FILL — traMADol HCL 50 MG TABS: 50 | 30 days supply | Qty: 60 | Fill #0

## 2020-01-06 NOTE — Telephone Encounter (Signed)
Med was set to print.  Called in verbally to [facility] outpatient as requested by patient. Jone Baseman, CMA

## 2020-01-07 ENCOUNTER — Telehealth (INDEPENDENT_AMBULATORY_CARE_PROVIDER_SITE_OTHER): Payer: Self-pay | Admitting: Family Medicine

## 2020-01-07 DIAGNOSIS — N952 Postmenopausal atrophic vaginitis: Secondary | ICD-10-CM

## 2020-01-07 DIAGNOSIS — M1811 Unilateral primary osteoarthritis of first carpometacarpal joint, right hand: Secondary | ICD-10-CM

## 2020-01-07 DIAGNOSIS — I10 Essential (primary) hypertension: Secondary | ICD-10-CM

## 2020-01-07 MED ORDER — ESTROGENS CONJUGATED 0.45 MG PO TABS: ORAL_TABLET | ORAL | 0 refills | Status: DC

## 2020-01-07 MED ORDER — ZOLPIDEM TARTRATE 10 MG PO TABS: 10.0000 mg | ORAL_TABLET | Freq: Every evening | ORAL | 0 refills | Status: DC | PRN

## 2020-01-07 MED FILL — ZOLPIDEM TARTRATE 10 MG TAB: 10 | 30 days supply | Qty: 30 | Fill #0

## 2020-01-07 NOTE — Assessment & Plan Note (Signed)
Improvement has been consistent with no new flares.  Can continue monitor, now question of osteoarthritis versus gout

## 2020-01-07 NOTE — Assessment & Plan Note (Signed)
Unable to check BP at home, most recent change included stopping HCTZ and replacing with amlodipine 5 mg due to new monoarticular thumb swelling after increase of HCTZ to 25 from 12.5.  We will continue with current dosing and can recheck BP at next office visit.

## 2020-01-07 NOTE — Progress Notes (Signed)
[facility] Telemedicine Visit  Patient consented to have virtual visit. Method of visit: Video was attempted, but technology challenges prevented patient from using video, so visit was conducted via telephone.  Encounter participants: Patient: [name] - located at home Provider: Marthenia Rolling - located at [facility] clinic  Chief Complaint: f/u for BP and thumb pain  HPI: Thumb is feeling better, has been a prior resolution to the initial flare of pain that I had thought was gout due to its timing with a increase in HCTZ as well as its monoarticular presentation.  After it had resolved there was a repeat of her pain significantly in the same thumb joint which she went to see Dr. Rachelle Hora about who thought that it was more likely to be osteoarthritis.  Conservative treatment has been working well for her she has no more bone pain complaints.    Regarding BP as no complaints with transition to amlodipine from HCTZ over concern for gout, is otherwise doing well but does not have blood pressure cuff at home,  thinks she is still having some minor irritation vaginally and would like to slightly increase her Premarin dose.  No other vaginal urinary complaints expressed  ROS: per HPI  Pertinent PMHx: Prior monoarticular thumb pain after increasing HCTZ, resolved with transition from HCTZ to amlodipine.  BP stable but moderately uncontrolled in the 150 systolic at last check which is why HCTZ has been increased, now with transition to amlodipine and lisinopril  Exam:  Respiratory: Speaking in full sentences with no respiratory complaints over the phone, no indication of respiratory distress  Assessment/Plan:  Arthritis of carpometacarpal [facility]) joint of right thumb Improvement has been consistent with no new flares.  Can continue monitor, now question of osteoarthritis versus gout  Atrophic vaginitis Still complaining of vaginal dryness, no urinary or discharge symptoms  described.  Can increase dose to 0.45 from 0.3.  HYPERTENSION, BENIGN SYSTEMIC Unable to check BP at home, most recent change included stopping HCTZ and replacing with amlodipine 5 mg due to new monoarticular thumb swelling after increase of HCTZ to 25 from 12.5.  We will continue with current dosing and can recheck BP at next office visit.    Time spent during visit with patient: 12 minutes

## 2020-01-07 NOTE — Assessment & Plan Note (Signed)
Still complaining of vaginal dryness, no urinary or discharge symptoms described.  Can increase dose to 0.45 from 0.3.

## 2020-01-15 ENCOUNTER — Other Ambulatory Visit: Payer: Self-pay | Admitting: Family Medicine

## 2020-01-15 DIAGNOSIS — I10 Essential (primary) hypertension: Secondary | ICD-10-CM

## 2020-01-20 ENCOUNTER — Other Ambulatory Visit: Payer: Self-pay | Admitting: Family Medicine

## 2020-01-20 DIAGNOSIS — I10 Essential (primary) hypertension: Secondary | ICD-10-CM

## 2020-01-29 ENCOUNTER — Other Ambulatory Visit: Payer: Self-pay | Admitting: Family Medicine

## 2020-02-03 ENCOUNTER — Other Ambulatory Visit: Payer: Self-pay | Admitting: Family Medicine

## 2020-02-03 DIAGNOSIS — G4700 Insomnia, unspecified: Secondary | ICD-10-CM

## 2020-02-04 NOTE — Telephone Encounter (Signed)
Pt calling to check status  °

## 2020-02-05 NOTE — Telephone Encounter (Signed)
Patient calling nurse line again to check on status of rx refill.   To PCP  Veronda Prude, RN

## 2020-02-05 NOTE — Telephone Encounter (Signed)
Pt calling to follow up on status of rx refill  Veronda Prude, RN

## 2020-02-07 MED FILL — ZOLPIDEM TARTRATE 10 MG TAB: 10 | 30 days supply | Qty: 30 | Fill #0

## 2020-02-07 NOTE — Telephone Encounter (Signed)
Patient calling again to check the status 

## 2020-02-10 ENCOUNTER — Other Ambulatory Visit: Payer: Self-pay | Admitting: Family Medicine

## 2020-02-11 MED FILL — traMADol HCL 50 MG TABS: 50 | 30 days supply | Qty: 60 | Fill #0

## 2020-03-04 ENCOUNTER — Other Ambulatory Visit: Payer: Self-pay | Admitting: Family Medicine

## 2020-03-04 DIAGNOSIS — G4700 Insomnia, unspecified: Secondary | ICD-10-CM

## 2020-03-05 MED FILL — ZOLPIDEM TARTRATE 10 MG TAB: 10 | 30 days supply | Qty: 30 | Fill #0

## 2020-03-06 ENCOUNTER — Other Ambulatory Visit: Payer: Self-pay

## 2020-03-06 ENCOUNTER — Encounter: Payer: Self-pay | Admitting: Family Medicine

## 2020-03-06 ENCOUNTER — Ambulatory Visit (INDEPENDENT_AMBULATORY_CARE_PROVIDER_SITE_OTHER): Payer: Self-pay | Admitting: Family Medicine

## 2020-03-06 VITALS — BP 158/82 | HR 84 | Wt 181.1 lb

## 2020-03-06 DIAGNOSIS — Z794 Long term (current) use of insulin: Secondary | ICD-10-CM

## 2020-03-06 DIAGNOSIS — M62838 Other muscle spasm: Secondary | ICD-10-CM

## 2020-03-06 DIAGNOSIS — E119 Type 2 diabetes mellitus without complications: Secondary | ICD-10-CM

## 2020-03-06 DIAGNOSIS — I10 Essential (primary) hypertension: Secondary | ICD-10-CM

## 2020-03-06 LAB — POCT GLYCOSYLATED HEMOGLOBIN (HGB A1C): HbA1c, POC (controlled diabetic range): 7.4 % — AB (ref 0.0–7.0)

## 2020-03-06 MED ORDER — AMLODIPINE BESYLATE 5 MG PO TABS: 10.0000 mg | ORAL_TABLET | Freq: Every day | ORAL | 0 refills | Status: DC

## 2020-03-06 MED ORDER — TRAMADOL HCL 50 MG PO TABS: ORAL_TABLET | ORAL | 0 refills | Status: DC

## 2020-03-06 MED ORDER — CYCLOBENZAPRINE HCL 10 MG PO TABS: 10.0000 mg | ORAL_TABLET | Freq: Three times a day (TID) | ORAL | 0 refills | Status: AC | PRN

## 2020-03-06 MED ORDER — CYCLOBENZAPRINE HCL 10 MG PO TABS: 10.0000 mg | ORAL_TABLET | Freq: Three times a day (TID) | ORAL | 0 refills | Status: DC | PRN

## 2020-03-06 NOTE — Patient Instructions (Signed)
Please remember to not take HCTZ.  We're going to increase your amlodipine to 10mg  from 5mg  for your blood pressure.  And I'm giving 10 days flexeril for the muscle spasm in your back.  -Dr. 

## 2020-03-06 NOTE — Progress Notes (Signed)
Patient calls nurse line requesting rx for flexeril be transferred to the [facility]. Called and spoke with Dawn at [facility] and cancelled rx.   Veronda Prude, RN

## 2020-03-06 NOTE — Progress Notes (Signed)
    SUBJECTIVE:   CHIEF COMPLAINT / HPI: Follow-up on diabetes  Patient has been taking all medication as prescribed, says that she has been trying with her diet.  She has no change in symptoms beyond her chronic conditions and is otherwise feeling well.  She is here because she is been instructed to come in every few months and check in.  PERTINENT  PMH / PSH:   OBJECTIVE:   BP (!) 158/82   Pulse 84   Wt 181 lb 1.6 oz (82.1 kg)   SpO2 100%   BMI 31.09 kg/m   General: Alert and pleasant, appropriately discussing medications.  Appears at baseline  ASSESSMENT/PLAN:   Diabetes mellitus (HCC) A1c improved to 7.4, continue current plan as patient wishes to continue attempting to improve with diet control.  Muscle spasm Patient occasionally uses tramadol for muscle spasms, this long-term and chronic.  She has not asked for an increase.  Refill chronic meds  HYPERTENSION, BENIGN SYSTEMIC We discussed patient that her current regimen is uncontrolled and discussed going up on the amlodipine to 10 mg to see how this works for her.     Marthenia Rolling, DO [facility]

## 2020-03-09 DIAGNOSIS — M62838 Other muscle spasm: Secondary | ICD-10-CM | POA: Insufficient documentation

## 2020-03-09 NOTE — Assessment & Plan Note (Signed)
We discussed patient that her current regimen is uncontrolled and discussed going up on the amlodipine to 10 mg to see how this works for her.

## 2020-03-09 NOTE — Assessment & Plan Note (Signed)
A1c improved to 7.4, continue current plan as patient wishes to continue attempting to improve with diet control.

## 2020-03-09 NOTE — Assessment & Plan Note (Signed)
Patient occasionally uses tramadol for muscle spasms, this long-term and chronic.  She has not asked for an increase.  Refill chronic meds

## 2020-03-12 MED FILL — traMADol HCL 50 MG TABS: 50 | 30 days supply | Qty: 60 | Fill #0

## 2020-03-16 ENCOUNTER — Telehealth: Payer: Self-pay

## 2020-03-16 NOTE — Telephone Encounter (Signed)
Patient calls nurse line regarding BLE swelling and itching. Patient reports onset yesterday afternoon. Patient states that she is concerned that she is having a reaction to new medication regimen. Patient prescribed flexeril and increase in amlodipine on 03/06/20. Patient denies hives, shortness of breath, difficulty breathing or chest pain.   Precepted with Dr. Sheffield Slider. Per Dr. Sheffield Slider, patient should discontinue flexeril and continue on current amlodipine dosage. Patient instructed to keep legs elevated and to reduce sodium intake. Instructed patient to follow up with PCP if swelling is persistent/worsens or any development of new symptoms. Strict ED precautions given. Patient verbalizes understanding.   To PCP  Veronda Prude, RN

## 2020-03-26 ENCOUNTER — Other Ambulatory Visit: Payer: Self-pay | Admitting: Family Medicine

## 2020-03-30 ENCOUNTER — Ambulatory Visit: Payer: No Typology Code available for payment source | Admitting: Family Medicine

## 2020-04-01 ENCOUNTER — Other Ambulatory Visit: Payer: Self-pay | Admitting: Family Medicine

## 2020-04-03 ENCOUNTER — Ambulatory Visit: Payer: No Typology Code available for payment source | Admitting: Family Medicine

## 2020-04-04 ENCOUNTER — Other Ambulatory Visit: Payer: Self-pay

## 2020-04-04 ENCOUNTER — Ambulatory Visit (HOSPITAL_COMMUNITY)
Admission: EM | Admit: 2020-04-04 | Discharge: 2020-04-04 | Disposition: A | Discharge status: Home / Self Care | Payer: No Typology Code available for payment source | Attending: Physician Assistant | Admitting: Physician Assistant

## 2020-04-04 ENCOUNTER — Encounter (HOSPITAL_COMMUNITY): Payer: Self-pay

## 2020-04-04 ENCOUNTER — Ambulatory Visit (INDEPENDENT_AMBULATORY_CARE_PROVIDER_SITE_OTHER): Payer: No Typology Code available for payment source

## 2020-04-04 DIAGNOSIS — S8002XA Contusion of left knee, initial encounter: Secondary | ICD-10-CM

## 2020-04-04 DIAGNOSIS — S62665A Nondisplaced fracture of distal phalanx of left ring finger, initial encounter for closed fracture: Secondary | ICD-10-CM

## 2020-04-04 NOTE — ED Provider Notes (Signed)
[place]    CSN: [id number] Arrival date & time: 04/04/20  1717      History   Chief Complaint Chief Complaint  Patient presents with  . Finger Injury  . Knee Pain    HPI [name] is a 64 y.o. female.   Patient reports for evaluation of finger injury and knee pain.  She reports she was carrying a case of water up her stairs earlier today when the case fell injuring her left ring finger and she fell forward onto her left knee.  She reports 2 out of 10 left knee pain with walking.  She describes it is achy.  She reports the left ring finger hurts towards the end.  She reports she has not been able to bend it all the way.  Denies numbness or tingling.     Past Medical History:  Diagnosis Date  . Anemia   . Benign heart murmur   . Diabetes mellitus   . Gastric ulcer   . History of colonoscopy   . Hyperlipidemia   . Hypertension   . Stroke [facility])    2008 - right arm/leg numbness.       Patient Active Problem List   Diagnosis Date Noted  . Muscle spasm 03/09/2020  . Arthritis of carpometacarpal [facility]) joint of right thumb 12/29/2019  . Thumb swelling 12/04/2019  . Atrophic vaginitis 01/10/2019  . Acute cystitis 01/08/2019  . Screen for colon cancer 09/21/2018  . Chronic pain 09/14/2017  . Low back pain 05/06/2013  . Diabetes mellitus ([place]) 10/15/2012  . Cervical pain (neck) 03/14/2012  . Insomnia 10/10/2011  . Hallux rigidus of right foot 08/29/2011  . CATARACTS 12/16/2009  . SMOKELESS TOBACCO ABUSE 05/14/2008  . OBESITY 11/30/2007  . DIVERTICULOSIS, COLON W/HEM 08/15/2007  . History of TIA (transient ischemic attack) 02/12/2007  . Hyperlipidemia associated with type 2 diabetes mellitus ([place]) 01/11/2007  . Iron deficiency anemia 01/11/2007  . HYPERTENSION, BENIGN SYSTEMIC 01/11/2007    Past Surgical History:  Procedure Laterality Date  . BREAST BIOPSY    . BREAST EXCISIONAL BIOPSY    . BTL  1988  . COLONOSCOPY  01/13/2004   Colonoscopy -  normal, diverticulosis   . ENDOMETRIAL BIOPSY     Hx of endometrial Bx 2004 - negative for hyperplasia or malignancy. Patient does not remember reason for testing.  . ESOPHAGOGASTRODUODENOSCOPY     normal  . L and R breast lumpectomies benign  1970    OB History    Gravida  3   Para      Term      Preterm      AB      Living  3     SAB      TAB      Ectopic      Multiple      Live Births  3            Home Medications    Prior to Admission medications   Medication Sig Start Date End Date Taking? Authorizing Provider  amLODipine (NORVASC) 5 MG tablet Take 2 tablets (10 mg total) by mouth at bedtime. 03/06/20   Sherene Sires, DO  aspirin 81 MG tablet Take 81 mg by mouth daily.    Gregor Hams, MD  atorvastatin (LIPITOR) 40 MG tablet Take 1 tablet (40 mg total) by mouth daily. 12/04/19   Sherene Sires, DO  conjugated estrogens (PREMARIN) vaginal cream Place 1 Applicatorful vaginally daily. 01/18/19  Marthenia Rolling, DO  estrogens, conjugated, (PREMARIN) 0.45 MG tablet TAKE 1 TABLET (0.3MG  TOTAL) BY MOUTH DAILY FOR 21 DAYS. TAKE DAILY FOR 21 DAYS THEN DO NOT TAKE FOR 7 DAYS. 01/07/20   Marthenia Rolling, DO  glucose blood test strip Freestyle Freedom Strips. Check sugars 4 times a day. 11/09/16   Arvilla Market, DO  insulin aspart (NOVOLOG) 100 UNIT/ML injection INJECT 3 UNITS INTO THE SKIN 3 TIMES A DAY WITH MEALS 03/26/20   Bland, Scott, DO  insulin glargine (LANTUS) 100 UNIT/ML injection INJECT 7 UNITS INTO THE SKIN DAILY. 03/26/20   Marthenia Rolling, DO  Insulin Syringe-Needle U-100 (B-D INS SYR ULTRAFINE .5CC/30G) 30G X 1/2" 0.5 ML MISC Inject 1 Syringe into the skin 4 (four) times daily - after meals and at bedtime. daily use of insulin 1 box - supply for one month 11/09/16   Arvilla Market, DO  Lancets Fine 28G MISC Check sugars twice a day     [provider]  lisinopril (ZESTRIL) 20 MG tablet TAKE TWO TABLETS BY MOUTH DAILY 01/20/20   Marthenia Rolling,  DO  metFORMIN (GLUCOPHAGE) 1000 MG tablet TAKE ONE TABLET BY MOUTH TWICE A DAY WITH A MEAL 07/29/19   Marthenia Rolling, DO  mirtazapine (REMERON) 15 MG tablet Take 1 tablet (15 mg total) by mouth at bedtime. 12/04/19   Marthenia Rolling, DO  polyethylene glycol powder (GLYCOLAX/MIRALAX) 17 GM/SCOOP powder Take 17 g by mouth daily. 10/14/19   Bland, Scott, DO  PREMARIN 0.3 MG tablet TAKE 1 TABLET (0.3 MG TOTAL) BY MOUTH DAILY FOR 21 DAYS. TAKE DAILY FOR 21 DAYS THEN DO NOT TAKE FOR 7 DAYS. 03/05/20   Marthenia Rolling, DO  traMADol (ULTRAM) 50 MG tablet TAKE 1 TABLET BY MOUTH TWO TIMES DAILY AS NEEDED **30 DAY SUPPLY** 03/06/20   Marthenia Rolling, DO  zolpidem (AMBIEN) 10 MG tablet TAKE 1 TABLET BY MOUTH EVERY NIGHT AT BEDTIME AS NEEDED FOR SLEEP 03/07/20   Marthenia Rolling, DO    Family History Family History  Problem Relation Age of Onset  . Diabetes Mother   . Alcohol abuse Father   . Heart disease Father   . Diabetes Sister   . Colon cancer Neg Hx   . Esophageal cancer Neg Hx   . Rectal cancer Neg Hx   . Stomach cancer Neg Hx     Social History Social History   Tobacco Use  . Smoking status: Never Smoker  . Smokeless tobacco: Former Neurosurgeon    Types: Snuff  . Tobacco comment: Quit for 1.5 months in past. Current quit date 01/28/2013  Substance Use Topics  . Alcohol use: Not Currently    Comment: hx excessive ETOH, quit 02/2007  . Drug use: No     Allergies   Hctz [hydrochlorothiazide]   Review of Systems Review of Systems   Physical Exam Triage Vital Signs ED Triage Vitals  Enc Vitals Group     BP 04/04/20 1743 (!) 149/98     Pulse Rate 04/04/20 1743 (!) 110     Resp 04/04/20 1743 14     Temp 04/04/20 1743 99.8 F (37.7 C)     Temp Source 04/04/20 1743 Oral     SpO2 04/04/20 1743 98 %     Weight --      Height --      Head Circumference --      Peak Flow --      Pain Score 04/04/20 1740 10     Pain  Loc --      Pain Edu? --      Excl. in GC? --    No data found.  Updated Vital  Signs BP (!) 149/98 (BP Location: Right Arm)   Pulse (!) 110   Temp 99.8 F (37.7 C) (Oral)   Resp 14   SpO2 98%   Visual Acuity Right Eye Distance:   Left Eye Distance:   Bilateral Distance:    Right Eye Near:   Left Eye Near:    Bilateral Near:     Physical Exam Vitals and nursing note reviewed.  Constitutional:      General: She is not in acute distress.    Appearance: She is well-developed.  HENT:     Head: Normocephalic and atraumatic.  Eyes:     Conjunctiva/sclera: Conjunctivae normal.  Cardiovascular:     Rate and Rhythm: Normal rate.     Heart sounds: No murmur.  Pulmonary:     Effort: Pulmonary effort is normal. No respiratory distress.  Musculoskeletal:     Cervical back: Neck supple.     Comments: Left knee with no deformity.  No swelling.  There is very mild tenderness over the patella.  No other bony tenderness.  No joint line tenderness.  Patient is ambulatory without issue.  Joint is stable.   Left hand.:  Fourth digit with swelling between the DIP and PIP tenderness palpation over the DIP.  Unable to flex at the DIP, unclear if this is true inability or just pain driven.  There is pain elicited with passive flexion of the DIP.  There is good capillary refill and sensation.    Skin:    General: Skin is warm and dry.  Neurological:     Mental Status: She is alert.      UC Treatments / Results  Labs (all labs ordered are listed, but only abnormal results are displayed) Labs Reviewed - No data to display  EKG   Radiology DG Hand Complete Left  Result Date: 04/04/2020 CLINICAL DATA:  Finger injury, distal fourth phalanx EXAM: LEFT HAND - COMPLETE 3+ VIEW COMPARISON:  None. FINDINGS: There are nondisplaced fractures of the base of the left fourth distal phalanx, seen only on oblique view. No other fracture or dislocation. Joint spaces are preserved. Soft tissues are unremarkable. IMPRESSION: Nondisplaced fractures of the base of the left fourth  distal phalanx, seen only on oblique view. No other fracture or dislocation. Electronically Signed   By: Lauralyn Primes M.D.   On: 04/04/2020 18:40    Procedures Procedures (including critical care time)  Medications Ordered in UC Medications - No data to display  Initial Impression / Assessment and Plan / UC Course  I have reviewed the triage vital signs and the nursing notes.  Pertinent labs & imaging results that were available during my care of the patient were reviewed by me and considered in my medical decision making (see chart for details).     #Nondisplaced fracture of the proximal distal phalanx on the fourth digit #Knee contusion Patient is a 64 year old with a fracture to the fourth digit on left hand.  There is also knee contusion.  Some concern for tendon injury, will place in rigid splint and have follow-up with hand.  Patient also has primary care follow-up on 24 May.  Tylenol for pain.  She verbalized understanding plan. Final Clinical Impressions(s) / UC Diagnoses   Final diagnoses:  Closed nondisplaced fracture of distal phalanx of left ring finger, initial  encounter  Contusion of left knee, initial encounter     Discharge Instructions     Wear the splint at all times.  Call the hand doctor attached  Take 2 tylenol every 6 hours for pain  Ice your knee tonight.       ED Prescriptions    None     PDMP not reviewed this encounter.   Hermelinda Medicus, PA-C 04/04/20 1850

## 2020-04-04 NOTE — Discharge Instructions (Signed)
Wear the splint at all times.  Call the hand doctor attached  Take 2 tylenol every 6 hours for pain  Ice your knee tonight.

## 2020-04-04 NOTE — ED Triage Notes (Signed)
Patient states she was carrying a case of water up the stairs today and the case broke, injuring her left ring finger and left knee. Left ring finger is swollen with limited range of motion.

## 2020-04-06 ENCOUNTER — Other Ambulatory Visit: Payer: Self-pay

## 2020-04-06 ENCOUNTER — Ambulatory Visit (INDEPENDENT_AMBULATORY_CARE_PROVIDER_SITE_OTHER): Payer: Self-pay | Admitting: Family Medicine

## 2020-04-06 ENCOUNTER — Other Ambulatory Visit: Payer: Self-pay | Admitting: Family Medicine

## 2020-04-06 ENCOUNTER — Encounter: Payer: Self-pay | Admitting: Family Medicine

## 2020-04-06 VITALS — BP 152/74 | HR 114 | Ht 64.0 in | Wt 177.8 lb

## 2020-04-06 DIAGNOSIS — I10 Essential (primary) hypertension: Secondary | ICD-10-CM

## 2020-04-06 DIAGNOSIS — G4700 Insomnia, unspecified: Secondary | ICD-10-CM

## 2020-04-06 DIAGNOSIS — S62645A Nondisplaced fracture of proximal phalanx of left ring finger, initial encounter for closed fracture: Secondary | ICD-10-CM

## 2020-04-06 DIAGNOSIS — E785 Hyperlipidemia, unspecified: Secondary | ICD-10-CM

## 2020-04-06 DIAGNOSIS — E1169 Type 2 diabetes mellitus with other specified complication: Secondary | ICD-10-CM

## 2020-04-06 MED ORDER — TRAMADOL HCL 50 MG PO TABS: 50.0000 mg | ORAL_TABLET | Freq: Three times a day (TID) | ORAL | 0 refills | Status: DC | PRN

## 2020-04-06 MED ORDER — METOPROLOL TARTRATE 25 MG PO TABS: 25.0000 mg | ORAL_TABLET | Freq: Two times a day (BID) | ORAL | 0 refills | Status: DC

## 2020-04-06 MED FILL — ZOLPIDEM TARTRATE 10 MG TAB: 10 | 30 days supply | Qty: 30 | Fill #0

## 2020-04-06 MED FILL — traMADol HCL 50 MG TABS: 50 | 7 days supply | Qty: 21 | Fill #0

## 2020-04-06 NOTE — Progress Notes (Signed)
    SUBJECTIVE:   CHIEF COMPLAINT / HPI: Leg swelling  Patient is recently stopped amlodipine to verify if this might be contributing to her complaint of leg swelling, since she is stopped the leg swelling is gone away and she is happy with this plan.  We discussed the fact that this removes another hypertensive medication option from the plan for her, she still uncontrolled hypertensive with systolics in the 150s.  She is also had an asymptomatic minor tachycardia for quite some time with sinus rhythm.  She is willing to talk about another medication and we discussed that if we cannot figure this out soon and that we will need to refer to nephrology for refractory hypertension  PERTINENT  PMH / PSH:   OBJECTIVE:   BP (!) 152/74   Pulse (!) 114   Ht 5\' 4"  (1.626 m)   Wt 177 lb 12.8 oz (80.6 kg)   SpO2 99%   BMI 30.52 kg/m   General: Alert and pleasant at baseline Cardiac: Slightly tachycardic, regular rhythm, no murmur, no pedal edema Pulmonary: No increased work of breathing, CTA B, no wheeze  ASSESSMENT/PLAN:   HYPERTENSION, BENIGN SYSTEMIC Patient is already taking high-dose lisinopril, cannot take HCTZ due to gout flare cannot take amlodipine due to leg swelling  We will start low-dose metoprolol because patient also has asymptomatic minor tachycardia, should check back in in the next week or 2  Hyperlipidemia associated with type 2 diabetes mellitus LDL in the 80s on atorvastatin 40  No need to change medication at this time  Closed nondisplaced fracture of proximal phalanx of left ring finger Patient with incomplete, nondisplaced fracture of distal fourth proximal phalangeal.  This is mildly tender and she has a finger splint intact currently.  No distal deficits, only minor edema with no superficial bruising she was evaluated urgent care after falling at home (no syncope, she tripped)  Patient is uninsured and would prefer to avoid Ortho follow-up if we think she is  unlikely to need surgery, reviewed with sports medicine fellow we both agree that there would be no surgical intervention and assuming that she is healing appropriately she can avoid the expense of hand surgery.  Risks and benefits of this were discussed the patient and this is a plan she wants to go with     , DO [facility]

## 2020-04-06 NOTE — Patient Instructions (Addendum)
Please come back to see [place] in a week or 2 so we can look at your finger and talk about your metoprolol.  We do not think that you should likely require a hand surgeon for this I know that is a concern based on your insurance situation.  If you feel like something is getting worse with your finger please come back and see [place] immediately.  Read start this low-dose medicine called metoprolol which should help with your blood pressure and slow your heart rate down a little bit because your heart rate is always been a little bit high.  These are getting any chest pain or lightheadedness please stop taking this medicine and call us immediately.  None of the fingers been hurting so can have you take a couple extra tramadol today for next week as long as needed.  We are going to check your cholesterol today's we can find out if we need to change your cholesterol medication.  Dr. Parke Simmers

## 2020-04-07 LAB — LDL CHOLESTEROL, DIRECT: LDL Direct: 83 mg/dL (ref 0–99)

## 2020-04-08 DIAGNOSIS — S62645A Nondisplaced fracture of proximal phalanx of left ring finger, initial encounter for closed fracture: Secondary | ICD-10-CM | POA: Insufficient documentation

## 2020-04-08 HISTORY — DX: Nondisplaced fracture of proximal phalanx of left ring finger, initial encounter for closed fracture: S62.645A

## 2020-04-08 NOTE — Assessment & Plan Note (Signed)
LDL in the 80s on atorvastatin 40  No need to change medication at this time

## 2020-04-08 NOTE — Assessment & Plan Note (Signed)
Patient is already taking high-dose lisinopril, cannot take HCTZ due to gout flare cannot take amlodipine due to leg swelling  We will start low-dose metoprolol because patient also has asymptomatic minor tachycardia, should check back in in the next week or 2

## 2020-04-08 NOTE — Assessment & Plan Note (Signed)
Patient with incomplete, nondisplaced fracture of distal fourth proximal phalangeal.  This is mildly tender and she has a finger splint intact currently.  No distal deficits, only minor edema with no superficial bruising she was evaluated urgent care after falling at home (no syncope, she tripped)  Patient is uninsured and would prefer to avoid Ortho follow-up if we think she is unlikely to need surgery, reviewed with sports medicine fellow we both agree that there would be no surgical intervention and assuming that she is healing appropriately she can avoid the expense of hand surgery.  Risks and benefits of this were discussed the patient and this is a plan she wants to go with

## 2020-04-22 ENCOUNTER — Other Ambulatory Visit: Payer: Self-pay | Admitting: Family Medicine

## 2020-04-27 ENCOUNTER — Other Ambulatory Visit: Payer: Self-pay | Admitting: Family Medicine

## 2020-04-27 ENCOUNTER — Other Ambulatory Visit: Payer: Self-pay

## 2020-04-27 ENCOUNTER — Ambulatory Visit (INDEPENDENT_AMBULATORY_CARE_PROVIDER_SITE_OTHER): Payer: Self-pay | Admitting: Family Medicine

## 2020-04-27 ENCOUNTER — Encounter: Payer: Self-pay | Admitting: Family Medicine

## 2020-04-27 DIAGNOSIS — S62645A Nondisplaced fracture of proximal phalanx of left ring finger, initial encounter for closed fracture: Secondary | ICD-10-CM

## 2020-04-27 DIAGNOSIS — I10 Essential (primary) hypertension: Secondary | ICD-10-CM

## 2020-04-27 MED ORDER — METOPROLOL TARTRATE 25 MG PO TABS: 50.0000 mg | ORAL_TABLET | Freq: Two times a day (BID) | ORAL | 0 refills | Status: DC

## 2020-04-27 MED FILL — traMADol HCL 50 MG TABS: 50 | 7 days supply | Qty: 21 | Fill #0

## 2020-04-27 NOTE — Patient Instructions (Addendum)
Today we talked about your blood pressure, it seems like the metoprolol is really helping you out.   As your prior dose was 1 singular 25 mg tablet of metoprolol twice per day, we are going to double that dose.  Your new dose is: 2 tablets of 25 mg metoprolol twice per day.  Please come back in about a week for a nurse visit to get your vital signs taken.  I will double check them to make sure there is no significant problems and then will give you a phone message to confirm your long-term metoprolol dose.  If you start noticing any symptoms that make you concerned please let us know immediately.  Dr. Parke Simmers

## 2020-04-27 NOTE — Progress Notes (Signed)
° ° °  SUBJECTIVE:   CHIEF COMPLAINT / HPI: BP f/u   Patient had started metoprolol 25mg  bid two weeks ago, here for f/u.  No complaints at all, feels well  PERTINENT  PMH / PSH: htn, hx of asymptomatic low tachycardia  OBJECTIVE:   BP (!) 144/72    Pulse 87    Ht 5\' 4"  (1.626 m)    Wt 182 lb 9.6 oz (82.8 kg)    SpO2 99%    BMI 31.34 kg/m   Gen: alert, comfortable Cardiac: RRR, no mumur Lungs: CTAB, no IWB, no cough/wheeze  ASSESSMENT/PLAN:   HYPERTENSION, BENIGN SYSTEMIC Addition of low dose metoprolol 25mg  bid improved BP control but not yet to goal.  Tachycardia resolved  Will try increase to 50mg  bid metoprolol, can do nurse visit for vitals in ~1wk     , DO [facility]

## 2020-04-28 NOTE — Assessment & Plan Note (Addendum)
Addition of low dose metoprolol 25mg  bid improved BP control but not yet to goal.  Tachycardia resolved  Will try increase to 50mg  bid metoprolol, can do nurse visit for vitals in ~1wk

## 2020-05-03 ENCOUNTER — Other Ambulatory Visit: Payer: Self-pay | Admitting: Family Medicine

## 2020-05-03 DIAGNOSIS — I10 Essential (primary) hypertension: Secondary | ICD-10-CM

## 2020-05-04 ENCOUNTER — Ambulatory Visit (INDEPENDENT_AMBULATORY_CARE_PROVIDER_SITE_OTHER): Payer: Self-pay | Admitting: Family Medicine

## 2020-05-04 ENCOUNTER — Other Ambulatory Visit: Payer: Self-pay

## 2020-05-04 ENCOUNTER — Encounter: Payer: Self-pay | Admitting: Family Medicine

## 2020-05-04 ENCOUNTER — Other Ambulatory Visit: Payer: Self-pay | Admitting: Family Medicine

## 2020-05-04 VITALS — BP 142/80 | HR 91

## 2020-05-04 DIAGNOSIS — G4700 Insomnia, unspecified: Secondary | ICD-10-CM

## 2020-05-04 DIAGNOSIS — I10 Essential (primary) hypertension: Secondary | ICD-10-CM

## 2020-05-04 MED FILL — ZOLPIDEM TARTRATE 10 MG TAB: 10 | 30 days supply | Qty: 30 | Fill #0

## 2020-05-04 NOTE — Progress Notes (Signed)
I discussed this patient's case with Madelynn Done, CMA.  I agree with their plan as documented in their note for today.

## 2020-05-04 NOTE — Progress Notes (Signed)
Patient here today for BP check.      Last BP was on 04/27/20 and was 144/72 with a pulse of 87.  BP today is 142/80 with a pulse of 91.    Checked BP in left arm with normal cuff.    Symptoms present: none.   Patient last took BP med yesterday (as she will need a refill since the dose was increased).  Per Dr. Parke Simmers, he will fill her script now and request another RN visit next week.  Pt informed and agreeable and appt made for 05/11/20. Routed note to PCP.         Jone Baseman, CMA

## 2020-05-11 ENCOUNTER — Ambulatory Visit: Payer: No Typology Code available for payment source

## 2020-05-12 ENCOUNTER — Other Ambulatory Visit: Payer: Self-pay

## 2020-05-12 ENCOUNTER — Ambulatory Visit: Payer: No Typology Code available for payment source

## 2020-05-12 VITALS — BP 162/75 | HR 78

## 2020-05-12 DIAGNOSIS — I10 Essential (primary) hypertension: Secondary | ICD-10-CM

## 2020-05-12 NOTE — Progress Notes (Signed)
Patient here today for BP check.      Last BP was on 05/04/2020 and was 142/80.  BP today is 162/81 with a pulse of 78. Patient sat in room for 15 minutes. BP checked again and was 162/75. Spoke with PCP, advised BMP today and f/u appointment next week.   Checked BP in left arm with normal cuff.    Symptoms present: none.   Patient last took BP med this morning.  Routed note to PCP.     Patient scheduled for follow up on 05/22/20. Strict ED/ return precautions given.    Veronda Prude, RN

## 2020-05-13 ENCOUNTER — Encounter: Payer: Self-pay | Admitting: Family Medicine

## 2020-05-13 LAB — BASIC METABOLIC PANEL
BUN/Creatinine Ratio: 11 — ABNORMAL LOW (ref 12–28)
BUN: 12 mg/dL (ref 8–27)
CO2: 27 mmol/L (ref 20–29)
Calcium: 9.4 mg/dL (ref 8.7–10.3)
Chloride: 99 mmol/L (ref 96–106)
Creatinine, Ser: 1.07 mg/dL — ABNORMAL HIGH (ref 0.57–1.00)
GFR calc Af Amer: 64 mL/min/{1.73_m2} (ref >59)
GFR calc non Af Amer: 55 mL/min/{1.73_m2} — ABNORMAL LOW (ref >59)
Glucose: 131 mg/dL — ABNORMAL HIGH (ref 65–99)
Potassium: 4.4 mmol/L (ref 3.5–5.2)
Sodium: 139 mmol/L (ref 134–144)

## 2020-05-16 ENCOUNTER — Other Ambulatory Visit: Payer: Self-pay | Admitting: Family Medicine

## 2020-05-16 DIAGNOSIS — I10 Essential (primary) hypertension: Secondary | ICD-10-CM

## 2020-05-16 DIAGNOSIS — E785 Hyperlipidemia, unspecified: Secondary | ICD-10-CM

## 2020-05-20 ENCOUNTER — Other Ambulatory Visit: Payer: Self-pay

## 2020-05-20 DIAGNOSIS — Z1231 Encounter for screening mammogram for malignant neoplasm of breast: Secondary | ICD-10-CM

## 2020-05-20 NOTE — Addendum Note (Signed)
Addended by: Narda Rutherford on: 05/20/2020 10:59 AM   Modules accepted: Orders

## 2020-05-21 ENCOUNTER — Other Ambulatory Visit: Payer: Self-pay | Admitting: Family Medicine

## 2020-05-21 DIAGNOSIS — S62645A Nondisplaced fracture of proximal phalanx of left ring finger, initial encounter for closed fracture: Secondary | ICD-10-CM

## 2020-05-21 NOTE — Progress Notes (Signed)
    SUBJECTIVE:   CHIEF COMPLAINT / HPI:   HTN f/u Currently on metoprolol 50mg  bid and lisinopril 40mg  In the past, has been unable to take HCTZ due to gout and unable to take norvasc due to leg swelling Reports that she has been without lisinopril since 7/5  No CP, SOB, Leg Edema  PERTINENT  PMH / PSH: Hypertension, T2DM, hyperlipidemia  OBJECTIVE:   BP (!) 156/84   Pulse 63   Wt 182 lb 12.8 oz (82.9 kg)   SpO2 99%   BMI 31.38 kg/m    Physical Exam:  General: 64 y.o. female in NAD Cardio: RRR no m/r/g Lungs: CTAB, no wheezing, no rhonchi, no crackles, no IWOB on RA Skin: warm and dry Extremities: No edema   ASSESSMENT/PLAN:   HYPERTENSION, BENIGN SYSTEMIC Patient has been without lisinopril for 4 days.  Reports compliance with metoprolol 50 mg twice daily, pulse is 63 today.  Given that she has been without lisinopril, cannot make good judgments on changes in blood pressure regimen right now.  Ensured that lisinopril has been refilled by PCP.  Sent refill for metoprolol 50 mg twice daily.  Patient was advised that this was sent for 50 mg tabs now, therefore she would not need to take 2 tablets.  Also advised for her to take lisinopril at night, she has been taking this in the morning.  We will have her follow-up in 2 to 4 weeks with her PCP.  Diabetes mellitus (HCC) A1c today is 7.3.  Follow-up with PCP.     9/5, DO [facility]

## 2020-05-22 ENCOUNTER — Encounter: Payer: Self-pay | Admitting: Family Medicine

## 2020-05-22 ENCOUNTER — Ambulatory Visit (INDEPENDENT_AMBULATORY_CARE_PROVIDER_SITE_OTHER): Payer: Self-pay | Admitting: Family Medicine

## 2020-05-22 ENCOUNTER — Other Ambulatory Visit: Payer: Self-pay

## 2020-05-22 VITALS — BP 156/84 | HR 63 | Wt 182.8 lb

## 2020-05-22 DIAGNOSIS — E119 Type 2 diabetes mellitus without complications: Secondary | ICD-10-CM

## 2020-05-22 DIAGNOSIS — I10 Essential (primary) hypertension: Secondary | ICD-10-CM

## 2020-05-22 DIAGNOSIS — Z794 Long term (current) use of insulin: Secondary | ICD-10-CM

## 2020-05-22 LAB — POCT GLYCOSYLATED HEMOGLOBIN (HGB A1C): HbA1c, POC (controlled diabetic range): 7.3 % — AB (ref 0.0–7.0)

## 2020-05-22 MED ORDER — METOPROLOL TARTRATE 50 MG PO TABS: 50.0000 mg | ORAL_TABLET | Freq: Two times a day (BID) | ORAL | 1 refills | Status: DC

## 2020-05-22 MED FILL — traMADol HCL 50 MG TABS: 50 | 7 days supply | Qty: 21 | Fill #0

## 2020-05-22 NOTE — Assessment & Plan Note (Signed)
A1c today is 7.3.  Follow-up with PCP.

## 2020-05-22 NOTE — Patient Instructions (Signed)
Thank you for coming to see me today. It was a pleasure. Today we talked about:   Start taking your lisinopril at night.  Your metoprolol will only be one pill twice a day now because it is a higher dose pill.  Please follow-up with Dr. Constance Goltz in 2-4 weeks.  If you have any questions or concerns, please do not hesitate to call the office at [phone].  Best,   Luis Abed, DO

## 2020-05-22 NOTE — Assessment & Plan Note (Signed)
Patient has been without lisinopril for 4 days.  Reports compliance with metoprolol 50 mg twice daily, pulse is 63 today.  Given that she has been without lisinopril, cannot make good judgments on changes in blood pressure regimen right now.  Ensured that lisinopril has been refilled by PCP.  Sent refill for metoprolol 50 mg twice daily.  Patient was advised that this was sent for 50 mg tabs now, therefore she would not need to take 2 tablets.  Also advised for her to take lisinopril at night, she has been taking this in the morning.  We will have her follow-up in 2 to 4 weeks with her PCP.

## 2020-05-26 ENCOUNTER — Other Ambulatory Visit: Payer: Self-pay | Admitting: Family Medicine

## 2020-05-28 ENCOUNTER — Ambulatory Visit
Admission: RE | Admit: 2020-05-28 | Discharge: 2020-05-28 | Disposition: A | Discharge status: Home / Self Care | Payer: No Typology Code available for payment source | Source: Ambulatory Visit | Attending: Family Medicine | Admitting: Family Medicine

## 2020-05-28 ENCOUNTER — Other Ambulatory Visit: Payer: Self-pay

## 2020-05-28 DIAGNOSIS — Z1231 Encounter for screening mammogram for malignant neoplasm of breast: Secondary | ICD-10-CM

## 2020-06-01 ENCOUNTER — Other Ambulatory Visit: Payer: Self-pay | Admitting: Family Medicine

## 2020-06-01 DIAGNOSIS — G4700 Insomnia, unspecified: Secondary | ICD-10-CM

## 2020-06-01 MED FILL — ZOLPIDEM TARTRATE 10 MG TAB: 10 | 30 days supply | Qty: 30 | Fill #0

## 2020-06-12 ENCOUNTER — Other Ambulatory Visit: Payer: Self-pay | Admitting: Family Medicine

## 2020-06-12 DIAGNOSIS — S62645A Nondisplaced fracture of proximal phalanx of left ring finger, initial encounter for closed fracture: Secondary | ICD-10-CM

## 2020-06-12 MED FILL — traMADol HCL 50 MG TABS: 50 | 9 days supply | Qty: 28 | Fill #0

## 2020-06-30 ENCOUNTER — Ambulatory Visit: Payer: No Typology Code available for payment source | Admitting: Family Medicine

## 2020-06-30 NOTE — Progress Notes (Deleted)
    SUBJECTIVE:   CHIEF COMPLAINT / HPI:   HTN; lisinopril and metoprolol  DM: lantus and novolog. Metformin.  Foot exam, ,ophtho.   Chronic pain:   Run med list:   PERTINENT  PMH / PSH: ***  OBJECTIVE:   There were no vitals taken for this visit.  ***  ASSESSMENT/PLAN:   No problem-specific Assessment & Plan notes found for this encounter.     Sandre Kitty, MD [facility]

## 2020-07-02 ENCOUNTER — Other Ambulatory Visit: Payer: Self-pay | Admitting: Family Medicine

## 2020-07-02 DIAGNOSIS — S62645A Nondisplaced fracture of proximal phalanx of left ring finger, initial encounter for closed fracture: Secondary | ICD-10-CM

## 2020-07-02 DIAGNOSIS — G4700 Insomnia, unspecified: Secondary | ICD-10-CM

## 2020-07-03 ENCOUNTER — Other Ambulatory Visit: Payer: Self-pay | Admitting: Family Medicine

## 2020-07-03 MED FILL — traMADol HCL 50 MG TABS: 50 | 10 days supply | Qty: 30 | Fill #0

## 2020-07-03 MED FILL — ZOLPIDEM TARTRATE 10 MG TAB: 10 | 30 days supply | Qty: 30 | Fill #0

## 2020-07-08 NOTE — Progress Notes (Signed)
    SUBJECTIVE:   CHIEF COMPLAINT / HPI:   DM: Patient has been taking her Metformin twice a day as well as 7 units of Lantus at night and 3 units of NovoLog with meals 3 times a day.  She has not had any difficulty with compliance.  Does not endorse any side effects.  Does not endorse any hypoglycemic episodes.  Has not been to an ophthalmologist recently and she states that they told her they need a referral from [place] to get the diabetic retinopathy exam.  She goes to [place] [facility].   HTN: Patient takes lisinopril and metoprolol.  She takes it on most days.  She experienced side effects to both amlodipine and hydrochlorothiazide.  PERTINENT  PMH / PSH: Diabetes and hypertension  OBJECTIVE:   BP (!) 150/90   Pulse 72   Ht 5\' 4"  (1.626 m)   Wt 184 lb 12.8 oz (83.8 kg)   SpO2 96%   BMI 31.72 kg/m   General: Alert and oriented.  No acute distress. CV: Regular rate and rhythm, no murmurs. Pulmonary: Lungs clear to auscultation bilaterally, no wheezes or crackles. GI: Soft and nontender. Extremities: Normal foot sensation bilaterally with normal DP pulses bilaterally.  ASSESSMENT/PLAN:   Diabetes mellitus (HCC) Patient had A1c of 7.3 a month ago.  Did not check A1c today.  Trend and A1c's have been continually downtrending.  Encourage patient to continue her current regimen.  We will follow up again in 3 months when we check her blood pressure.  Normal foot exam.  We will send in ophthalmology referral to eye care  HYPERTENSION, BENIGN SYSTEMIC Initial blood pressure was above 150 systolic.  Recheck was 150/90.  Patient is currently on max dose of her lisinopril and taking 25 mg of metoprolol twice daily.  Recent heart rate was in the low 60s.  Today was 72.  Given her goal at her age of less than 150/90 and just meeting that goal on the second reading, I will defer any changes in blood pressure management at this time and follow-up again in 3 months.  Neck step would be to increase  metoprolol, but was concerned today about heart rate.  Other option is to initiate spironolactone.     [place], MD [facility]

## 2020-07-09 ENCOUNTER — Other Ambulatory Visit: Payer: Self-pay

## 2020-07-09 ENCOUNTER — Encounter: Payer: Self-pay | Admitting: Family Medicine

## 2020-07-09 ENCOUNTER — Ambulatory Visit (INDEPENDENT_AMBULATORY_CARE_PROVIDER_SITE_OTHER): Payer: Self-pay | Admitting: Family Medicine

## 2020-07-09 VITALS — BP 150/90 | HR 72 | Ht 64.0 in | Wt 184.8 lb

## 2020-07-09 DIAGNOSIS — E119 Type 2 diabetes mellitus without complications: Secondary | ICD-10-CM

## 2020-07-09 DIAGNOSIS — I10 Essential (primary) hypertension: Secondary | ICD-10-CM

## 2020-07-09 DIAGNOSIS — Z794 Long term (current) use of insulin: Secondary | ICD-10-CM

## 2020-07-09 MED ORDER — LISINOPRIL 40 MG PO TABS: 40.0000 mg | ORAL_TABLET | Freq: Every day | ORAL | 1 refills | Status: DC

## 2020-07-09 NOTE — Patient Instructions (Signed)
It was nice to meet you today per  On recheck your blood pressure was 150/90 which is right at the borderline of is acceptable.  So we will continue to watch it for now.  Continue taking your diabetic medications as prescribed.  Your blood sugar continues to improve.  I will send in a referral to [place] [facility] for the diabetic retinopathy screening.  I would like to see you back in 3 months to recheck your blood pressure and diabetes.  Have a great day,  Frederic Jericho, MD

## 2020-07-10 NOTE — Assessment & Plan Note (Addendum)
Patient had A1c of 7.3 a month ago.  Did not check A1c today.  Trend and A1c's have been continually downtrending.  Encourage patient to continue her current regimen.  We will follow up again in 3 months when we check her blood pressure.  Normal foot exam.  We will send in ophthalmology referral to [place] [facility]

## 2020-07-10 NOTE — Assessment & Plan Note (Signed)
Initial blood pressure was above 150 systolic.  Recheck was 150/90.  Patient is currently on max dose of her lisinopril and taking 25 mg of metoprolol twice daily.  Recent heart rate was in the low 60s.  Today was 72.  Given her goal at her age of less than 150/90 and just meeting that goal on the second reading, I will defer any changes in blood pressure management at this time and follow-up again in 3 months.  Neck step would be to increase metoprolol, but was concerned today about heart rate.  Other option is to initiate spironolactone.

## 2020-07-21 ENCOUNTER — Other Ambulatory Visit: Payer: Self-pay | Admitting: *Deleted

## 2020-07-21 DIAGNOSIS — I10 Essential (primary) hypertension: Secondary | ICD-10-CM

## 2020-07-22 MED ORDER — METOPROLOL TARTRATE 50 MG PO TABS: 50.0000 mg | ORAL_TABLET | Freq: Two times a day (BID) | ORAL | 1 refills | Status: DC

## 2020-07-29 ENCOUNTER — Other Ambulatory Visit: Payer: Self-pay | Admitting: Family Medicine

## 2020-07-29 DIAGNOSIS — S62645A Nondisplaced fracture of proximal phalanx of left ring finger, initial encounter for closed fracture: Secondary | ICD-10-CM

## 2020-07-29 DIAGNOSIS — G4700 Insomnia, unspecified: Secondary | ICD-10-CM

## 2020-07-31 MED FILL — ZOLPIDEM TARTRATE 10 MG TAB: 10 | 30 days supply | Qty: 30 | Fill #0

## 2020-07-31 MED FILL — traMADol HCL 50 MG TABS: 50 | 10 days supply | Qty: 30 | Fill #0

## 2020-08-28 ENCOUNTER — Other Ambulatory Visit: Payer: Self-pay | Admitting: Family Medicine

## 2020-08-28 DIAGNOSIS — G4700 Insomnia, unspecified: Secondary | ICD-10-CM

## 2020-08-28 DIAGNOSIS — S62645A Nondisplaced fracture of proximal phalanx of left ring finger, initial encounter for closed fracture: Secondary | ICD-10-CM

## 2020-08-28 MED FILL — ZOLPIDEM TARTRATE 10 MG TAB: 10 | 30 days supply | Qty: 30 | Fill #0

## 2020-08-28 MED FILL — traMADol HCL 50 MG TABS: 50 | 10 days supply | Qty: 30 | Fill #0

## 2020-08-30 NOTE — Progress Notes (Deleted)
    SUBJECTIVE:   CHIEF COMPLAINT / HPI:   DM: foot exam.    HTN:   Vaginal burning: was on premarin.    PERTINENT  PMH / PSH: ***  OBJECTIVE:   There were no vitals taken for this visit.  ***  ASSESSMENT/PLAN:   No problem-specific Assessment & Plan notes found for this encounter.     Sandre Kitty, MD [facility]

## 2020-08-31 ENCOUNTER — Ambulatory Visit: Payer: No Typology Code available for payment source | Admitting: Family Medicine

## 2020-09-02 ENCOUNTER — Telehealth: Payer: Self-pay

## 2020-09-02 NOTE — Telephone Encounter (Signed)
Patient calls nurse line regarding issues with mirtazapine 15 mg. Patient states that she has been using this medication to increase her appetite. Patient reports that she has continued to take medication, per office visit note, this medication was d/c'd on 07/09/2020.   To PCP  Please advise if patient is stills supposed to be taking medication.   Veronda Prude, RN

## 2020-09-02 NOTE — Telephone Encounter (Signed)
Is she having issues with the medication or issues getting the prescription for this medication filled?  It looks like she stated she wasn't taking the medication when we did the med rec at our visit on 8/26, so that is probably why it was discontinued.

## 2020-09-03 NOTE — Telephone Encounter (Signed)
Patient reports that with current dosage she is not having the desired effect of increased appetite.   Patient is asking if dosage needs to be increased or if another medication would be more effective.   Veronda Prude, RN

## 2020-09-04 NOTE — Telephone Encounter (Signed)
She can try increasing to 15mg  per day but this medication works best for appetite at 7.5-15mg  dosage. If she is still not seeing benefit after increasing to 15mg  she can stop the medication.

## 2020-09-23 NOTE — Progress Notes (Signed)
    SUBJECTIVE:   CHIEF COMPLAINT / HPI:   MV:HQIONGE medications include: metformin bid, lantus 7 U, 3 U novolog TID. Marland Kitchen  She 'never got a call' for her ophthalmology appt. She does not have the orange card currently and therefore would not be able to go to the ophthalmologist  at this time.      Decreased appetite:  Pt states Sometimes her stomach 'feels funny'.  No n/v.  No changes in BM other than increased frequency to 'every other morning'. Yesterday she had to eat: a bagel w/ cream cheese for breakfast; At lunch she had a pear;   For dinner she had macaroni salad and crackers. Drank water only.  No snacks in between.      HTN: pt is taking her lisionpril and metoprolol every day.  No side effects.   PERTINENT  PMH / PSH: DM  OBJECTIVE:   BP (!) 150/100   Pulse 78   Ht 5\' 4"  (1.626 m)   Wt 179 lb (81.2 kg)   SpO2 99%   BMI 30.73 kg/m   Gen: alert. Oriented. No acute distress.  CV: RRR Pulm: LCTAB  ASSESSMENT/PLAN:   Diabetes mellitus (HCC) - increase Lantus by 1 U each day for every fasting blood glucose over 140.  Decrease by 1 U for fasting blood glucose below 100.    HYPERTENSION, BENIGN SYSTEMIC - Increasing metoprolol tartrate to 100mg  BID - f/u in 1 month  Decreased appetite Pt's 24 hr recall appears adequate.  No weight loss since last visit.  Discussed age related appetite and metabolism changes with pt.  Will continue to monitor weight at subsequent visits.      , MD [facility]

## 2020-09-24 ENCOUNTER — Ambulatory Visit (INDEPENDENT_AMBULATORY_CARE_PROVIDER_SITE_OTHER): Payer: Self-pay | Admitting: Family Medicine

## 2020-09-24 ENCOUNTER — Other Ambulatory Visit: Payer: Self-pay

## 2020-09-24 ENCOUNTER — Encounter: Payer: Self-pay | Admitting: Family Medicine

## 2020-09-24 VITALS — BP 150/100 | HR 78 | Ht 64.0 in | Wt 179.0 lb

## 2020-09-24 DIAGNOSIS — R630 Anorexia: Secondary | ICD-10-CM

## 2020-09-24 DIAGNOSIS — E119 Type 2 diabetes mellitus without complications: Secondary | ICD-10-CM

## 2020-09-24 DIAGNOSIS — Z794 Long term (current) use of insulin: Secondary | ICD-10-CM

## 2020-09-24 DIAGNOSIS — Z23 Encounter for immunization: Secondary | ICD-10-CM

## 2020-09-24 DIAGNOSIS — I10 Essential (primary) hypertension: Secondary | ICD-10-CM

## 2020-09-24 LAB — POCT GLYCOSYLATED HEMOGLOBIN (HGB A1C): HbA1c, POC (controlled diabetic range): 8.2 % — AB (ref 0.0–7.0)

## 2020-09-24 MED ORDER — METOPROLOL TARTRATE 100 MG PO TABS: 100.0000 mg | ORAL_TABLET | Freq: Two times a day (BID) | ORAL | 1 refills | Status: DC

## 2020-09-24 NOTE — Patient Instructions (Signed)
It was nice to see you again today,  For your diabetes I would like you to titrate up your Lantus dose based on the following guidelines: -In the morning take a fasting blood sugar (this means you have not eaten anything in the past 6 hours).  If this blood sugar is over 140, increase your Lantus by 1 unit. -Do this every day with the goal being between 100 and 140. -If your blood sugar in the morning fasting ever goes below 100 decrease your Lantus by 1 unit for that day.  Please cut out the midnight snacks and sugary foods and candies to help with your blood sugar.  For your blood pressure, I have increased your metoprolol to 100 mg twice a day.  I have written you prescription for this.  Please do not take your old metoprolol medication while taking this medication.  I would like to follow-up in 1 month for your blood pressure.  Have a great day,  Frederic Jericho, MD

## 2020-09-24 NOTE — Assessment & Plan Note (Signed)
-   increase Lantus by 1 U each day for every fasting blood glucose over 140.  Decrease by 1 U for fasting blood glucose below 100.

## 2020-09-24 NOTE — Assessment & Plan Note (Signed)
-   Increasing metoprolol tartrate to 100mg  BID - f/u in 1 month

## 2020-09-24 NOTE — Assessment & Plan Note (Signed)
Pt's 24 hr recall appears adequate.  No weight loss since last visit.  Discussed age related appetite and metabolism changes with pt.  Will continue to monitor weight at subsequent visits.

## 2020-09-27 ENCOUNTER — Ambulatory Visit: Payer: No Typology Code available for payment source | Attending: Internal Medicine

## 2020-09-27 DIAGNOSIS — Z23 Encounter for immunization: Secondary | ICD-10-CM

## 2020-09-27 NOTE — Progress Notes (Signed)
   Covid-19 Vaccination Clinic  Name:  [name]    MRN: [id number] DOB: 1956/01/17  09/27/2020  Ms. Stream was observed post Covid-19 immunization for 15 minutes without incident. She was provided with Vaccine Information Sheet and instruction to access the V-Safe system.   [name] was instructed to call 911 with any severe reactions post vaccine: Marland Kitchen Difficulty breathing  . Swelling of face and throat  . A fast heartbeat  . A bad rash all over body  . Dizziness and weakness   Immunizations Administered    Name Date Dose VIS Date Route   Pfizer COVID-19 Vaccine 09/27/2020 10:58 AM 0.3 mL 09/02/2020 Intramuscular   Manufacturer: [employer], [employer]   Lot: [id number]   NDC: [address]-7

## 2020-09-28 ENCOUNTER — Other Ambulatory Visit: Payer: Self-pay | Admitting: Family Medicine

## 2020-09-28 DIAGNOSIS — G4700 Insomnia, unspecified: Secondary | ICD-10-CM

## 2020-09-28 DIAGNOSIS — S62645A Nondisplaced fracture of proximal phalanx of left ring finger, initial encounter for closed fracture: Secondary | ICD-10-CM

## 2020-09-29 ENCOUNTER — Other Ambulatory Visit: Payer: Self-pay | Admitting: Family Medicine

## 2020-09-29 MED FILL — ZOLPIDEM TARTRATE 10 MG TAB: 10 | 30 days supply | Qty: 30 | Fill #0

## 2020-09-29 MED FILL — traMADol HCL 50 MG TABS: 50 | 10 days supply | Qty: 30 | Fill #0

## 2020-10-16 ENCOUNTER — Other Ambulatory Visit: Payer: Self-pay | Admitting: *Deleted

## 2020-10-16 DIAGNOSIS — Z794 Long term (current) use of insulin: Secondary | ICD-10-CM

## 2020-10-16 MED ORDER — METFORMIN HCL 1000 MG PO TABS: ORAL_TABLET | ORAL | 4 refills | Status: DC

## 2020-10-28 ENCOUNTER — Other Ambulatory Visit: Payer: Self-pay | Admitting: Family Medicine

## 2020-10-28 DIAGNOSIS — G4700 Insomnia, unspecified: Secondary | ICD-10-CM

## 2020-10-28 DIAGNOSIS — S62645A Nondisplaced fracture of proximal phalanx of left ring finger, initial encounter for closed fracture: Secondary | ICD-10-CM

## 2020-10-28 NOTE — Progress Notes (Signed)
    SUBJECTIVE:   CHIEF COMPLAINT / HPI:   DM: Patient states that she has been increasing her Lantus by 1 unit on days where her fasting blood sugar is more than 140.  The next day she goes back down to 7 units events less than 140.  She has not had any where they are less than 100.  Recently have they been in the 120s to 130s.  HTN: Patient is still compliant with her lisinopril and metoprolol.  Not missing any days.  She is open to starting a new medication.  She has never tried spironolactone.    Decreased appetite: Patient still complains of decreased appetite similar to her last visits.  Still eating about 2 meals a day.  Denies abdominal pain, denies nausea or vomiting.  Foot pain: Patient states the lesion on her foot has become more painful.  She is not interested in trying cryotherapy, but is interested in over-the-counter wart removal products.  PERTINENT  PMH / PSH: DM, HTN  OBJECTIVE:   BP (!) 156/78   Pulse 81   Ht 5\' 4"  (1.626 m)   Wt 181 lb 9.6 oz (82.4 kg)   SpO2 97%   BMI 31.17 kg/m   General: Alert and oriented.  No acute distress CV: Regular rate and rhythm Foot: Small circular wartlike lesion seen on the base of the right fifth metatarsal region.     ASSESSMENT/PLAN:   Diabetes mellitus (HCC) Clarified with patient today that I would like her to leave the dose of Lantus at the higher level after she increases it and not reset back to 7 units every day.  I suspect her Lantus need to level out around 8 or 9 units.  Patient clarifies her understanding of this.  Will reevaluate at next visit.  HYPERTENSION, BENIGN SYSTEMIC Still elevated after titration of metoprolol.  Will add spironolactone 25 mg today.  We will check kidney function and potassium today and then again in 1 week.    Plantar wart Patient declines cryotherapy today.  Showed patient were to get over-the-counter wart removal treatments.  Also advised her she can use duct tape for occlusive  therapy over the wart.  Patient can receive cryotherapy in the future if she changes her mind.  Decreased appetite Weight stable since last visit.  Without complaints of difficulty swallowing, abdominal pain, nausea or vomiting is difficult to say if this is anything other than age-related decrease in appetite.  We will continue to monitor for weight loss at subsequent visits.     , MD [facility]

## 2020-10-29 ENCOUNTER — Other Ambulatory Visit: Payer: Self-pay | Admitting: Family Medicine

## 2020-10-29 ENCOUNTER — Other Ambulatory Visit: Payer: Self-pay

## 2020-10-29 ENCOUNTER — Ambulatory Visit (INDEPENDENT_AMBULATORY_CARE_PROVIDER_SITE_OTHER): Payer: Self-pay | Admitting: Family Medicine

## 2020-10-29 ENCOUNTER — Encounter: Payer: Self-pay | Admitting: Family Medicine

## 2020-10-29 VITALS — BP 156/78 | HR 81 | Ht 64.0 in | Wt 181.6 lb

## 2020-10-29 DIAGNOSIS — I10 Essential (primary) hypertension: Secondary | ICD-10-CM

## 2020-10-29 DIAGNOSIS — R630 Anorexia: Secondary | ICD-10-CM

## 2020-10-29 DIAGNOSIS — B070 Plantar wart: Secondary | ICD-10-CM

## 2020-10-29 DIAGNOSIS — E119 Type 2 diabetes mellitus without complications: Secondary | ICD-10-CM

## 2020-10-29 DIAGNOSIS — Z794 Long term (current) use of insulin: Secondary | ICD-10-CM

## 2020-10-29 MED ORDER — SPIRONOLACTONE 25 MG PO TABS: 25.0000 mg | ORAL_TABLET | Freq: Every day | ORAL | 3 refills | Status: DC

## 2020-10-29 NOTE — Assessment & Plan Note (Signed)
Still elevated after titration of metoprolol.  Will add spironolactone 25 mg today.  We will check kidney function and potassium today and then again in 1 week.

## 2020-10-29 NOTE — Patient Instructions (Addendum)
It was nice to see you again today,  For your diabetes, I want to do the following: -When you check your blood sugar in the morning and it is above 140 I want you to increase your Lantus by 1 unit.  I want you to keep your Lantus at this same dosage for the following days as well.  I do not want you to go backwards with the Lantus dosage until you get some early morning readings that are less than 100.  When you get a reading that is less than 100 you can decrease it back 1 unit.  For your blood pressure I have added a new medication called spironolactone.  We will get a blood test today and then follow-up with another blood test again in 1 week.  For your plantar wart, you can get a treatment over-the-counter at the drugstore.  I have included a picture of a below.  You can also put duct tape over the wart and leave it there as well.  Both methods are equally effective and you can try both.   have a great day,  Frederic Jericho, MD

## 2020-10-29 NOTE — Assessment & Plan Note (Signed)
Weight stable since last visit.  Without complaints of difficulty swallowing, abdominal pain, nausea or vomiting is difficult to say if this is anything other than age-related decrease in appetite.  We will continue to monitor for weight loss at subsequent visits.

## 2020-10-29 NOTE — Assessment & Plan Note (Signed)
Clarified with patient today that I would like her to leave the dose of Lantus at the higher level after she increases it and not reset back to 7 units every day.  I suspect her Lantus need to level out around 8 or 9 units.  Patient clarifies her understanding of this.  Will reevaluate at next visit.

## 2020-10-29 NOTE — Assessment & Plan Note (Signed)
Patient declines cryotherapy today.  Showed patient were to get over-the-counter wart removal treatments.  Also advised her she can use duct tape for occlusive therapy over the wart.  Patient can receive cryotherapy in the future if she changes her mind.

## 2020-10-30 LAB — COMPREHENSIVE METABOLIC PANEL
ALT: 6 IU/L (ref 0–32)
AST: 10 IU/L (ref 0–40)
Albumin/Globulin Ratio: 1.6 (ref 1.2–2.2)
Albumin: 4.1 g/dL (ref 3.8–4.8)
Alkaline Phosphatase: 111 IU/L (ref 44–121)
BUN/Creatinine Ratio: 16 (ref 12–28)
BUN: 16 mg/dL (ref 8–27)
Bilirubin Total: 0.3 mg/dL (ref 0.0–1.2)
CO2: 25 mmol/L (ref 20–29)
Calcium: 9.6 mg/dL (ref 8.7–10.3)
Chloride: 99 mmol/L (ref 96–106)
Creatinine, Ser: 1.01 mg/dL — ABNORMAL HIGH (ref 0.57–1.00)
GFR calc Af Amer: 68 mL/min/{1.73_m2} (ref >59)
GFR calc non Af Amer: 59 mL/min/{1.73_m2} — ABNORMAL LOW (ref >59)
Globulin, Total: 2.6 g/dL (ref 1.5–4.5)
Glucose: 183 mg/dL — ABNORMAL HIGH (ref 65–99)
Potassium: 4.5 mmol/L (ref 3.5–5.2)
Sodium: 139 mmol/L (ref 134–144)
Total Protein: 6.7 g/dL (ref 6.0–8.5)

## 2020-10-30 MED FILL — traMADol HCL 50 MG TABS: 50 | 10 days supply | Qty: 30 | Fill #0

## 2020-10-30 MED FILL — ZOLPIDEM TARTRATE 10 MG TAB: 10 | 30 days supply | Qty: 30 | Fill #0

## 2020-11-03 ENCOUNTER — Other Ambulatory Visit: Payer: No Typology Code available for payment source

## 2020-11-19 ENCOUNTER — Other Ambulatory Visit: Payer: Self-pay

## 2020-11-19 ENCOUNTER — Other Ambulatory Visit: Payer: No Typology Code available for payment source

## 2020-11-19 DIAGNOSIS — I10 Essential (primary) hypertension: Secondary | ICD-10-CM

## 2020-11-20 LAB — BASIC METABOLIC PANEL
BUN/Creatinine Ratio: 9 — ABNORMAL LOW (ref 12–28)
BUN: 10 mg/dL (ref 8–27)
CO2: 23 mmol/L (ref 20–29)
Calcium: 9.6 mg/dL (ref 8.7–10.3)
Chloride: 101 mmol/L (ref 96–106)
Creatinine, Ser: 1.15 mg/dL — ABNORMAL HIGH (ref 0.57–1.00)
GFR calc Af Amer: 59 mL/min/{1.73_m2} — ABNORMAL LOW (ref >59)
GFR calc non Af Amer: 51 mL/min/{1.73_m2} — ABNORMAL LOW (ref >59)
Glucose: 267 mg/dL — ABNORMAL HIGH (ref 65–99)
Potassium: 4.5 mmol/L (ref 3.5–5.2)
Sodium: 141 mmol/L (ref 134–144)

## 2020-11-23 ENCOUNTER — Other Ambulatory Visit: Payer: Self-pay | Admitting: Family Medicine

## 2020-11-27 ENCOUNTER — Other Ambulatory Visit: Payer: Self-pay | Admitting: Family Medicine

## 2020-11-27 DIAGNOSIS — G4700 Insomnia, unspecified: Secondary | ICD-10-CM

## 2020-11-27 DIAGNOSIS — S62645A Nondisplaced fracture of proximal phalanx of left ring finger, initial encounter for closed fracture: Secondary | ICD-10-CM

## 2020-11-28 ENCOUNTER — Other Ambulatory Visit: Payer: Self-pay | Admitting: Family Medicine

## 2020-11-28 MED FILL — ZOLPIDEM TARTRATE 10 MG TAB: 10 | 30 days supply | Qty: 30 | Fill #0

## 2020-11-28 MED FILL — traMADol HCL 50 MG TABS: 50 | 10 days supply | Qty: 30 | Fill #0

## 2020-12-23 ENCOUNTER — Other Ambulatory Visit: Payer: Self-pay | Admitting: Family Medicine

## 2020-12-23 DIAGNOSIS — I10 Essential (primary) hypertension: Secondary | ICD-10-CM

## 2020-12-25 ENCOUNTER — Other Ambulatory Visit: Payer: Self-pay | Admitting: Family Medicine

## 2020-12-25 DIAGNOSIS — G4700 Insomnia, unspecified: Secondary | ICD-10-CM

## 2020-12-25 DIAGNOSIS — S62645A Nondisplaced fracture of proximal phalanx of left ring finger, initial encounter for closed fracture: Secondary | ICD-10-CM

## 2020-12-26 ENCOUNTER — Other Ambulatory Visit: Payer: Self-pay | Admitting: Family Medicine

## 2020-12-26 MED FILL — ZOLPIDEM TARTRATE 10 MG TAB: 10 | 30 days supply | Qty: 30 | Fill #0

## 2020-12-26 MED FILL — traMADol HCL 50 MG TABS: 50 | 10 days supply | Qty: 30 | Fill #0

## 2020-12-29 ENCOUNTER — Telehealth: Payer: Self-pay

## 2020-12-29 ENCOUNTER — Other Ambulatory Visit: Payer: Self-pay

## 2020-12-29 ENCOUNTER — Ambulatory Visit (INDEPENDENT_AMBULATORY_CARE_PROVIDER_SITE_OTHER): Payer: Self-pay | Admitting: Family Medicine

## 2020-12-29 VITALS — BP 140/70 | HR 64 | Ht 64.0 in | Wt 173.2 lb

## 2020-12-29 DIAGNOSIS — G4709 Other insomnia: Secondary | ICD-10-CM

## 2020-12-29 DIAGNOSIS — I10 Essential (primary) hypertension: Secondary | ICD-10-CM

## 2020-12-29 DIAGNOSIS — R1114 Bilious vomiting: Secondary | ICD-10-CM

## 2020-12-29 DIAGNOSIS — R630 Anorexia: Secondary | ICD-10-CM

## 2020-12-29 MED ORDER — LISINOPRIL 40 MG PO TABS
40.0000 mg | ORAL_TABLET | Freq: Every day | ORAL | 1 refills | Status: DC
Start: 2020-12-29 — End: 2021-06-16

## 2020-12-29 MED ORDER — SPIRONOLACTONE 25 MG PO TABS: 25.0000 mg | ORAL_TABLET | Freq: Every day | ORAL | 3 refills | Status: DC

## 2020-12-29 NOTE — Patient Instructions (Addendum)
It was a pleasure to see you today!  1. We will get some labs today.  If they are abnormal or we need to do something about them, I will call you.  If they are normal, I will send you a message on MyChart (if it is active) or a letter in the mail.  If you don't hear from [place] in 2 weeks, please call the office  [phone].  2. For blood pressure: I refilled spironolactone 25 mg to take every night. Please get the blood pressure cuff and check your blood pressure at the same time every day for a week and bring those numbers to your follow up visit with Dr. Constance Goltz on March 2nd. If you have BP >180/110 and any vision changes, headache, trouble moving or speaking, please go to the nearest emergency department.  3. Sleep hygiene: I recommend not watching screens within 1 hour of bed time, try finding a routine that is relaxing (reading a book, tea, aromatherapy, warm bath). You can use melatonin to help stay asleep, I recommend taking a break every 4-6 weeks.  Be Well,  Dr. Leary Roca

## 2020-12-29 NOTE — Telephone Encounter (Signed)
Patient calls nurse line regarding side effects from Ambien. Patient reports that she took pill last night and began "feeling high" and hallucinating. Reports that she was "seeing and hearing things, seeing kids in her bedroom". Patient reports calling EMS to home last night.   EMS recommended hospital evaluation due to elevated BP. Patient is unable to report how high BP was, but said it was around 190 systolic. Patient declined hospital evaluation and decided to follow up in our office.   Currently patient reports that she is feeling much better, with only symptoms being a mild headache.   Scheduled patient same day ATC appointment for further evaluation. Patient also scheduled with PCP on 3/3 for further medication follow up. (Patient continues to report concerns with decreased appetite).   Strict ED precautions given until appointment this afternoon.    Veronda Prude, RN

## 2020-12-29 NOTE — Progress Notes (Signed)
SUBJECTIVE:   CHIEF COMPLAINT / HPI:   HTN: Patient had EMS come to come last night due to hallucinations from [place] and was noted to have severely elevated BP to 190 systolic. Patient is currently on lisinopril 40 mg, metoprolol tartrate 100 mg BID, and spironolactone 25 mg. She reports that she recently ran out of spironolactone several days ago and called pharmacy to request refill, but was told it was too soon to request a refill. Patient had 30 day supply and took 1 med per day, not too soon for refill. She denies headache, vision changes, movement or speech problems.  Insomnia: patient reports that she has used [place] for insomnia for a while and has not had problems with it until two days ago when she had a bad hallucination with paranoid features. She reports that she goes to her room around 7 PM, gets in bed and watches TV for 2 hours, then turns off TV and tries to go to sleep, which usually takes about an hour. She then may wake up 1-3 times throughout the night and can sometimes take 20-30 minutes to fall back to sleep. She then wakes up around 9 AM. She reports that she has tried melatonin to try to fall asleep without success.   Decreased appetite and Bilious emesis: Patient reports several months of decreased appetite, for which she is using mirtazipine prescribed to her from 2019. She is concerned that she is not having an increase of appetite while using mirtazipine and would like a refill. Patient also reports that she has had 3 episodes of emesis in the last 2 weeks, all occasions were during this weekend and the weekend before while cooking. Patient suddenly became nauseated while cooking, had to go to the bathroom, where she had "yellow fluid" with vomiting. She denies fevers, chest pain, SOB, abdominal pain, diarrhea, constipation. She felt better shortly after vomiting on each occasion. She has been able to eat and drink normally without problem since then. No sick  contacts.  PERTINENT  PMH / PSH: DMT2, HTN, h/o TIA, HLD, insomnia  OBJECTIVE:   BP 140/70   Pulse 64   Ht 5\' 4"  (1.626 m)   Wt 173 lb 3.2 oz (78.6 kg)   SpO2 94%   BMI 29.73 kg/m   Nursing note and vitals reviewed GEN: age-appropriate AAW resting comfortably in chair, NAD, WNWD HEENT: NCAT. Sclera without injection or icterus. Cardiac: Regular rate and rhythm. Normal S1/S2. No murmurs, rubs, or gallops appreciated. 2+ radial pulses. Lungs: Clear bilaterally to ascultation. No increased WOB, no accessory muscle usage. No w/r/r. Abdomen: Normoactive bowel sounds. Soft. No tenderness to deep or light palpation. No rebound or guarding.    Neuro: Alert and at baseline Ext: no edema Psych: Pleasant and appropriate   ASSESSMENT/PLAN:   HYPERTENSION, BENIGN SYSTEMIC Patient with elevation of BP after running out of spironolactone 25 mg. Also on lisinopril 40 mg, metoprolol tartrate 100 mg BID. Will refill spironolactone 25 mg. Patient has BP cuff at home. Recommend she take her BP at the same time every day for a week and document BP for accurate data. She has follow up with PCP, Dr. , in 2 weeks, can review data at that time. Can consider referral to ambulatory cuff clinic with pharmacy team if no improvement. Return precautions for hypertensive urgency given. - CMP obtained today to check scr and K  Insomnia Patient with paranoid hallucinations with Constance Goltz, with age also increases risk of falls. Recommend d/c  ambien. Reviewed sleep hygiene with patient, she will change TV watching, attempt melatonin to stay asleep (with 6 week drug holidays), will try reading and hot tea for bed time routine. Follow up if no improvement.   Decreased appetite Patient reports decreased appetite and vomiting x3, however weight is stable at 170+ lbs and patient has BMI of 29 and DMT2 that is borderline uncontrolled. She had colonoscopy in 2020 that was normal, EGDs in 2005 and 2008 that were also normal.  She has mirtazepine from 2019 that she takes on occasion for her appetite. As she has no weight loss, is obese, and has DMT2, this is not appropriate therapy. Patient with only 3 episodes of emesis, recommend she keep symptom journal with what she ate/did if she continues to have more of these sx. Recommended that she d/c mirtazepine, gave reassurance as regards decreased appetite and continue to monitor. Reassurance with benign exam, stable weights, recent colonoscopy. Will obtain CMP to assess liver function, bilirubin; can consider RUQ [place] or repeat EGD if continued significant symptoms. Follow up with PCP in 2 weeks. -CMP for liver function     Shirlean Mylar, MD [facility]

## 2020-12-30 LAB — COMPREHENSIVE METABOLIC PANEL
ALT: 6 IU/L (ref 0–32)
AST: 15 IU/L (ref 0–40)
Albumin/Globulin Ratio: 1.8 (ref 1.2–2.2)
Albumin: 4.4 g/dL (ref 3.8–4.8)
Alkaline Phosphatase: 110 IU/L (ref 44–121)
BUN/Creatinine Ratio: 11 — ABNORMAL LOW (ref 12–28)
BUN: 13 mg/dL (ref 8–27)
Bilirubin Total: 0.3 mg/dL (ref 0.0–1.2)
CO2: 22 mmol/L (ref 20–29)
Calcium: 9.9 mg/dL (ref 8.7–10.3)
Chloride: 97 mmol/L (ref 96–106)
Creatinine, Ser: 1.21 mg/dL — ABNORMAL HIGH (ref 0.57–1.00)
GFR calc Af Amer: 55 mL/min/{1.73_m2} — ABNORMAL LOW (ref >59)
GFR calc non Af Amer: 48 mL/min/{1.73_m2} — ABNORMAL LOW (ref >59)
Globulin, Total: 2.5 g/dL (ref 1.5–4.5)
Glucose: 59 mg/dL — ABNORMAL LOW (ref 65–99)
Potassium: 4.6 mmol/L (ref 3.5–5.2)
Sodium: 138 mmol/L (ref 134–144)
Total Protein: 6.9 g/dL (ref 6.0–8.5)

## 2020-12-30 NOTE — Assessment & Plan Note (Signed)
Patient with elevation of BP after running out of spironolactone 25 mg. Also on lisinopril 40 mg, metoprolol tartrate 100 mg BID. Will refill spironolactone 25 mg. Patient has BP cuff at home. Recommend she take her BP at the same time every day for a week and document BP for accurate data. She has follow up with PCP, Dr. Constance Goltz, in 2 weeks, can review data at that time. Can consider referral to ambulatory cuff clinic with pharmacy team if no improvement. Return precautions for hypertensive urgency given. - CMP obtained today to check scr and K

## 2020-12-30 NOTE — Assessment & Plan Note (Signed)
Patient reports decreased appetite and vomiting x3, however weight is stable at 170+ lbs and patient has BMI of 29 and DMT2 that is borderline uncontrolled. She had colonoscopy in 2020 that was normal, EGDs in 2005 and 2008 that were also normal. She has mirtazepine from 2019 that she takes on occasion for her appetite. As she has no weight loss, is obese, and has DMT2, this is not appropriate therapy. Patient with only 3 episodes of emesis, recommend she keep symptom journal with what she ate/did if she continues to have more of these sx. Recommended that she d/c mirtazepine, gave reassurance as regards decreased appetite and continue to monitor. Reassurance with benign exam, stable weights, recent colonoscopy. Will obtain CMP to assess liver function, bilirubin; can consider RUQ [place] or repeat EGD if continued significant symptoms. Follow up with PCP in 2 weeks. -CMP for liver function

## 2020-12-30 NOTE — Assessment & Plan Note (Signed)
Patient with paranoid hallucinations with Remus Loffler, with age also increases risk of falls. Recommend d/c ambien. Reviewed sleep hygiene with patient, she will change TV watching, attempt melatonin to stay asleep (with 6 week drug holidays), will try reading and hot tea for bed time routine. Follow up if no improvement.

## 2021-01-09 ENCOUNTER — Other Ambulatory Visit: Payer: Self-pay | Admitting: Family Medicine

## 2021-01-09 DIAGNOSIS — I10 Essential (primary) hypertension: Secondary | ICD-10-CM

## 2021-01-12 NOTE — Telephone Encounter (Signed)
This patient is on 100mg  metoprolol.  This is 50mg .  Does she need a refill of the 100mg ?

## 2021-01-14 ENCOUNTER — Ambulatory Visit (INDEPENDENT_AMBULATORY_CARE_PROVIDER_SITE_OTHER): Payer: Self-pay | Admitting: Family Medicine

## 2021-01-14 ENCOUNTER — Other Ambulatory Visit: Payer: Self-pay

## 2021-01-14 ENCOUNTER — Encounter: Payer: Self-pay | Admitting: Family Medicine

## 2021-01-14 VITALS — BP 152/88 | HR 67 | Ht 64.0 in | Wt 168.0 lb

## 2021-01-14 DIAGNOSIS — G4709 Other insomnia: Secondary | ICD-10-CM

## 2021-01-14 DIAGNOSIS — Z794 Long term (current) use of insulin: Secondary | ICD-10-CM

## 2021-01-14 DIAGNOSIS — I10 Essential (primary) hypertension: Secondary | ICD-10-CM

## 2021-01-14 DIAGNOSIS — E119 Type 2 diabetes mellitus without complications: Secondary | ICD-10-CM

## 2021-01-14 DIAGNOSIS — R630 Anorexia: Secondary | ICD-10-CM

## 2021-01-14 LAB — POCT GLYCOSYLATED HEMOGLOBIN (HGB A1C): Hemoglobin A1C: 6.9 % — AB (ref 4.0–5.6)

## 2021-01-14 NOTE — Patient Instructions (Signed)
It was nice to see you again today,  We discussed the following things today:  -For your insomnia, try changing your behavior surrounding sleep.  I want you to bed 8 or 9 hours before you plan on waking up in the morning.  Do not lay in bed prior to that.  Do not watch TV, look at your phone or computer or otherwise have the lights on in the bedroom when it is time to go to bed.  Your bedroom should be completely dark and no distracting noises.  -For your diabetes I want you to continue taking your Lantus as prescribed.  If your blood sugar ever goes below 100 when you take it in the morning before eating anything you can dial back your Lantus by 2 units (if you are taking 7 units you can change to 5 that day)  -We will continue your blood pressure medications at the current dose.  I will get a blood test to check your potassium.  I will let you know the results when I get them.  I would like to follow-up with you in 1 month to check your weight and follow-up on these issues.  Have a great day,  Frederic Jericho, MD

## 2021-01-14 NOTE — Progress Notes (Addendum)
SUBJECTIVE:   CHIEF COMPLAINT / HPI:   Htn: Patient currently taking only medication prescribed.  She does not need more metoprolol at this time.  Unsure why a prescription refill was sent in for metoprolol at 50 mg.  Patient brought in recorded blood pressure values since her last visit.    Insomnia: Patient is not using the Ambien anymore.  She states she is not sleeping well without the medication.  Goes to bed around 7 PM, watches TV and then dozes off for an hour before waking up again and sleeping for the rest of the night.  Usually gets out of bed at 6 or 7 AM.  She wakes up multiple times throughout the night.  While taking the Ambien she would sleep through the night.  Tried taking melatonin and continues to take it but feels like it is not working.  Patient states she does not feel depressed and actually excited that her new grandson is home from the hospital.  DM: Patient compliant with her diabetes medication.  Her Lantus dose is 7 units.  She uses 8 units if it is above 140 in the morning fasting.  She has not had to increase it due to hypoglycemic episodes.  Does not have any hypoglycemic episodes that she is aware of, no episodes of weakness.  Decreased appetite.:  Patient states she still has a decreased appetite.  No issues with swallowing or dysphagia.  No nausea vomiting or diarrhea except for 2 isolated episodes that occurred while she was cooking food.  Believes it was triggered by smelling food she was concerned.  PERTINENT  PMH / PSH: Insomnia, HTN, DM  OBJECTIVE:   BP (!) 152/88   Pulse 67   Ht 5\' 4"  (1.626 m)   Wt 168 lb (76.2 kg)   SpO2 98%   BMI 28.84 kg/m   General: Alert and oriented.  No acute distress.  Accompanied by daughter. CV: Regular rate and rhythm, no murmurs. Pulmonary: Clear to auscultation bilaterally, Extremities: No pedal edema bilaterally. Psych: Pleasant affect.  ASSESSMENT/PLAN:   HYPERTENSION, BENIGN SYSTEMIC Blood pressure that  patient recorded while at home ranges from 130s systolic to 150 systolic.  Average appears to be between 142 low 150s.  Had 1 outlier greater than 180.  Given that she is already on high doses of 3 medications and intolerant of amlodipine or thiazides, will abstain from changing medications at this time.  Need to add medication in future we need to increase the spironolactone, although there is concern about the effect on her electrolytes related to the modest effect on blood pressure we would achieve  Diabetes mellitus (HCC) Her last BMP appeared to show hyperglycemia 59.  Patient denies feeling any hypoglycemic episodes or noting any hypoglycemic values her meter.  Continued to advised patient to titrate the Lantus dose as needed and also reminded her to decrease the Lantus dose if it was ever below 100 fasting.  A1c was 6.9 today which was better than previous.  We will continue to monitor in 3 months.  Decreased appetite Patient has lost a few pounds since her last visit less than a month ago.  Prior to this weight has been largely stable.  No signs of dysphagia or vomiting or diarrhea outside of 2 isolated incidents. Had a colonoscopy performed in 2020. Likely due to age-related changes in sense of taste and appetite but if patient continues to lose weight we can consider sending her to gastroenterology.  Insomnia Currently not on any sleep medications other than melatonin.  Large part is appears to be due to sleep behavior patterns and poor sleep hygiene.  Discussed with patient getting to bed approximately 8 to 9 hours prior to when she plans on waking up in the morning.  Advised her not to watch TV or have any other screens on while in bed and to have the lights completely off.  Discussed ways to keep her mind off things that may be preoccupying her thoughts at night.  We will follow-up at next visit.     Monica Kitty, MD [facility]

## 2021-01-15 LAB — BASIC METABOLIC PANEL
BUN/Creatinine Ratio: 16 (ref 12–28)
BUN: 18 mg/dL (ref 8–27)
CO2: 22 mmol/L (ref 20–29)
Calcium: 10.2 mg/dL (ref 8.7–10.3)
Chloride: 101 mmol/L (ref 96–106)
Creatinine, Ser: 1.1 mg/dL — ABNORMAL HIGH (ref 0.57–1.00)
Glucose: 64 mg/dL — ABNORMAL LOW (ref 65–99)
Potassium: 4.7 mmol/L (ref 3.5–5.2)
Sodium: 139 mmol/L (ref 134–144)
eGFR: 56 mL/min/{1.73_m2} — ABNORMAL LOW (ref >59)

## 2021-01-15 NOTE — Assessment & Plan Note (Signed)
Patient has lost a few pounds since her last visit less than a month ago.  Prior to this weight has been largely stable.  No signs of dysphagia or vomiting or diarrhea outside of 2 isolated incidents. Had a colonoscopy performed in 2020. Likely due to age-related changes in sense of taste and appetite but if patient continues to lose weight we can consider sending her to gastroenterology.

## 2021-01-15 NOTE — Assessment & Plan Note (Signed)
Her last BMP appeared to show hyperglycemia 59.  Patient denies feeling any hypoglycemic episodes or noting any hypoglycemic values her meter.  Continued to advised patient to titrate the Lantus dose as needed and also reminded her to decrease the Lantus dose if it was ever below 100 fasting.  A1c was 6.9 today which was better than previous.  We will continue to monitor in 3 months.

## 2021-01-15 NOTE — Assessment & Plan Note (Signed)
Blood pressure that patient recorded while at home ranges from 130s systolic to 150 systolic.  Average appears to be between 142 low 150s.  Had 1 outlier greater than 180.  Given that she is already on high doses of 3 medications and intolerant of amlodipine or thiazides, will abstain from changing medications at this time.  Need to add medication in future we need to increase the spironolactone, although there is concern about the effect on her electrolytes related to the modest effect on blood pressure we would achieve

## 2021-01-15 NOTE — Assessment & Plan Note (Signed)
Currently not on any sleep medications other than melatonin.  Large part is appears to be due to sleep behavior patterns and poor sleep hygiene.  Discussed with patient getting to bed approximately 8 to 9 hours prior to when she plans on waking up in the morning.  Advised her not to watch TV or have any other screens on while in bed and to have the lights completely off.  Discussed ways to keep her mind off things that may be preoccupying her thoughts at night.  We will follow-up at next visit.

## 2021-01-19 ENCOUNTER — Encounter: Payer: Self-pay | Admitting: Family Medicine

## 2021-01-20 ENCOUNTER — Other Ambulatory Visit: Payer: Self-pay | Admitting: Family Medicine

## 2021-01-20 DIAGNOSIS — S62645A Nondisplaced fracture of proximal phalanx of left ring finger, initial encounter for closed fracture: Secondary | ICD-10-CM

## 2021-01-21 ENCOUNTER — Other Ambulatory Visit: Payer: Self-pay | Admitting: Family Medicine

## 2021-01-21 MED FILL — traMADol HCL 50 MG TABS: 50 | 10 days supply | Qty: 30 | Fill #0

## 2021-01-24 ENCOUNTER — Other Ambulatory Visit: Payer: Self-pay | Admitting: Family Medicine

## 2021-01-24 DIAGNOSIS — E1169 Type 2 diabetes mellitus with other specified complication: Secondary | ICD-10-CM

## 2021-02-16 ENCOUNTER — Other Ambulatory Visit: Payer: Self-pay | Admitting: Family Medicine

## 2021-02-16 DIAGNOSIS — S62645A Nondisplaced fracture of proximal phalanx of left ring finger, initial encounter for closed fracture: Secondary | ICD-10-CM

## 2021-02-17 ENCOUNTER — Other Ambulatory Visit (HOSPITAL_COMMUNITY): Payer: Self-pay

## 2021-02-17 MED ORDER — TRAMADOL HCL 50 MG PO TABS
ORAL_TABLET | Freq: Three times a day (TID) | ORAL | 0 refills | Status: DC | PRN
  Filled 2021-02-17: qty 30, 10d supply, fill #0

## 2021-03-01 ENCOUNTER — Other Ambulatory Visit: Payer: Self-pay

## 2021-03-01 ENCOUNTER — Encounter: Payer: Self-pay | Admitting: Family Medicine

## 2021-03-01 ENCOUNTER — Ambulatory Visit (INDEPENDENT_AMBULATORY_CARE_PROVIDER_SITE_OTHER): Payer: Self-pay | Admitting: Family Medicine

## 2021-03-01 DIAGNOSIS — R630 Anorexia: Secondary | ICD-10-CM

## 2021-03-01 DIAGNOSIS — Z794 Long term (current) use of insulin: Secondary | ICD-10-CM

## 2021-03-01 DIAGNOSIS — E119 Type 2 diabetes mellitus without complications: Secondary | ICD-10-CM

## 2021-03-01 DIAGNOSIS — G4709 Other insomnia: Secondary | ICD-10-CM

## 2021-03-01 NOTE — Assessment & Plan Note (Signed)
Weight is stabilized from last visit.  Decreased appetite appears to be due to age-related decreased sense of taste.  We will continue to monitor weight subsequent visits.

## 2021-03-01 NOTE — Progress Notes (Signed)
    SUBJECTIVE:   CHIEF COMPLAINT / HPI:   Insomnia: Patient states that her insomnia has improved.  Finds it easier to fall asleep and when she wakes up to go to the bathroom night finds it easier to go back to sleep.  Sleeps well about 1030 or 11 PM until 7 AM.  Takes her melatonin at around 9 PM and starts to get sleep at 930.  Discussed taking it at 930 if she wants to 10 PM.  Weight loss: Patient states that she does "do not taste the same".  She does state that when she finds something good to eat she will have no difficulty eating it.  For example she had potato salad hand, cornbread, green beans for Easter yesterday.  DM: Patient is taking Metformin, Lantus, NovoLog 3 units 3 times daily.  The other day she felt like she had low blood sugar when she woke up at night.  She checked it and her blood sugar was 26.  She drank orange juice to feel better.  PERTINENT  PMH / PSH: Diabetes, insomnia  OBJECTIVE:   BP 130/72   Pulse 75   Ht 5\' 4"  (1.626 m)   Wt 167 lb (75.8 kg)   SpO2 99%   BMI 28.67 kg/m   General: Alert and oriented.  No acute distress. Pulmonary: No respiratory distress.  ASSESSMENT/PLAN:   Diabetes mellitus (HCC) Patient still having hypoglycemic episodes.  Had a reported blood sugar of 26 1 night.  Taking Lantus 7 units, NovoLog 3 units 3 times daily, Metformin. -We will stop NovoLog -Follow-up in 3 to 4 weeks and bring glucose monitor to check blood sugar values.  Insomnia Doing much better since stopping her Ambien and starting melatonin.  Advised patient to start taking the medication 30 minutes prior to when she wants to go to bed.  Decreased appetite Weight is stabilized from last visit.  Decreased appetite appears to be due to age-related decreased sense of taste.  We will continue to monitor weight subsequent visits.     , MD [facility]

## 2021-03-01 NOTE — Assessment & Plan Note (Signed)
Patient still having hypoglycemic episodes.  Had a reported blood sugar of 26 1 night.  Taking Lantus 7 units, NovoLog 3 units 3 times daily, Metformin. -We will stop NovoLog -Follow-up in 3 to 4 weeks and bring glucose monitor to check blood sugar values.

## 2021-03-01 NOTE — Patient Instructions (Signed)
It was nice to see you today,  For your diabetes I would like you to stop taking your short acting insulin (NovoLog).  I think this is what is causing your blood sugars to get so low.  Continue taking the Lantus and Metformin.  For your insomnia, if you would like to go to sleep at 10:00 I would suggest taking your Metformin at 930 since it appears to start taking effect 30 minutes after taking it.  I like to see you back in 3 to 4 weeks.  Please bring your blood sugar meter at that time.  Have a great day,  Frederic Jericho, MD

## 2021-03-01 NOTE — Assessment & Plan Note (Signed)
Doing much better since stopping her Ambien and starting melatonin.  Advised patient to start taking the medication 30 minutes prior to when she wants to go to bed.

## 2021-03-16 ENCOUNTER — Other Ambulatory Visit: Payer: Self-pay | Admitting: Family Medicine

## 2021-03-16 ENCOUNTER — Other Ambulatory Visit (HOSPITAL_COMMUNITY): Payer: Self-pay

## 2021-03-16 DIAGNOSIS — S62645A Nondisplaced fracture of proximal phalanx of left ring finger, initial encounter for closed fracture: Secondary | ICD-10-CM

## 2021-03-16 MED ORDER — TRAMADOL HCL 50 MG PO TABS
ORAL_TABLET | Freq: Three times a day (TID) | ORAL | 0 refills | Status: DC | PRN
  Filled 2021-03-16: qty 30, 10d supply, fill #0

## 2021-03-19 ENCOUNTER — Ambulatory Visit: Payer: No Typology Code available for payment source | Admitting: Family Medicine

## 2021-03-23 ENCOUNTER — Other Ambulatory Visit: Payer: Self-pay | Admitting: Family Medicine

## 2021-03-23 DIAGNOSIS — I10 Essential (primary) hypertension: Secondary | ICD-10-CM

## 2021-03-29 ENCOUNTER — Ambulatory Visit (INDEPENDENT_AMBULATORY_CARE_PROVIDER_SITE_OTHER): Payer: Self-pay | Admitting: Family Medicine

## 2021-03-29 ENCOUNTER — Encounter: Payer: Self-pay | Admitting: Family Medicine

## 2021-03-29 ENCOUNTER — Other Ambulatory Visit: Payer: Self-pay

## 2021-03-29 VITALS — BP 130/60 | HR 77 | Ht 64.0 in | Wt 159.0 lb

## 2021-03-29 DIAGNOSIS — E119 Type 2 diabetes mellitus without complications: Secondary | ICD-10-CM

## 2021-03-29 DIAGNOSIS — R634 Abnormal weight loss: Secondary | ICD-10-CM

## 2021-03-29 DIAGNOSIS — Z794 Long term (current) use of insulin: Secondary | ICD-10-CM

## 2021-03-29 LAB — GLUCOSE, POCT (MANUAL RESULT ENTRY): POC Glucose: 181 mg/dL — AB (ref 70–99)

## 2021-03-29 MED ORDER — FLUTICASONE PROPIONATE 50 MCG/ACT NA SUSP: 2.0000 | Freq: Every day | NASAL | 6 refills | Status: DC

## 2021-03-29 NOTE — Progress Notes (Signed)
    SUBJECTIVE:   CHIEF COMPLAINT / HPI:   DM: patient not taking her novolog as we discussed on previous visit. Not having any hypoglycemic episodes.  She states her blood sugars have been higher.  She states she drinks orange juice regularly.  Didn't know juice was bad for diabetics.   Weight loss: patient doesn't feel like eating.  Again denies dysphagia, abd pain, vomiting or diarrhea.  Does state  That sometimes looking at food will make her nauseous.  Denies food tastes 'bad'.  If she finds something she has a craving for she will eat that. For example, she ate fish for two days in a row last week.   PERTINENT  PMH / PSH: DM  OBJECTIVE:   BP 130/60   Pulse 77   Ht 5\' 4"  (1.626 m)   Wt 159 lb (72.1 kg)   SpO2 96%   BMI 27.29 kg/m   General: Alert and oriented.  No acute distress GI: Soft, nontender.  No masses.  Normal bowel sounds.  ASSESSMENT/PLAN:   Diabetes mellitus (HCC) Blood sugar readings overall higher.  Not due for A1c for another month.  Her meter did show one reading of 35 on 4/28.  Otherwise normal range.  Advised patient to avoid juices or sugary drinks unless she feels like she is having a hypoglycemic episode.  Advised to continue holding off NovoLog.  We can improve her glycemic control at future visits with changes to other medications.  Should not add back NovoLog or other short acting.  Loss of weight Approximately 20 pounds over several months.  Currently no specific cause identified for weight loss given her lack of other symptoms other than decreased appetite.  Most likely age-related.  We will repeat CBC and CMP and get TSH today.  Had a colonoscopy in 2020 that was normal except for diverticuli.     2021, MD [facility]

## 2021-03-29 NOTE — Patient Instructions (Addendum)
It was nice to see you today,  Please continue using your metformin and Lantus.  Do not use the short acting insulin.  If you notice you are having any very low blood sugars, less than 60, drink some juice and recheck in 15 minutes.  Otherwise, avoid sugary beverages like orange juice.  For your weight loss, I will get some blood test.  For now we can continue to monitor it.  It may plateau at some point.  Please follow-up in approximately 1 month to get [name] rechecked.  For your runny nose, we will use Flonase.  Use this every day for the next month.  Spray twice in each nostril once a day.  If this does not help matters in 1 month we can refer you to the ear nose and throat doctor.  Have a great day,  Frederic Jericho, MD

## 2021-03-30 LAB — CBC
Hematocrit: 32 % — ABNORMAL LOW (ref 34.0–46.6)
Hemoglobin: 10.3 g/dL — ABNORMAL LOW (ref 11.1–15.9)
MCH: 27.6 pg (ref 26.6–33.0)
MCHC: 32.2 g/dL (ref 31.5–35.7)
MCV: 86 fL (ref 79–97)
Platelets: 230 10*3/uL (ref 150–450)
RBC: 3.73 x10E6/uL — ABNORMAL LOW (ref 3.77–5.28)
RDW: 14.5 % (ref 11.7–15.4)
WBC: 5.9 10*3/uL (ref 3.4–10.8)

## 2021-03-30 LAB — COMPREHENSIVE METABOLIC PANEL
ALT: 6 IU/L (ref 0–32)
AST: 10 IU/L (ref 0–40)
Albumin/Globulin Ratio: 1.9 (ref 1.2–2.2)
Albumin: 4.2 g/dL (ref 3.8–4.8)
Alkaline Phosphatase: 83 IU/L (ref 44–121)
BUN/Creatinine Ratio: 15 (ref 12–28)
BUN: 16 mg/dL (ref 8–27)
Bilirubin Total: 0.3 mg/dL (ref 0.0–1.2)
CO2: 23 mmol/L (ref 20–29)
Calcium: 10.2 mg/dL (ref 8.7–10.3)
Chloride: 98 mmol/L (ref 96–106)
Creatinine, Ser: 1.06 mg/dL — ABNORMAL HIGH (ref 0.57–1.00)
Globulin, Total: 2.2 g/dL (ref 1.5–4.5)
Glucose: 174 mg/dL — ABNORMAL HIGH (ref 65–99)
Potassium: 4.9 mmol/L (ref 3.5–5.2)
Sodium: 137 mmol/L (ref 134–144)
Total Protein: 6.4 g/dL (ref 6.0–8.5)
eGFR: 59 mL/min/{1.73_m2} — ABNORMAL LOW (ref >59)

## 2021-03-30 LAB — TSH: TSH: 1.1 u[IU]/mL (ref 0.450–4.500)

## 2021-03-30 NOTE — Assessment & Plan Note (Signed)
Blood sugar readings overall higher.  Not due for A1c for another month.  Her meter did show one reading of 35 on 4/28.  Otherwise normal range.  Advised patient to avoid juices or sugary drinks unless she feels like she is having a hypoglycemic episode.  Advised to continue holding off NovoLog.  We can improve her glycemic control at future visits with changes to other medications.  Should not add back NovoLog or other short acting.

## 2021-03-30 NOTE — Assessment & Plan Note (Signed)
Approximately 20 pounds over several months.  Currently no specific cause identified for weight loss given her lack of other symptoms other than decreased appetite.  Most likely age-related.  We will repeat CBC and CMP and get TSH today.  Had a colonoscopy in 2020 that was normal except for diverticuli.

## 2021-04-02 ENCOUNTER — Encounter: Payer: Self-pay | Admitting: Family Medicine

## 2021-04-05 ENCOUNTER — Other Ambulatory Visit (HOSPITAL_COMMUNITY): Payer: Self-pay

## 2021-04-09 ENCOUNTER — Other Ambulatory Visit (HOSPITAL_COMMUNITY): Payer: Self-pay

## 2021-04-09 ENCOUNTER — Other Ambulatory Visit: Payer: Self-pay | Admitting: Family Medicine

## 2021-04-09 DIAGNOSIS — S62645A Nondisplaced fracture of proximal phalanx of left ring finger, initial encounter for closed fracture: Secondary | ICD-10-CM

## 2021-04-09 MED ORDER — TRAMADOL HCL 50 MG PO TABS
ORAL_TABLET | Freq: Three times a day (TID) | ORAL | 0 refills | Status: DC | PRN
  Filled 2021-04-09: qty 30, 10d supply, fill #0

## 2021-04-23 ENCOUNTER — Encounter: Payer: Self-pay | Admitting: Family Medicine

## 2021-04-23 ENCOUNTER — Other Ambulatory Visit: Payer: Self-pay

## 2021-04-23 ENCOUNTER — Ambulatory Visit (INDEPENDENT_AMBULATORY_CARE_PROVIDER_SITE_OTHER): Payer: No Typology Code available for payment source | Admitting: Family Medicine

## 2021-04-23 VITALS — BP 112/60 | HR 32 | Ht 64.0 in | Wt 156.8 lb

## 2021-04-23 DIAGNOSIS — R634 Abnormal weight loss: Secondary | ICD-10-CM

## 2021-04-23 DIAGNOSIS — E119 Type 2 diabetes mellitus without complications: Secondary | ICD-10-CM

## 2021-04-23 DIAGNOSIS — Z794 Long term (current) use of insulin: Secondary | ICD-10-CM

## 2021-04-23 LAB — POCT GLYCOSYLATED HEMOGLOBIN (HGB A1C): HbA1c, POC (controlled diabetic range): 7.5 % — AB (ref 0.0–7.0)

## 2021-04-23 NOTE — Patient Instructions (Signed)
It was nice to see you today,  I would like to send you to the gastroenterologist to see if they can help work-up the reason why you are having unexplained weight loss.  I would like them to evaluate you before you restart any mirtazapine.  Graph for your diabetes please continue what you are doing with the same dose of long-acting insulin.  Do not take any of your short acting insulin anymore.  We will recheck it again in 3 months.  Have a great day,  Frederic Jericho, MD

## 2021-04-23 NOTE — Progress Notes (Signed)
    SUBJECTIVE:   CHIEF COMPLAINT / HPI:   DM: Has stopped taking her short acting insulin like we discussed.  Continues to only take the long-acting 7 units insulin.  Has noticed her blood sugars have been higher.  Showed me her readings from her meter.  She usually takes her blood sugar twice a day.  Weight loss: Patient continues to lose weight due to decreased appetite and "food not tasting good".  Continues to deny abdominal pain, nausea vomiting or diarrhea.  Denies night sweats.  Denies cough.  Had this issue several years ago and she took mirtazapine 7.5 mg and this improved appetite.  PERTINENT  PMH / PSH: DM 2  OBJECTIVE:   BP 112/60   Pulse (!) 32   Ht 5\' 4"  (1.626 m)   Wt 156 lb 12.8 oz (71.1 kg)   SpO2 96%   BMI 26.91 kg/m   General: Alert and oriented.  No acute distress. HEENT: PERRLA, EOMI. Neck: No thyromegaly, no masses.  No lymphadenopathy. CV: Regular rate and rhythm, no murmurs Pulmonary: Lungs clear to auscultation bilaterally, no wheezes or crackles. GI: Soft, nontender.  Normal bowel sounds.  ASSESSMENT/PLAN:   Diabetes mellitus (HCC) Blood sugar is slightly increased but A1c overall tolerable at 7.5.  We will not make any changes today.  Will follow-up in 3 months.  If getting above 7.5 at that time we can increase her Lantus.  Home readings on glucometer normal 90-110 fasting blood sugars on most days.  Does get into the 300s in the afternoon after eating.  No hypoglycemic values similar to what was seen prior to stopping the short acting insulin.  Foot exam normal today.  Loss of weight This episode similar to 2016 which was also worked up with no cause identified.  At that time she was started on mirtazapine 7.5 mg and since that time she showed improvement in appetite and weight.  At some point along the way her mirtazapine was increased and subsequently became less effective.  It was then stopped.  Since that time she has had weight loss and decreased  appetite.  Will refer to GI.  After that we can consider restarting the mirtazapine at 7.5 mg     2017, MD [facility]

## 2021-04-24 NOTE — Assessment & Plan Note (Signed)
Blood sugar is slightly increased but A1c overall tolerable at 7.5.  We will not make any changes today.  Will follow-up in 3 months.  If getting above 7.5 at that time we can increase her Lantus.  Home readings on glucometer normal 90-110 fasting blood sugars on most days.  Does get into the 300s in the afternoon after eating.  No hypoglycemic values similar to what was seen prior to stopping the short acting insulin.  Foot exam normal today.

## 2021-04-24 NOTE — Assessment & Plan Note (Signed)
This episode similar to 2016 which was also worked up with no cause identified.  At that time she was started on mirtazapine 7.5 mg and since that time she showed improvement in appetite and weight.  At some point along the way her mirtazapine was increased and subsequently became less effective.  It was then stopped.  Since that time she has had weight loss and decreased appetite.  Will refer to GI.  After that we can consider restarting the mirtazapine at 7.5 mg

## 2021-05-05 ENCOUNTER — Other Ambulatory Visit: Payer: Self-pay | Admitting: Family Medicine

## 2021-05-05 ENCOUNTER — Other Ambulatory Visit (HOSPITAL_COMMUNITY): Payer: Self-pay

## 2021-05-05 DIAGNOSIS — S62645A Nondisplaced fracture of proximal phalanx of left ring finger, initial encounter for closed fracture: Secondary | ICD-10-CM

## 2021-05-05 MED ORDER — TRAMADOL HCL 50 MG PO TABS
ORAL_TABLET | Freq: Three times a day (TID) | ORAL | 0 refills | Status: DC | PRN
  Filled 2021-05-05: qty 30, 10d supply, fill #0

## 2021-05-12 ENCOUNTER — Other Ambulatory Visit: Payer: Self-pay | Admitting: Family Medicine

## 2021-05-12 ENCOUNTER — Telehealth: Payer: Self-pay

## 2021-05-12 DIAGNOSIS — R634 Abnormal weight loss: Secondary | ICD-10-CM

## 2021-05-12 NOTE — Telephone Encounter (Signed)
It looks like I didn't place the original referral.  Just put in a referral to GI for weight loss.

## 2021-05-12 NOTE — Telephone Encounter (Signed)
Patient calls nurse line regarding continued concerns for weight loss. Per last OV note, patient was to receive referral to GI specialist. I am unable to find this in chart review.   Please advise.   Veronda Prude, RN

## 2021-05-26 ENCOUNTER — Other Ambulatory Visit: Payer: Self-pay | Admitting: Family Medicine

## 2021-05-26 ENCOUNTER — Other Ambulatory Visit: Payer: Self-pay

## 2021-05-26 ENCOUNTER — Ambulatory Visit (HOSPITAL_COMMUNITY)
Admission: EM | Admit: 2021-05-26 | Discharge: 2021-05-26 | Disposition: A | Discharge status: Home / Self Care | Payer: No Typology Code available for payment source | Attending: Family Medicine | Admitting: Family Medicine

## 2021-05-26 ENCOUNTER — Other Ambulatory Visit (HOSPITAL_COMMUNITY): Payer: Self-pay

## 2021-05-26 ENCOUNTER — Encounter (HOSPITAL_COMMUNITY): Payer: Self-pay

## 2021-05-26 DIAGNOSIS — J069 Acute upper respiratory infection, unspecified: Secondary | ICD-10-CM

## 2021-05-26 MED ORDER — GUAIFENESIN ER 600 MG PO TB12
600.0000 mg | ORAL_TABLET | Freq: Two times a day (BID) | ORAL | 0 refills | Status: DC
  Filled 2021-05-26: qty 30, 15d supply, fill #0

## 2021-05-26 MED ORDER — PROMETHAZINE-DM 6.25-15 MG/5ML PO SYRP
5.0000 mL | ORAL_SOLUTION | Freq: Four times a day (QID) | ORAL | 0 refills | Status: DC | PRN
  Filled 2021-05-26: qty 100, 5d supply, fill #0

## 2021-05-26 MED ORDER — ALBUTEROL SULFATE HFA 108 (90 BASE) MCG/ACT IN AERS
1.0000 | INHALATION_SPRAY | Freq: Four times a day (QID) | RESPIRATORY_TRACT | 0 refills | Status: DC | PRN
  Filled 2021-05-26: qty 18, 25d supply, fill #0

## 2021-05-26 NOTE — ED Provider Notes (Signed)
[facility]    CSN: [id number] Arrival date & time: 05/26/21  8315      History   Chief Complaint Chief Complaint  Patient presents with   Cough   Sore Throat   Headache    HPI [name] is a 65 y.o. female.   Patient presenting today with 4-day history of dry hacking cough, sore throat, headache, malaise, congestion, hoarseness.  Denies fever, chills, body aches, chest pain, shortness of breath, abdominal pain, nausea vomiting or diarrhea.  So far taking Alka-Seltzer cold plus with minimal relief.  Past medical history significant for insulin-dependent diabetes, hypertension, hyperlipidemia, CVA, anemia.  No known pulmonary disease or seasonal allergies.  Home COVID test has been negative so far.  No known sick contacts.   Past Medical History:  Diagnosis Date   Anemia    Benign heart murmur    Diabetes mellitus    Gastric ulcer    History of colonoscopy    Hyperlipidemia    Hypertension    Stroke [facility])    2008 - right arm/leg numbness.       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 right thumb 12/29/2019   Thumb swelling 12/04/2019   Atrophic vaginitis 01/10/2019   Acute cystitis 01/08/2019   Screen for colon cancer 09/21/2018   Chronic pain 09/14/2017   Loss of weight 11/25/2014   Plantar wart 05/10/2013   Low back pain 05/06/2013   Diabetes mellitus (HCC) 10/15/2012   Cervical pain (neck) 03/14/2012   Insomnia 10/10/2011   Hallux rigidus of right foot 08/29/2011   CATARACTS 12/16/2009   SMOKELESS TOBACCO ABUSE 05/14/2008   OBESITY 11/30/2007   DIVERTICULOSIS, COLON W/HEM 08/15/2007   History of TIA (transient ischemic attack) 02/12/2007   Hyperlipidemia associated with type 2 diabetes mellitus (HCC) 01/11/2007   Iron deficiency anemia  01/11/2007   HYPERTENSION, BENIGN SYSTEMIC 01/11/2007    Past Surgical History:  Procedure Laterality Date   BREAST BIOPSY     BREAST EXCISIONAL BIOPSY     BTL  1988   COLONOSCOPY  01/13/2004   Colonoscopy - normal, diverticulosis    ENDOMETRIAL BIOPSY     Hx of endometrial Bx 2004 - negative for hyperplasia or malignancy. Patient does not remember reason for testing.   ESOPHAGOGASTRODUODENOSCOPY     normal   L and R breast lumpectomies benign  1970    OB History     Gravida  3   Para      Term      Preterm      AB      Living  3      SAB      IAB      Ectopic      Multiple      Live Births  3            Home Medications    Prior to Admission medications   Medication Sig Start Date End Date Taking? Authorizing Provider  albuterol (VENTOLIN HFA) 108 (90 Base) MCG/ACT inhaler Inhale 1-2 puffs into the lungs every 6 (six) hours as needed for wheezing or shortness of breath. 05/26/21  Yes Particia Nearing, PA-C  atorvastatin (LIPITOR) 40 MG tablet TAKE ONE TABLET BY MOUTH DAILY 01/25/21   Sandre Kitty, MD  guaiFENesin (  MUCINEX) 600 MG 12 hr tablet Take 1 tablet (600 mg total) by mouth 2 (two) times daily. 05/26/21  Yes Particia Nearing, PA-C  promethazine-dextromethorphan (PROMETHAZINE-DM) 6.25-15 MG/5ML syrup Take 5 mLs by mouth 4 (four) times daily as needed for cough. 05/26/21  Yes Particia Nearing, PA-C  aspirin 81 MG tablet Take 81 mg by mouth daily.    Rodolph Bong, MD  fluticasone [facility]) 50 MCG/ACT nasal spray Place 2 sprays into both nostrils daily. 03/29/21   Sandre Kitty, MD  glucose blood test strip Freestyle Freedom Strips. Check sugars 4 times a day. 11/09/16   Arvilla Market, MD  insulin aspart (NOVOLOG) 100 UNIT/ML injection INJECT 3 UNITS INTO THE SKIN 3 TIMES A DAY WITH MEALS 11/23/20   Sandre Kitty, MD  insulin glargine (LANTUS) 100 UNIT/ML injection INJECT 7 UNITS INTO THE SKIN DAILY 05/26/21   Littie Deeds,  MD  Insulin Syringe-Needle U-100 (B-D INS SYR ULTRAFINE .5CC/30G) 30G X 1/2" 0.5 ML MISC Inject 1 Syringe into the skin 4 (four) times daily - after meals and at bedtime. daily use of insulin 1 box - supply for one month 11/09/16   Arvilla Market, MD  Lancets Fine 28G MISC Check sugars twice a day    [provider]  lisinopril (ZESTRIL) 40 MG tablet Take 1 tablet (40 mg total) by mouth daily. 12/29/20   Shirlean Mylar, MD  metFORMIN (GLUCOPHAGE) 1000 MG tablet TAKE ONE TABLET BY MOUTH TWICE A DAY WITH A MEAL 10/16/20   Sandre Kitty, MD  metoprolol tartrate (LOPRESSOR) 100 MG tablet TAKE ONE TABLET BY MOUTH TWICE A DAY 03/23/21   Sandre Kitty, MD  polyethylene glycol powder (GLYCOLAX/MIRALAX) 17 GM/SCOOP powder Take 17 g by mouth daily. 10/14/19   Marthenia Rolling, DO  spironolactone (ALDACTONE) 25 MG tablet Take 1 tablet (25 mg total) by mouth at bedtime. 12/29/20   Shirlean Mylar, MD  traMADol Janean Sark) 50 MG tablet TAKE 1 TABLET BY MOUTH 3 TIMES DAILY AS NEEDED 05/05/21 11/01/21  Sandre Kitty, MD    Family History Family History  Problem Relation Age of Onset   Diabetes Mother    Alcohol abuse Father    Heart disease Father    Diabetes Sister    Colon cancer Neg Hx    Esophageal cancer Neg Hx    Rectal cancer Neg Hx    Stomach cancer Neg Hx     Social History Social History   Tobacco Use   Smoking status: Never   Smokeless tobacco: Former    Types: Snuff    Quit date: 01/28/2013   Tobacco comments:    Quit for 1.5 months in past. Current quit date 01/28/2013  Vaping Use   Vaping Use: Never used  Substance Use Topics   Alcohol use: Not Currently    Comment: hx excessive ETOH, quit 02/2007   Drug use: No     Allergies   Amlodipine and Hctz [hydrochlorothiazide]   Review of Systems Review of Systems Per HPI  Physical Exam Triage Vital Signs ED Triage Vitals  Enc Vitals Group     BP 05/26/21 0942 (!) 170/78     Pulse Rate 05/26/21 0942 87      Resp 05/26/21 0942 20     Temp 05/26/21 0942 99.9 F (37.7 C)     Temp Source 05/26/21 0942 Oral     SpO2 05/26/21 0942 94 %     Weight --  Height --      Head Circumference --      Peak Flow --      Pain Score 05/26/21 0943 6     Pain Loc --      Pain Edu? --      Excl. in GC? --    No data found.  Updated Vital Signs BP (!) 170/78 (BP Location: Right Arm)   Pulse 87   Temp 99.9 F (37.7 C) (Oral)   Resp 20   SpO2 94%   Visual Acuity Right Eye Distance:   Left Eye Distance:   Bilateral Distance:    Right Eye Near:   Left Eye Near:    Bilateral Near:     Physical Exam Vitals and nursing note reviewed.  Constitutional:      Appearance: Normal appearance. She is not ill-appearing.  HENT:     Head: Atraumatic.     Right Ear: Tympanic membrane normal.     Left Ear: Tympanic membrane normal.     Nose: Rhinorrhea present.     Mouth/Throat:     Mouth: Mucous membranes are moist.     Pharynx: Posterior oropharyngeal erythema present. No oropharyngeal exudate.  Eyes:     Extraocular Movements: Extraocular movements intact.     Conjunctiva/sclera: Conjunctivae normal.  Cardiovascular:     Rate and Rhythm: Normal rate and regular rhythm.     Heart sounds: Normal heart sounds.  Pulmonary:     Effort: Pulmonary effort is normal.     Breath sounds: Normal breath sounds. No wheezing or rales.  Abdominal:     General: Bowel sounds are normal. There is no distension.     Palpations: Abdomen is soft.     Tenderness: There is no abdominal tenderness. There is no guarding.  Musculoskeletal:        General: Normal range of motion.     Cervical back: Normal range of motion and neck supple.  Skin:    General: Skin is warm and dry.  Neurological:     Mental Status: She is alert and oriented to person, place, and time.  Psychiatric:        Mood and Affect: Mood normal.        Thought Content: Thought content normal.        Judgment: Judgment normal.     UC  Treatments / Results  Labs (all labs ordered are listed, but only abnormal results are displayed) Labs Reviewed - No data to display  EKG   Radiology No results found.  Procedures Procedures (including critical care time)  Medications Ordered in UC Medications - No data to display  Initial Impression / Assessment and Plan / UC Course  I have reviewed the triage vital signs and the nursing notes.  Pertinent labs & imaging results that were available during my care of the patient were reviewed by me and considered in my medical decision making (see chart for details).     Vitals and exam very reassuring today, suspect viral URI causing symptoms.  She declines repeat COVID testing as home COVID testing has been negative.  Will treat symptoms with albuterol inhaler for as needed use, Mucinex, Phenergan DM.  Discussed supportive home care and strict return precautions for worsening symptoms.  Final Clinical Impressions(s) / UC Diagnoses   Final diagnoses:  Viral URI with cough   Discharge Instructions   None    ED Prescriptions     Medication Sig Dispense Auth. Provider   albuterol (  VENTOLIN HFA) 108 (90 Base) MCG/ACT inhaler Inhale 1-2 puffs into the lungs every 6 (six) hours as needed for wheezing or shortness of breath. 18 g Particia Nearing, PA-C   guaiFENesin (MUCINEX) 600 MG 12 hr tablet Take 1 tablet (600 mg total) by mouth 2 (two) times daily. 30 tablet Joretta, [place], [place]   promethazine-dextromethorphan (PROMETHAZINE-DM) 6.25-15 MG/5ML syrup Take 5 mLs by mouth 4 (four) times daily as needed for cough. 100 mL Particia Nearing, [place]      PDMP not reviewed this encounter.   Audre, Cenc[place], [place] 05/26/21 1036

## 2021-05-26 NOTE — ED Triage Notes (Signed)
Pt presents with non productive cough, sore throat, and headache X 4 days.

## 2021-05-28 ENCOUNTER — Ambulatory Visit: Payer: No Typology Code available for payment source

## 2021-06-01 ENCOUNTER — Other Ambulatory Visit (HOSPITAL_COMMUNITY): Payer: Self-pay

## 2021-06-01 ENCOUNTER — Other Ambulatory Visit: Payer: Self-pay

## 2021-06-01 DIAGNOSIS — S62645A Nondisplaced fracture of proximal phalanx of left ring finger, initial encounter for closed fracture: Secondary | ICD-10-CM

## 2021-06-01 MED ORDER — TRAMADOL HCL 50 MG PO TABS
ORAL_TABLET | Freq: Three times a day (TID) | ORAL | 0 refills | Status: DC | PRN
  Filled 2021-06-01: qty 30, 10d supply, fill #0

## 2021-06-09 ENCOUNTER — Other Ambulatory Visit: Payer: Self-pay | Admitting: Family Medicine

## 2021-06-16 ENCOUNTER — Encounter: Payer: Self-pay | Admitting: Family Medicine

## 2021-06-16 ENCOUNTER — Ambulatory Visit (INDEPENDENT_AMBULATORY_CARE_PROVIDER_SITE_OTHER): Payer: Self-pay

## 2021-06-16 ENCOUNTER — Other Ambulatory Visit: Payer: Self-pay

## 2021-06-16 ENCOUNTER — Ambulatory Visit (INDEPENDENT_AMBULATORY_CARE_PROVIDER_SITE_OTHER): Payer: Self-pay | Admitting: Family Medicine

## 2021-06-16 VITALS — BP 132/78 | HR 73 | Ht 64.0 in | Wt 151.4 lb

## 2021-06-16 DIAGNOSIS — Z794 Long term (current) use of insulin: Secondary | ICD-10-CM

## 2021-06-16 DIAGNOSIS — E119 Type 2 diabetes mellitus without complications: Secondary | ICD-10-CM

## 2021-06-16 DIAGNOSIS — Z23 Encounter for immunization: Secondary | ICD-10-CM

## 2021-06-16 DIAGNOSIS — R630 Anorexia: Secondary | ICD-10-CM

## 2021-06-16 DIAGNOSIS — R634 Abnormal weight loss: Secondary | ICD-10-CM

## 2021-06-16 DIAGNOSIS — R4589 Other symptoms and signs involving emotional state: Secondary | ICD-10-CM

## 2021-06-16 DIAGNOSIS — Z1231 Encounter for screening mammogram for malignant neoplasm of breast: Secondary | ICD-10-CM

## 2021-06-16 DIAGNOSIS — I10 Essential (primary) hypertension: Secondary | ICD-10-CM

## 2021-06-16 MED ORDER — LISINOPRIL 40 MG PO TABS: 40.0000 mg | ORAL_TABLET | Freq: Every day | ORAL | 1 refills | Status: DC

## 2021-06-16 MED ORDER — MIRTAZAPINE 7.5 MG PO TABS: 7.5000 mg | ORAL_TABLET | Freq: Every day | ORAL | 2 refills | Status: DC

## 2021-06-16 NOTE — Patient Instructions (Addendum)
It was nice seeing you today!  Call [facility] at [phone] to see if you can schedule an appointment. If you have any issues, please give [place] a call and I can send in another referral if needed.  Start mirtazapine 7.5 mg nightly in the meantime to help with appetite.  They will call you for an eye appointment.  Call for an appointment for your mammogram.  Follow-up in 2 months for weight loss or sooner if needed.  Please arrive at least 15 minutes prior to your scheduled appointments.  Stay well, Monica Deeds, MD [facility] [phone]    Therapy and Counseling Resources Most providers on this list will take Medicaid. Patients with commercial insurance or Medicare should contact their insurance company to get a list of in network providers.  [facility]:Psychiatry and Counseling [address] [facility] [place] [place], [place] [address] [phone]  [facility]  [address] Suite [place], [place] [address]      [phone]  [facility] [address]  [place], [place] [address] [phone]  [facility] [address]  [place], [place] [address]       [phone]     [facility] (virtual only) [phone]  Jovita Kussmaul [facility] [address], [place], [place] [phone]  [facility]:  [address] [place] [phone]  [facility]:  [address] [place] Diener [phone]  [facility] (under & uninsured) [address]   [place] [phone]    [email or web address]    [facility] [address]  [place]    [phone]  [facility] -[address]     Phone:  [phone]     [facility]-  [address]  [phone]   [facility] [address]      [place], [place] [phone] ext  [phone]  [facility]: [address] [place], [place], [place]  [phone]   [facility] (Spanish therapist) [email or web address]  [address] [place]  [address]   [place] [address]    [phone]    [facility]   [address],  [place], [place]  [phone]   [facility]  [address] [place]  [phone]  [facility]  [address] [place], [place]        [phone]  Open Access/Walk In Clinic under & uninsured  [facility]  [address] [place], [place] [phone] Crisis [phone]  [facility] [place],  (Spanish)   [address], [place]: [phone]) 8:30 - 12; 1 - 2:30  [facility] [employer],  [address] [place], [place]    [phone]):8:30 - 12; 2 - 3PM  [facility] [employer],  [address]  [place]; [phone]):   Mon - Fri 8 AM - 5 PM  [facility] [address] [place]  MWF 12:30 to 3:00 or call to schedule an appointment  [phone]  Specific Provider options Psychology Today  [email or web address].psychologytoday.com/us click on find a therapist  enter your zip code left side and select or tailor a therapist for your specific need.   [facility] Provider Directory [email or web address]  (Medicaid)   Follow all drop down to find a provider  Social Support program [facility] [place] or [employer] [address], [place], [place] Recovery support and educational   24- Hour Availability:   [facility]  [address] [place], [place] [phone] Crisis [phone]  [facility] [phone]  [facility]  [phone]   [facility]  [phone] (after hours)  Therapeutic Alternative/Mobile Crisis    [phone]  [place] National Suicide Hotline  [phone] Len Childs)  Call 911 or go to emergency room  [facility]  [phone]);  [place] and [employer]  [phone]); [place], [place], [place], [place], [place], [place], [place]

## 2021-06-16 NOTE — Assessment & Plan Note (Signed)
Underlying mood disorder possibly contributing.  Continues to lose weight due to decreased appetite, will restart mirtazapine which she has done well with in the past.  GI referral order already placed, patient to contact their office and can place another referral order if needed. - mirtazapine 7.5 mg qhs - therapy resources given

## 2021-06-16 NOTE — Assessment & Plan Note (Signed)
Appears well controlled on current medications based on fasting sugars.  We will recheck A1c at follow-up visit. - amb ref ophtho for diabetic eye exam

## 2021-06-16 NOTE — Progress Notes (Signed)
    SUBJECTIVE:   CHIEF COMPLAINT / HPI:   Weight loss At most recent visit with Dr. Constance Goltz 2 months ago, patient was reporting continued weight loss due to decreased appetite and "food not tasting good".  At that point, did not have any abdominal pain, nausea, vomiting, or diarrhea, night sweats, cough.  Patient was noted to have a similar episode in 2016 which was worked up without any cause and was started on mirtazapine with improvement in her appetite and weight, however, somewhere along the way the mirtazapine was increased and subsequently stopped as it became less effective. She referred to GI at last visit. Reports decreased appetite for about 1 year. Reports feeling more down lately, lives alone and sometimes feels lonely. Reports she was not able to schedule GI appointment until she got the orange card which she got recently. Denies smoking history. Smokeless tobacco abuse listed in problem list. 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 Colonoscopy last done in 2020, normal.  T2DM Currently on metformin and Lantus 7 units daily.  No longer on short acting insulin. Fasting sugars typically in the 100s to 110s. Denies symptomatic hypoglycemia, last episode was several months ago.  Patient recently signed up for the orange card and recently received it.  PERTINENT  PMH / PSH: T2DM  OBJECTIVE:   BP 132/78   Pulse 73   Ht 5\' 4"  (1.626 m)   Wt 151 lb 6.4 oz (68.7 kg)   SpO2 99%   BMI 25.99 kg/m   General: alert, NAD CV: RRR, no murmurs Pulm: CTAB, no wheezes or rales Abdomen: Soft, nontender, positive bowel sounds  Wt Readings from Last 3 Encounters:  06/16/21 151 lb 6.4 oz (68.7 kg)  04/23/21 156 lb 12.8 oz (71.1 kg)  03/29/21 159 lb (72.1 kg)    Diabetic foot exam was performed.  No deformities or other abnormal visual findings.  Posterior tibialis and dorsalis pulse intact bilaterally.  Intact to touch and  monofilament testing bilaterally.    ASSESSMENT/PLAN:   Type 2 diabetes mellitus without complications (HCC) Appears well controlled on current medications based on fasting sugars.  We will recheck A1c at follow-up visit. - amb ref ophtho for diabetic eye exam  Loss of weight Underlying mood disorder possibly contributing.  Continues to lose weight due to decreased appetite, will restart mirtazapine which she has done well with in the past.  GI referral order already placed, patient to contact their office and can place another referral order if needed. - mirtazapine 7.5 mg qhs - therapy resources given   HCM - Covid second booster given today - diabetic foot exam performed - shingles vaccine declined (not covered by 03/31/21) - mammogram ordered  [employer], MD [facility]

## 2021-06-23 ENCOUNTER — Other Ambulatory Visit: Payer: Self-pay

## 2021-06-23 ENCOUNTER — Other Ambulatory Visit: Payer: Self-pay | Admitting: Family Medicine

## 2021-06-23 DIAGNOSIS — Z1231 Encounter for screening mammogram for malignant neoplasm of breast: Secondary | ICD-10-CM

## 2021-06-23 NOTE — Progress Notes (Signed)
z12 

## 2021-06-24 ENCOUNTER — Other Ambulatory Visit: Payer: Self-pay | Admitting: Family Medicine

## 2021-06-25 ENCOUNTER — Other Ambulatory Visit (HOSPITAL_COMMUNITY): Payer: Self-pay

## 2021-06-25 ENCOUNTER — Other Ambulatory Visit: Payer: Self-pay | Admitting: Family Medicine

## 2021-06-25 DIAGNOSIS — S62645A Nondisplaced fracture of proximal phalanx of left ring finger, initial encounter for closed fracture: Secondary | ICD-10-CM

## 2021-06-25 MED ORDER — TRAMADOL HCL 50 MG PO TABS
ORAL_TABLET | Freq: Three times a day (TID) | ORAL | 0 refills | Status: DC | PRN
  Filled 2021-06-25: qty 30, 10d supply, fill #0

## 2021-07-15 ENCOUNTER — Other Ambulatory Visit: Payer: Self-pay | Admitting: Family Medicine

## 2021-07-15 DIAGNOSIS — Z1231 Encounter for screening mammogram for malignant neoplasm of breast: Secondary | ICD-10-CM

## 2021-07-20 ENCOUNTER — Other Ambulatory Visit: Payer: Self-pay | Admitting: Family Medicine

## 2021-07-20 ENCOUNTER — Other Ambulatory Visit (HOSPITAL_COMMUNITY): Payer: Self-pay

## 2021-07-20 DIAGNOSIS — S62645A Nondisplaced fracture of proximal phalanx of left ring finger, initial encounter for closed fracture: Secondary | ICD-10-CM

## 2021-07-20 MED ORDER — TRAMADOL HCL 50 MG PO TABS
ORAL_TABLET | Freq: Three times a day (TID) | ORAL | 0 refills | Status: DC | PRN
Start: 2021-07-20 — End: 2021-08-16
  Filled 2021-07-20: qty 30, 10d supply, fill #0

## 2021-07-21 ENCOUNTER — Other Ambulatory Visit (HOSPITAL_COMMUNITY): Payer: Self-pay

## 2021-08-03 ENCOUNTER — Inpatient Hospital Stay: Admission: RE | Admit: 2021-08-03 | Payer: No Typology Code available for payment source | Source: Ambulatory Visit

## 2021-08-16 ENCOUNTER — Other Ambulatory Visit (HOSPITAL_COMMUNITY): Payer: Self-pay

## 2021-08-16 ENCOUNTER — Other Ambulatory Visit: Payer: Self-pay | Admitting: Family Medicine

## 2021-08-16 DIAGNOSIS — S62645A Nondisplaced fracture of proximal phalanx of left ring finger, initial encounter for closed fracture: Secondary | ICD-10-CM

## 2021-08-16 MED ORDER — TRAMADOL HCL 50 MG PO TABS
ORAL_TABLET | Freq: Three times a day (TID) | ORAL | 0 refills | Status: DC | PRN
  Filled 2021-08-16: qty 30, 10d supply, fill #0

## 2021-08-17 ENCOUNTER — Other Ambulatory Visit (HOSPITAL_COMMUNITY): Payer: Self-pay

## 2021-08-19 ENCOUNTER — Ambulatory Visit
Admission: RE | Admit: 2021-08-19 | Discharge: 2021-08-19 | Disposition: A | Discharge status: Home / Self Care | Payer: No Typology Code available for payment source | Source: Ambulatory Visit | Attending: Family Medicine | Admitting: Family Medicine

## 2021-08-19 ENCOUNTER — Other Ambulatory Visit: Payer: Self-pay

## 2021-08-19 DIAGNOSIS — Z1231 Encounter for screening mammogram for malignant neoplasm of breast: Secondary | ICD-10-CM

## 2021-08-25 ENCOUNTER — Other Ambulatory Visit: Payer: Self-pay | Admitting: Obstetrics and Gynecology

## 2021-08-25 DIAGNOSIS — R928 Other abnormal and inconclusive findings on diagnostic imaging of breast: Secondary | ICD-10-CM

## 2021-08-26 NOTE — Patient Instructions (Addendum)
It was nice seeing you today!  Call eye doctor regarding appointment. [facility] [address]  Laureen Abrahams [place] [address]  Call: [phone]  Fax: [phone]  Follow-up in 3 months for diabetes check.  Please arrive at least 15 minutes prior to your scheduled appointments.  Stay well, Littie Deeds, MD [facility] [phone]

## 2021-08-26 NOTE — Progress Notes (Signed)
    SUBJECTIVE:   CHIEF COMPLAINT / HPI: Follow-up diabetes  T2DM Currently on metformin 1000 mg twice daily and Lantus 7 units daily. Fasting sugars: mostly 100s to 110s, low 88 and high 136 Reports good adherence to medications  Weight loss Ongoing issue with weight loss due to decreased appetite.  She was restarted on mirtazapine at last visit.  She previously been referred to GI but had not been able to go due to insurance reasons but she now has the orange card. Reports mirtazapine has been helping only sometimes. Still not much appetite during the day but seems.  Abnormal mammogram Patient had a screening mammogram done on 10/11 which revealed possible mass in the right breast recommended to have further evaluation with diagnostic mammogram and possibly ultrasound. She is awaiting a call for this to be scheduled.  Dysphagia Reports dysphagia with foods for the past few weeks. No issues with liquids. Feels food gets stuck in her throat.  PERTINENT  PMH / PSH: T2DM, HTN, HLD, weight loss, obesity, smokeless tobacco abuse, TIA  OBJECTIVE:   BP (!) 143/76   Pulse 78   Ht 5\' 4"  (1.626 m)   Wt 158 lb 3.2 oz (71.8 kg)   SpO2 99%   BMI 27.15 kg/m   General: overweight female, NAD CV: RRR, no murmurs Pulm: CTAB, no wheezes or rales  Lab Results  Component Value Date   HGBA1C 8.1 (A) 08/27/2021    Wt Readings from Last 3 Encounters:  08/27/21 158 lb 3.2 oz (71.8 kg)  06/16/21 151 lb 6.4 oz (68.7 kg)  04/23/21 156 lb 12.8 oz (71.1 kg)     ASSESSMENT/PLAN:   Type 2 diabetes mellitus without complications (HCC) A1c worsening 8.1 but fasting sugars appear to be well-controlled. No changes today.  Loss of weight Weight is improved with mirtazapine.  GI appointment scheduled during visit today.  Dysphagia Dysphagia to solids only.  Already referred to GI, has appointment next month.   HCM - flu vaccine given - diabetic eye exam due, referral placed at last visit - has  not received a call from them yet, information given  06/23/21, MD [facility]

## 2021-08-27 ENCOUNTER — Encounter: Payer: Self-pay | Admitting: Family Medicine

## 2021-08-27 ENCOUNTER — Ambulatory Visit (INDEPENDENT_AMBULATORY_CARE_PROVIDER_SITE_OTHER): Payer: Self-pay | Admitting: Family Medicine

## 2021-08-27 ENCOUNTER — Other Ambulatory Visit: Payer: Self-pay

## 2021-08-27 VITALS — BP 143/76 | HR 78 | Ht 64.0 in | Wt 158.2 lb

## 2021-08-27 DIAGNOSIS — I10 Essential (primary) hypertension: Secondary | ICD-10-CM

## 2021-08-27 DIAGNOSIS — R634 Abnormal weight loss: Secondary | ICD-10-CM

## 2021-08-27 DIAGNOSIS — Z794 Long term (current) use of insulin: Secondary | ICD-10-CM

## 2021-08-27 DIAGNOSIS — E119 Type 2 diabetes mellitus without complications: Secondary | ICD-10-CM

## 2021-08-27 DIAGNOSIS — Z23 Encounter for immunization: Secondary | ICD-10-CM

## 2021-08-27 DIAGNOSIS — R1310 Dysphagia, unspecified: Secondary | ICD-10-CM

## 2021-08-27 LAB — POCT GLYCOSYLATED HEMOGLOBIN (HGB A1C): HbA1c, POC (controlled diabetic range): 8.1 % — AB (ref 0.0–7.0)

## 2021-08-27 MED ORDER — METOPROLOL TARTRATE 100 MG PO TABS: 100.0000 mg | ORAL_TABLET | Freq: Two times a day (BID) | ORAL | 1 refills | Status: DC

## 2021-08-27 MED ORDER — MIRTAZAPINE 7.5 MG PO TABS: 7.5000 mg | ORAL_TABLET | Freq: Every day | ORAL | 2 refills | Status: DC

## 2021-08-27 NOTE — Assessment & Plan Note (Signed)
Weight is improved with mirtazapine.  GI appointment scheduled during visit today.

## 2021-08-27 NOTE — Assessment & Plan Note (Signed)
A1c worsening 8.1 but fasting sugars appear to be well-controlled. No changes today.

## 2021-08-27 NOTE — Assessment & Plan Note (Signed)
Dysphagia to solids only.  Already referred to GI, has appointment next month.

## 2021-09-08 ENCOUNTER — Other Ambulatory Visit (HOSPITAL_COMMUNITY): Payer: Self-pay

## 2021-09-08 ENCOUNTER — Other Ambulatory Visit: Payer: Self-pay | Admitting: Family Medicine

## 2021-09-08 DIAGNOSIS — S62645A Nondisplaced fracture of proximal phalanx of left ring finger, initial encounter for closed fracture: Secondary | ICD-10-CM

## 2021-09-08 MED ORDER — TRAMADOL HCL 50 MG PO TABS
ORAL_TABLET | Freq: Three times a day (TID) | ORAL | 0 refills | Status: DC | PRN
Start: 2021-09-08 — End: 2021-10-04
  Filled 2021-09-08: qty 30, 30d supply, fill #0

## 2021-09-14 ENCOUNTER — Ambulatory Visit
Admission: RE | Admit: 2021-09-14 | Discharge: 2021-09-14 | Disposition: A | Discharge status: Home / Self Care | Payer: No Typology Code available for payment source | Source: Ambulatory Visit | Attending: Obstetrics and Gynecology | Admitting: Obstetrics and Gynecology

## 2021-09-14 ENCOUNTER — Other Ambulatory Visit: Payer: Self-pay

## 2021-09-14 ENCOUNTER — Ambulatory Visit: Payer: No Typology Code available for payment source

## 2021-09-14 ENCOUNTER — Ambulatory Visit: Payer: Self-pay | Admitting: *Deleted

## 2021-09-14 VITALS — BP 162/96 | Wt 158.0 lb

## 2021-09-14 DIAGNOSIS — Z1239 Encounter for other screening for malignant neoplasm of breast: Secondary | ICD-10-CM

## 2021-09-14 DIAGNOSIS — R928 Other abnormal and inconclusive findings on diagnostic imaging of breast: Secondary | ICD-10-CM

## 2021-09-14 DIAGNOSIS — Z1211 Encounter for screening for malignant neoplasm of colon: Secondary | ICD-10-CM

## 2021-09-14 NOTE — Patient Instructions (Addendum)
Explained breast self awareness with Ceasar Lund. Patient did not need a Pap smear today due to last Pap smear and HPV typing was 01/08/2019. Let her know BCCCP will cover Pap smears and HPV typing every 5 years unless has a history of abnormal Pap smears. Referred patient to the [facility] for a right breast diagnostic mammogram per recommendation. Appointment scheduled Tuesday, September 14, 2021 at 1010. Patient aware of appointment and will be there. Anissa Abbs verbalized understanding.  Joslyn Ramos, Kathaleen Maser, RN 8:31 AM

## 2021-09-14 NOTE — Progress Notes (Signed)
[name] is a 65 y.o. female who presents to [facility] clinic today with no complaints. Patient referred to [facility] by the [facility] due to recommending additional imaging of the right breast. Screening mammogram completed 08/19/2021.  Pap Smear: Pap smear not completed today. Last Pap smear was 01/08/2019 at [facility] clinic and was normal with negative HPV. Per patient has no history of an abnormal Pap smear. Last Pap smear result is available in Epic.   Physical exam: Breasts Breasts symmetrical. No skin abnormalities bilateral breasts. No nipple retraction bilateral breasts. No nipple discharge bilateral breasts. No lymphadenopathy. No lumps palpated bilateral breasts. No complaints of pain or tenderness on exam.      MS DIGITAL SCREENING BILATERAL  Result Date: 04/04/2017 CLINICAL DATA:  Screening. EXAM: DIGITAL SCREENING BILATERAL MAMMOGRAM WITH CAD COMPARISON:  Previous exam(s). ACR Breast Density Category c: The breast tissue is heterogeneously dense, which may obscure small masses. FINDINGS: There are no findings suspicious for malignancy. Images were processed with CAD. IMPRESSION: No mammographic evidence of malignancy. A result letter of this screening mammogram will be mailed directly to the patient. RECOMMENDATION: Screening mammogram in one year. (Code:SM-B-01Y) BI-RADS CATEGORY  1: Negative. Electronically Signed   By: Gerome Sam III M.D   On: 04/04/2017 17:02   MM 3D SCREEN BREAST BILATERAL  Result Date: 08/24/2021 CLINICAL DATA:  Screening. EXAM: DIGITAL SCREENING BILATERAL MAMMOGRAM WITH TOMOSYNTHESIS AND CAD TECHNIQUE: Bilateral screening digital craniocaudal and mediolateral oblique mammograms were obtained. Bilateral screening digital breast tomosynthesis was performed. The images were evaluated with computer-aided detection. COMPARISON:  Previous exam(s). ACR Breast Density Category c: The breast tissue is heterogeneously dense, which  may obscure small masses. FINDINGS: In the right breast, a possible mass posteriorly in the craniocaudal projection warrants further evaluation. In the left breast, no findings suspicious for malignancy. IMPRESSION: Further evaluation is suggested for a possible mass in the right breast. RECOMMENDATION: Diagnostic mammogram and possibly ultrasound of the right breast. (Code:FI-R-39M) The patient will be contacted regarding the findings, and additional imaging will be scheduled. BI-RADS CATEGORY  0: Incomplete. Need additional imaging evaluation and/or prior mammograms for comparison. Electronically Signed   By: Beckie Salts M.D.   On: 08/24/2021 12:53   MS DIGITAL SCREENING TOMO BILATERAL  Result Date: 06/01/2020 CLINICAL DATA:  Screening. EXAM: DIGITAL SCREENING BILATERAL MAMMOGRAM WITH TOMO AND CAD COMPARISON:  Previous exam(s). ACR Breast Density Category d: The breast tissue is extremely dense, which lowers the sensitivity of mammography FINDINGS: There are no findings suspicious for malignancy. Images were processed with CAD. IMPRESSION: No mammographic evidence of malignancy. A result letter of this screening mammogram will be mailed directly to the patient. RECOMMENDATION: Screening mammogram in one year. (Code:SM-B-01Y) BI-RADS CATEGORY  1: Negative. Electronically Signed   By: Norva Pavlov M.D.   On: 06/01/2020 14:01   MS DIGITAL SCREENING TOMO BILATERAL  Result Date: 05/06/2019 CLINICAL DATA:  Screening. EXAM: DIGITAL SCREENING BILATERAL MAMMOGRAM WITH TOMO AND CAD COMPARISON:  Previous exam(s). ACR Breast Density Category c: The breast tissue is heterogeneously dense, which may obscure small masses. FINDINGS: There are no findings suspicious for malignancy. Images were processed with CAD. IMPRESSION: No mammographic evidence of malignancy. A result letter of this screening mammogram will be mailed directly to the patient. RECOMMENDATION: Screening mammogram in one year. (Code:SM-B-01Y) BI-RADS  CATEGORY  1: Negative. Electronically Signed   By: Amie Portland M.D.   On: 05/06/2019 11:46   MS DIGITAL SCREENING TOMO  BILATERAL  Result Date: 04/13/2018 CLINICAL DATA:  Screening. EXAM: DIGITAL SCREENING BILATERAL MAMMOGRAM WITH TOMO AND CAD COMPARISON:  Previous exam(s). ACR Breast Density Category c: The breast tissue is heterogeneously dense, which may obscure small masses. FINDINGS: There are no findings suspicious for malignancy. Images were processed with CAD. IMPRESSION: No mammographic evidence of malignancy. A result letter of this screening mammogram will be mailed directly to the patient. RECOMMENDATION: Screening mammogram in one year. (Code:SM-B-01Y) BI-RADS CATEGORY  1: Negative. Electronically Signed   By: Amie Portland M.D.   On: 04/13/2018 10:55      Pelvic/Bimanual Pap is not indicated today per BCCCP guidelines.   Smoking History: Patient has never smoked. Patient quit using smokeless tobacco in 2014.   Patient Navigation: Patient education provided. Access to services provided for patient through [facility] program.   Colorectal Cancer Screening: Per patient has had colonoscopy completed on 11/15/2018 at [facility].  No complaints today.    Breast and Cervical Cancer Risk Assessment: Patient does not have family history of breast cancer, known genetic mutations, or radiation treatment to the chest before age 51. Patient does not have history of cervical dysplasia, immunocompromised, or DES exposure in-utero.  Risk Assessment     Risk Scores       09/14/2021   Last edited by: Narda Rutherford, LPN   5-year risk: 4.2 %   Lifetime risk: 15 %            A: BCCCP exam without pap smear No complaints.  P: Referred patient to the [facility] for a right breast diagnostic mammogram per recommendation. Appointment scheduled Tuesday, September 14, 2021 at 1010.  Priscille Heidelberg, RN 09/14/2021 8:31 AM

## 2021-09-17 ENCOUNTER — Other Ambulatory Visit: Payer: Self-pay | Admitting: Family Medicine

## 2021-09-17 ENCOUNTER — Telehealth: Payer: Self-pay

## 2021-09-17 MED ORDER — PREMARIN 0.625 MG/GM VA CREA: TOPICAL_CREAM | VAGINAL | 12 refills | Status: DC

## 2021-09-17 NOTE — Telephone Encounter (Signed)
Please have her try the vaginal cream and use a OTC vaginal moisturizer. I have placed the order for the cream. Have her follow-up with me in about 4 weeks and we can discuss next steps if the cream is not helping. The cream is safer than the pills so I'd like her to try the cream again.

## 2021-09-17 NOTE — Telephone Encounter (Signed)
Patient calls nurse line requesting refill on Premarin tablets. Patient reports that she was originally prescribed this in 2020 when she was seeing Dr. Parke Simmers. Patient reports that cream does not work and she will need oral medication. Patient reports that she has been having issues with vaginal dryness again and would like to restart the medication.   Please advise.   Veronda Prude, RN

## 2021-09-20 ENCOUNTER — Other Ambulatory Visit: Payer: Self-pay | Admitting: Family Medicine

## 2021-09-20 DIAGNOSIS — I10 Essential (primary) hypertension: Secondary | ICD-10-CM

## 2021-09-20 NOTE — Telephone Encounter (Signed)
Patient returns call to nurse line. Informed of below.   Persephanie Laatsch C Keyunna Coco, RN  

## 2021-09-24 ENCOUNTER — Telehealth: Payer: Self-pay

## 2021-09-24 NOTE — Telephone Encounter (Signed)
Patient calls nurse line reporting abdominal cramping after using Premarin Cream. Patient reports the pain is 8/10 and reminds her "period cramps." Patient denies any vaginal bleeding, discharge or foul odor. Patient reports she has been using "extra" Tramadol to help with pain. Patient advised she may need an apt for evaluation, however will forward to the provider.

## 2021-09-25 NOTE — Telephone Encounter (Signed)
Abdominal cramping is a known side effect of Premarin cream. A lower dose NSAID may be more effective if that is the case. If pain is intolerable, I would have her decrease the frequency of the Premarin to every other day. If this is not helping there or there are other concerns, I would recommend sooner evaluation.

## 2021-09-26 LAB — FECAL OCCULT BLOOD, IMMUNOCHEMICAL: Fecal Occult Bld: NEGATIVE

## 2021-09-27 ENCOUNTER — Telehealth: Payer: Self-pay

## 2021-09-27 NOTE — Telephone Encounter (Signed)
Patient informed negative FIT test results. Patient verbalized understanding.  

## 2021-09-27 NOTE — Telephone Encounter (Signed)
The patient has been updated on PCP recommendation. Patient will decrease to using every other day.

## 2021-09-29 ENCOUNTER — Ambulatory Visit (INDEPENDENT_AMBULATORY_CARE_PROVIDER_SITE_OTHER): Payer: Self-pay | Admitting: Internal Medicine

## 2021-09-29 ENCOUNTER — Encounter: Payer: Self-pay | Admitting: Internal Medicine

## 2021-09-29 VITALS — BP 160/80 | HR 67 | Ht 64.0 in | Wt 164.4 lb

## 2021-09-29 DIAGNOSIS — R634 Abnormal weight loss: Secondary | ICD-10-CM

## 2021-09-29 DIAGNOSIS — R1310 Dysphagia, unspecified: Secondary | ICD-10-CM

## 2021-09-29 NOTE — Progress Notes (Signed)
HISTORY OF PRESENT ILLNESS:  [name] is a 65 y.o. female with past medical history as listed below.  She presents to the clinic today regarding problems with decreased appetite, weight loss, and dysphagia.  I saw the patient in the endoscopy center January 2020 regarding routine screening colonoscopy.  The examination revealed right-sided diverticulosis but was otherwise normal.  Follow-up in 10 years recommended.  She also underwent remote upper endoscopy in February 2008 to evaluate anemia.  The examination was normal.  Patient tells me that for about 6 to 8 months she has had decreased appetite with decreased p.o. intake.  She describes food as unappealing.  She reports that her weight had dropped from about 182 pounds down to 147 pounds.  About 2 months ago her PCP placed her on Remeron.  Since that time she reports improved appetite and 15 pound weight gain.  She is diabetic and takes insulin in the morning as well as metformin twice daily.  She denies vomiting.  She does tell me that she has experienced some vague dysphagia over the past several weeks.  She notices that with liquids and solids at times feel like there passing through slowly.  She did undergo gastric emptying study in February 2016 to evaluate weight loss and abdominal fullness.  The examination was normal.  Review of blood work from May 2022 shows anemia with a hemoglobin 10.3.  Comprehensive metabolic panel is remarkable for mild renal insufficiency.  Last hemoglobin A1c last month was 8.1.  Most recent stool Hemoccult testing was negative.  REVIEW OF SYSTEMS:  All non-GI ROS negative unless otherwise stated in the HPI except for muscle cramps, sleeping problems  Past Medical History:  Diagnosis Date   Anemia    Benign heart murmur    Closed nondisplaced fracture of proximal phalanx of left ring finger 04/08/2020   Diabetes mellitus    Gastric ulcer    History of colonoscopy    Hyperlipidemia    Hypertension     Stroke (HCC)    2008 - right arm/leg numbness.       Past Surgical History:  Procedure Laterality Date   BREAST CYST EXCISION Bilateral    BTL  1988   COLONOSCOPY  01/13/2004   Colonoscopy - normal, diverticulosis    ENDOMETRIAL BIOPSY     Hx of endometrial Bx 2004 - negative for hyperplasia or malignancy. Patient does not remember reason for testing.   ESOPHAGOGASTRODUODENOSCOPY     normal   L and R breast lumpectomies benign  1970    Social History Lanetta Figuero  reports that she has never smoked. She quit smokeless tobacco use about 8 years ago.  Her smokeless tobacco use included snuff. She reports that she does not currently use alcohol. She reports that she does not use drugs.  family history includes Alcohol abuse in her father; Diabetes in her mother and sister; Heart disease in her father.  Allergies  Allergen Reactions   Amlodipine     Leg swelling   Hctz [Hydrochlorothiazide] Other (See Comments)    Gout flare       PHYSICAL EXAMINATION: Vital signs: BP (!) 160/80   Pulse 67   Ht 5\' 4"  (1.626 m)   Wt 164 lb 6 oz (74.6 kg)   BMI 28.21 kg/m   Constitutional: generally well-appearing, no acute distress Psychiatric: alert and oriented x3, cooperative Eyes: extraocular movements intact, anicteric, conjunctiva pink Mouth: oral pharynx moist, no lesions Neck: supple no lymphadenopathy Cardiovascular: heart  regular rate and rhythm, no murmur Lungs: clear to auscultation bilaterally Abdomen: soft, nontender, nondistended, no obvious ascites, no peritoneal signs, normal bowel sounds, no organomegaly Rectal: Omitted Extremities: no clubbing, cyanosis, or lower extremity edema bilaterally Skin: no lesions on visible extremities Neuro: No focal deficits.  Cranial nerves intact  ASSESSMENT:  1.  Weight loss.  It appears that her weight loss was directly attributable to decreased appetite.  Improved after initiation of Remeron 2.  Vague dysphagia.  Needs  evaluated. 3.  Colonoscopy January 2020 without neoplasia 4.  General medical problems including insulin requiring diabetes   PLAN:  1.  Schedule upper endoscopy with possible esophageal dilation.  The patient is high risk given her diabetes.The nature of the procedure, as well as the risks, benefits, and alternatives were carefully and thoroughly reviewed with the patient. Ample time for discussion and questions allowed. The patient understood, was satisfied, and agreed to proceed.  2.  Told to decrease her morning insulin from 7 units to 5 units the day of the procedure.  Also hold metformin the morning of her procedure to avoid unwanted hypoglycemia 3.  Repeat screening colonoscopy around January 2030 4.  Ongoing general medical care with Dr. Wynelle Link

## 2021-09-29 NOTE — Telephone Encounter (Signed)
Patient calls nurse line reporting continued discomfort.   Patient scheduled for 11/22.

## 2021-09-29 NOTE — Patient Instructions (Signed)
If you are age 64 or older, your body mass index should be between 23-30. Your Body mass index is 28.21 kg/m. If this is out of the aforementioned range listed, please consider follow up with your Primary Care Provider.  If you are age 80 or younger, your body mass index should be between 19-25. Your Body mass index is 28.21 kg/m. If this is out of the aformentioned range listed, please consider follow up with your Primary Care Provider.   ________________________________________________________  The [facility] providers would like to encourage you to use [facility] to communicate with providers for non-urgent requests or questions.  Due to long hold times on the telephone, sending your provider a message by [name] may be a faster and more efficient way to get a response.  Please allow 48 business hours for a response.  Please remember that this is for non-urgent requests.  _______________________________________________________ [name] have been scheduled for an endoscopy. Please follow written instructions given to you at your visit today. If you use inhalers (even only as needed), please bring them with you on the day of your procedure.

## 2021-10-01 ENCOUNTER — Encounter: Payer: Self-pay | Admitting: Internal Medicine

## 2021-10-01 ENCOUNTER — Other Ambulatory Visit: Payer: Self-pay

## 2021-10-01 ENCOUNTER — Ambulatory Visit (AMBULATORY_SURGERY_CENTER): Payer: Self-pay | Admitting: Internal Medicine

## 2021-10-01 VITALS — BP 133/61 | HR 64 | Temp 96.8°F | Resp 19 | Ht 64.0 in | Wt 164.0 lb

## 2021-10-01 DIAGNOSIS — R634 Abnormal weight loss: Secondary | ICD-10-CM

## 2021-10-01 DIAGNOSIS — B9681 Helicobacter pylori [H. pylori] as the cause of diseases classified elsewhere: Secondary | ICD-10-CM

## 2021-10-01 DIAGNOSIS — R1310 Dysphagia, unspecified: Secondary | ICD-10-CM

## 2021-10-01 DIAGNOSIS — K253 Acute gastric ulcer without hemorrhage or perforation: Secondary | ICD-10-CM

## 2021-10-01 DIAGNOSIS — K2950 Unspecified chronic gastritis without bleeding: Secondary | ICD-10-CM

## 2021-10-01 DIAGNOSIS — K2970 Gastritis, unspecified, without bleeding: Secondary | ICD-10-CM

## 2021-10-01 MED ORDER — SODIUM CHLORIDE 0.9 % IV SOLN: 500.0000 mL | Freq: Once | INTRAVENOUS | Status: DC

## 2021-10-01 MED ORDER — DEXTROSE 50 % IV SOLN
25.0000 mL | Freq: Once | INTRAVENOUS | Status: AC
  Administered 2021-10-01: 25 mL via INTRAVENOUS

## 2021-10-01 MED ORDER — PANTOPRAZOLE SODIUM 40 MG PO TBEC: 40.0000 mg | DELAYED_RELEASE_TABLET | Freq: Every day | ORAL | 3 refills | Status: DC

## 2021-10-01 NOTE — Op Note (Signed)
[facility] Patient Name: [name] Procedure Date: 10/01/2021 2:11 PM MRN: [id number] Endoscopist: Wilhemina Bonito. Marina Goodell , MD Age: 65 Referring MD:  Date of Birth: 11-28-1955 Gender: Female Account #: [id number] Procedure:                Upper GI endoscopy with biopsies Indications:              Vague dysphagia, Weight loss Medicines:                Monitored Anesthesia Care Procedure:                Pre-Anesthesia Assessment:                           - Prior to the procedure, a History and Physical                            was performed, and patient medications and                            allergies were reviewed. The patient's tolerance of                            previous anesthesia was also reviewed. The risks                            and benefits of the procedure and the sedation                            options and risks were discussed with the patient.                            All questions were answered, and informed consent                            was obtained. Prior Anticoagulants: The patient has                            taken no previous anticoagulant or antiplatelet                            agents. ASA Grade Assessment: II - A patient with                            mild systemic disease. After reviewing the risks                            and benefits, the patient was deemed in                            satisfactory condition to undergo the procedure.                           After obtaining informed consent, the endoscope was  passed under direct vision. Throughout the                            procedure, the patient's blood pressure, pulse, and                            oxygen saturations were monitored continuously. The                            Endoscope was introduced through the mouth, and                            advanced to the second part of duodenum. The upper                            GI endoscopy  was accomplished without difficulty.                            The patient tolerated the procedure well. Scope In: Scope Out: Findings:                 The esophagus was normal.                           The stomach revealed multiple antral erosions and a                            small prepyloric ulcer. Biopsies were taken with a                            cold forceps for histology. Stomach was otherwise                            normal.                           The examined duodenum was normal.                           The cardia and gastric fundus were normal on                            retroflexion. Complications:            No immediate complications. Estimated Blood Loss:     Estimated blood loss: none. Impression:               1. Normal esophagus                           2. Antral erosions and small ulcer in the gastric                            antrum                           3. Otherwise normal EGD. Recommendation:           -  Patient has a contact number available for                            emergencies. The signs and symptoms of potential                            delayed complications were discussed with the                            patient. Return to normal activities tomorrow.                            Written discharge instructions were provided to the                            patient.                           - Resume previous diet.                           - Continue present medications.                           - Await pathology results.                           - Prescribe pantoprazole 40 mg daily; #30; 11                            refills. Take this medicine daily. This will help                            clear of the inflammation of your stomach. Also,                            reduce the risk of developing ulcers in the future.                           - Return to the care of your primary provider Wilhemina Bonito. Marina Goodell, MD 10/01/2021 2:30:02  PM This report has been signed electronically.

## 2021-10-01 NOTE — Progress Notes (Signed)
GI PREPROCEDURE H&P  Patient was seen in the office 2 days ago regarding weight loss and vague dysphagia.  See that dictation.  No interval changes.  ASSESSMENT:   1.  Weight loss.  It appears that her weight loss was directly attributable to decreased appetite.  Improved after initiation of Remeron 2.  Vague dysphagia.  Needs evaluated. 3.  Colonoscopy January 2020 without neoplasia 4.  General medical problems including insulin requiring diabetes     PLAN:   1.  Schedule upper endoscopy with possible esophageal dilation.  The patient is high risk given her diabetes.The nature of the procedure, as well as the risks, benefits, and alternatives were carefully and thoroughly reviewed with the patient. Ample time for discussion and questions allowed. The patient understood, was satisfied, and agreed to proceed.  2.  Told to decrease her morning insulin from 7 units to 5 units the day of the procedure.  Also hold metformin the morning of her procedure to avoid unwanted hypoglycemia 3.  Repeat screening colonoscopy around January 2030 4.  Ongoing general medical care with Dr. Wynelle Link

## 2021-10-01 NOTE — Progress Notes (Signed)
Report to PACU, RN, vss, BBS= Clear.  

## 2021-10-01 NOTE — Patient Instructions (Signed)
Impression/Recommendations:  Resume previous diet. Continue present medications. Await pathology results.  Pantoprazole 40 mg. Daily.    Return to the care of your primary provider.  YOU HAD AN ENDOSCOPIC PROCEDURE TODAY AT THE [facility]:   Refer to the procedure report that was given to you for any specific questions about what was found during the examination.  If the procedure report does not answer your questions, please call your gastroenterologist to clarify.  If you requested that your care partner not be given the details of your procedure findings, then the procedure report has been included in a sealed envelope for you to review at your convenience later.  YOU SHOULD EXPECT: Some feelings of bloating in the abdomen. Passage of more gas than usual.  Walking can help get rid of the air that was put into your GI tract during the procedure and reduce the bloating. If you had a lower endoscopy (such as a colonoscopy or flexible sigmoidoscopy) you may notice spotting of blood in your stool or on the toilet paper. If you underwent a bowel prep for your procedure, you may not have a normal bowel movement for a few days.  Please Note:  You might notice some irritation and congestion in your nose or some drainage.  This is from the oxygen used during your procedure.  There is no need for concern and it should clear up in a day or so.  SYMPTOMS TO REPORT IMMEDIATELY:   Following upper endoscopy (EGD)  Vomiting of blood or coffee ground material  New chest pain or pain under the shoulder blades  Painful or persistently difficult swallowing  New shortness of breath  Fever of 100F or higher  Black, tarry-looking stools  For urgent or emergent issues, a gastroenterologist can be reached at any hour by calling [phone]. Do not use MyChart messaging for urgent concerns.    DIET:  We do recommend a small meal at first, but then you may proceed to your regular diet.  Drink  plenty of fluids but you should avoid alcoholic beverages for 24 hours.  ACTIVITY:  You should plan to take it easy for the rest of today and you should NOT DRIVE or use heavy machinery until tomorrow (because of the sedation medicines used during the test).    FOLLOW UP: Our staff will call the number listed on your records 48-72 hours following your procedure to check on you and address any questions or concerns that you may have regarding the information given to you following your procedure. If we do not reach you, we will leave a message.  We will attempt to reach you two times.  During this call, we will ask if you have developed any symptoms of COVID 19. If you develop any symptoms (ie: fever, flu-like symptoms, shortness of breath, cough etc.) before then, please call [phone].  If you test positive for Covid 19 in the 2 weeks post procedure, please call and report this information to [place].    If any biopsies were taken you will be contacted by phone or by letter within the next 1-3 weeks.  Please call us at [phone] if you have not heard about the biopsies in 3 weeks.    SIGNATURES/CONFIDENTIALITY: You and/or your care partner have signed paperwork which will be entered into your electronic medical record.  These signatures attest to the fact that that the information above on your After Visit Summary has been reviewed and is understood.  Full  responsibility of the confidentiality of this discharge information lies with you and/or your care-partner.  

## 2021-10-01 NOTE — Progress Notes (Signed)
Called to room to assist during endoscopic procedure.  Patient ID and intended procedure confirmed with present staff. Received instructions for my participation in the procedure from the performing physician.  

## 2021-10-04 ENCOUNTER — Other Ambulatory Visit: Payer: Self-pay | Admitting: Family Medicine

## 2021-10-04 ENCOUNTER — Other Ambulatory Visit (HOSPITAL_COMMUNITY): Payer: Self-pay

## 2021-10-04 DIAGNOSIS — S62645A Nondisplaced fracture of proximal phalanx of left ring finger, initial encounter for closed fracture: Secondary | ICD-10-CM

## 2021-10-04 MED ORDER — TRAMADOL HCL 50 MG PO TABS
ORAL_TABLET | Freq: Three times a day (TID) | ORAL | 0 refills | Status: DC | PRN
  Filled 2021-10-04: qty 30, 10d supply, fill #0

## 2021-10-05 ENCOUNTER — Telehealth: Payer: Self-pay

## 2021-10-05 ENCOUNTER — Ambulatory Visit: Payer: No Typology Code available for payment source | Admitting: Family Medicine

## 2021-10-05 NOTE — Telephone Encounter (Signed)
  Follow up Call-  Call back number 10/01/2021  Post procedure Call Back phone  # [phone]  Permission to leave phone message Yes  Some recent data might be hidden     Patient questions:  Do you have a fever, pain , or abdominal swelling? No. Pain Score  0 *  Have you tolerated food without any problems? Yes.    Have you been able to return to your normal activities? Yes.    Do you have any questions about your discharge instructions: Diet   No. Medications  No. Follow up visit  No.  Do you have questions or concerns about your Care? No.  Actions: * If pain score is 4 or above: No action needed, pain <4.  Have you developed a fever since your procedure? no  2.   Have you had an respiratory symptoms (SOB or cough) since your procedure? no  3.   Have you tested positive for COVID 19 since your procedure no  4.   Have you had any family members/close contacts diagnosed with the COVID 19 since your procedure?  no   If yes to any of these questions please route to Laverna Peace, RN and Karlton Lemon, RN

## 2021-10-11 ENCOUNTER — Telehealth: Payer: Self-pay | Admitting: Internal Medicine

## 2021-10-11 ENCOUNTER — Ambulatory Visit (INDEPENDENT_AMBULATORY_CARE_PROVIDER_SITE_OTHER): Payer: Self-pay | Admitting: Family Medicine

## 2021-10-11 ENCOUNTER — Ambulatory Visit (INDEPENDENT_AMBULATORY_CARE_PROVIDER_SITE_OTHER): Payer: Self-pay

## 2021-10-11 ENCOUNTER — Other Ambulatory Visit: Payer: Self-pay

## 2021-10-11 VITALS — BP 143/80 | HR 76 | Wt 161.2 lb

## 2021-10-11 DIAGNOSIS — Z23 Encounter for immunization: Secondary | ICD-10-CM

## 2021-10-11 DIAGNOSIS — N958 Other specified menopausal and perimenopausal disorders: Secondary | ICD-10-CM

## 2021-10-11 MED ORDER — BISMUTH SUBSALICYLATE 262 MG PO TABS: 2.0000 | ORAL_TABLET | Freq: Four times a day (QID) | ORAL | 0 refills | Status: AC

## 2021-10-11 MED ORDER — METRONIDAZOLE 500 MG PO TABS: 500.0000 mg | ORAL_TABLET | Freq: Two times a day (BID) | ORAL | 0 refills | Status: AC

## 2021-10-11 MED ORDER — PANTOPRAZOLE SODIUM 40 MG PO TBEC: 40.0000 mg | DELAYED_RELEASE_TABLET | Freq: Two times a day (BID) | ORAL | 0 refills | Status: DC

## 2021-10-11 MED ORDER — ESTRADIOL 10 MCG VA TABS: ORAL_TABLET | VAGINAL | 0 refills | Status: DC

## 2021-10-11 MED ORDER — AMOXICILLIN 500 MG PO TABS: 1000.0000 mg | ORAL_TABLET | Freq: Two times a day (BID) | ORAL | 0 refills | Status: AC

## 2021-10-11 NOTE — Telephone Encounter (Signed)
[facility] called and stated that the Flagyl tablet that was ordered, they only have in capsules. Seeking advise if they can proscribe that for the patient. Please advise.

## 2021-10-11 NOTE — Telephone Encounter (Signed)
The pharmacy has been advised that the capsule form is ok to use instead of tablet.

## 2021-10-11 NOTE — Progress Notes (Signed)
    SUBJECTIVE:   CHIEF COMPLAINT / HPI: vaginal dryness and itching  Patient reports vulvar itching.  Seen by PCP who prescribed Premarin vaginal cream.  She reports that has not helped and itching continues. Itching localized to outer labial folds.  Denies any vaginal discharge, vaginal bleeding, dysuria or recent sexual intercourse.  Reports similar symptoms in past and was taking oral Premarin which seemed to helped.  Stopped medication as symptoms resolved.  Denies any tobacco use, cardiac disease, breast, vaginal or colon cancers or family history of same.  Last PAP 2020 NILM, HPV neg  PERTINENT  PMH / PSH:  Postmenopausal  OBJECTIVE:   BP (!) 143/80   Pulse 76   Wt 161 lb 4 oz (73.1 kg)   SpO2 99%   BMI 27.68 kg/m    General: Alert, no acute distress Pelvic Exam chaperoned by CMA Tashira        External: normal female genitalia without lesions or masses        Vagina: normal without lesions or masses             ASSESSMENT/PLAN:   Genitourinary syndrome of menopause Vulvar dryness and itching. No lichenification, scaling noted on exam given covered in ointment.  Low suspicion for vaginal cancer given no risk factors. -Stop Premarin cream -Trial Vagifem q weeky x 2 weeks then twice weekly.  8 tabs provided.   -Will need to follow up with PCP for effectiveness of treatment.  Consider Gyne referral if symptoms do not improve    Dana Allan, MD [facility]

## 2021-10-11 NOTE — Patient Instructions (Signed)
Thank you for coming to see me today. It was a pleasure. Today we talked about:   Stop premarin cream  Start Premarin vaginal insert tablets.  Insert into vagina once a week for the first two weeks then twice a week on the third week  Follow up with PCP in 2 weeks   If you have any questions or concerns, please do not hesitate to call the office at [phone].  Best,   Dana Allan, MD    Atrophic Vaginitis Atrophic vaginitis is when the lining of the vagina becomes dry and thin. This is most common in women who have stopped having their periods (are in menopause). It usually starts when a woman is 25 to 64 years old. What are the causes? This condition is caused by a drop in a female hormone (estrogen). What increases the risk? You are more likely to develop this condition if: You take certain medicines. You have had your ovaries taken out. You are being treated for cancer. You have given birth or are breastfeeding. You are more than 64 years old. You smoke. What are the signs or symptoms? Pain during sex. A feeling of pressure during sex. Bleeding during sex. Burning or itching in the vagina. Burning pain when you pee (urinate). Fluid coming from your vagina. Some people do not have symptoms. How is this treated? Using a lubricant before sex. Using a moisturizer in the vagina. Using estrogen in the vagina. In some cases, you may not need treatment. Follow these instructions at home: Medicines Take all medicines only as told by your doctor. This includes medicines for dryness. Do not use herbal medicines unless your doctor says it is okay. General instructions Talk with your doctor about treatment. Do not douche. Do not use scented: Sprays. Tampons. Soaps. If sex hurts, try using lubricants right before you have sex. Contact a doctor if: You have fluid coming from the vagina that is not like normal. You have a bad smell coming from your vagina. You have new  symptoms. Your symptoms do not get better when treated. Your symptoms get worse. Summary This condition happens when the lining of the vagina becomes dry and thin. It is most common in women who no longer have periods. Treatment may include using medicines for dryness. Call a doctor if your symptoms do not get better. This information is not intended to replace advice given to you by your health care provider. Make sure you discuss any questions you have with your health care provider. Document Revised: 04/30/2020 Document Reviewed: 04/30/2020 Elsevier Patient Education  2022 [employer].

## 2021-10-12 ENCOUNTER — Encounter: Payer: Self-pay | Admitting: Family Medicine

## 2021-10-12 DIAGNOSIS — N958 Other specified menopausal and perimenopausal disorders: Secondary | ICD-10-CM | POA: Insufficient documentation

## 2021-10-12 NOTE — Assessment & Plan Note (Signed)
Vulvar dryness and itching. No lichenification, scaling noted on exam given covered in ointment.  Low suspicion for vaginal cancer given no risk factors. -Stop Premarin cream -Trial Vagifem q weeky x 2 weeks then twice weekly.  8 tabs provided.   -Will need to follow up with PCP for effectiveness of treatment.  Consider Gyne referral if symptoms do not improve

## 2021-10-13 ENCOUNTER — Telehealth: Payer: Self-pay | Admitting: Internal Medicine

## 2021-10-13 ENCOUNTER — Other Ambulatory Visit: Payer: Self-pay

## 2021-10-13 MED ORDER — ESTRADIOL 10 MCG VA TABS
ORAL_TABLET | VAGINAL | 0 refills | Status: DC
Start: 2021-10-13 — End: 2022-07-15

## 2021-10-13 NOTE — Telephone Encounter (Signed)
Patient calls nurse line regarding transferring estradiol prescription. The [facility] pharmacy will not have stock of prescription for one month. Patient is requesting that rx be sent to [facility] [address].   Called and canceled at [facility] pharmacy. Resending to [facility], per patient request.   Veronda Prude, RN

## 2021-10-14 MED ORDER — PANTOPRAZOLE SODIUM 40 MG PO TBEC: 40.0000 mg | DELAYED_RELEASE_TABLET | Freq: Every day | ORAL | 3 refills | Status: DC

## 2021-10-14 NOTE — Telephone Encounter (Signed)
Explained to patient she was given medication to treat H Pylori and reviewed each one and how to take it.   Also sent in a rx for a year of daily Pantoprazole to start after treatment.  Patient agreed.

## 2021-10-19 ENCOUNTER — Other Ambulatory Visit: Payer: Self-pay | Admitting: Family Medicine

## 2021-10-25 NOTE — Patient Instructions (Addendum)
It was nice seeing you today!  Try to wean off the vaginal estrogen in the next few months if you are able to.  Wean off the tramadol if you are able to.  Get a blood pressure cuff and measure your blood pressure at least a few times a week.  Increase your spironolactone to 50 mg.  Follow-up in 2 weeks for blood pressure check.  Please arrive at least 15 minutes prior to your scheduled appointments.  Stay well, Monica Deeds, MD [facility] [phone]

## 2021-10-25 NOTE — Progress Notes (Signed)
    SUBJECTIVE:   CHIEF COMPLAINT / HPI:   Last visit two weeks ago reported no relief with Premarin vaginal cream, switched to Vagifem (estradiol) 10 mcg daily x 2 weeks then twice weekly.  Patient states that her symptoms are significantly improved since switching to the Vagifem.  She has not been measuring her blood pressure at home as she does not have a BP cuff.  Reports good adherence to her medications.  She has been using the tramadol only as needed for pain, about 3 times per week.  PERTINENT  PMH / PSH: HTN, genitourinary syndrome of menopause, T2DM  OBJECTIVE:   BP (!) 175/77   Pulse 78   Ht 5\' 4"  (1.626 m)   Wt 161 lb 3.2 oz (73.1 kg)   SpO2 100%   BMI 27.67 kg/m   General: Alert, NAD CV: RRR, no murmurs Pulm: CTAB, no wheezes or rales  Wt Readings from Last 3 Encounters:  10/27/21 161 lb 3.2 oz (73.1 kg)  10/11/21 161 lb 4 oz (73.1 kg)  10/01/21 164 lb (74.4 kg)     ASSESSMENT/PLAN:   Genitourinary syndrome of menopause Improved with Vagifem.  Risk of breast cancer and uterine cancer discussed with patient.  Advised to wean off in the next few months if able to.  Chronic pain Has had chronic pain in her knees and lower back.  Refilled tramadol today.  Discussed risks of long-term use.  Patient states she will work on weaning off altogether and thinks she will not need any more after this refill.  HYPERTENSION, BENIGN SYSTEMIC Uncontrolled.  Will increase spironolactone.  Continue metoprolol titrate and lisinopril.  Check BMP today.  Advised to obtain BP monitor and track BP at home.  Plan for follow-up in 2 weeks for BP recheck and labs.  Could consider switching metoprolol to carvedilol in the future.  Hyperlipidemia associated with type 2 diabetes mellitus Repeat lipid panel today     10/03/21, MD [facility]

## 2021-10-27 ENCOUNTER — Other Ambulatory Visit: Payer: Self-pay

## 2021-10-27 ENCOUNTER — Other Ambulatory Visit (HOSPITAL_COMMUNITY): Payer: Self-pay

## 2021-10-27 ENCOUNTER — Ambulatory Visit (INDEPENDENT_AMBULATORY_CARE_PROVIDER_SITE_OTHER): Payer: Self-pay | Admitting: Family Medicine

## 2021-10-27 ENCOUNTER — Encounter: Payer: Self-pay | Admitting: Family Medicine

## 2021-10-27 VITALS — BP 175/77 | HR 78 | Ht 64.0 in | Wt 161.2 lb

## 2021-10-27 DIAGNOSIS — N958 Other specified menopausal and perimenopausal disorders: Secondary | ICD-10-CM

## 2021-10-27 DIAGNOSIS — Z23 Encounter for immunization: Secondary | ICD-10-CM

## 2021-10-27 DIAGNOSIS — E785 Hyperlipidemia, unspecified: Secondary | ICD-10-CM

## 2021-10-27 DIAGNOSIS — I10 Essential (primary) hypertension: Secondary | ICD-10-CM

## 2021-10-27 DIAGNOSIS — S62645A Nondisplaced fracture of proximal phalanx of left ring finger, initial encounter for closed fracture: Secondary | ICD-10-CM

## 2021-10-27 DIAGNOSIS — E1169 Type 2 diabetes mellitus with other specified complication: Secondary | ICD-10-CM

## 2021-10-27 DIAGNOSIS — G8929 Other chronic pain: Secondary | ICD-10-CM

## 2021-10-27 MED ORDER — SPIRONOLACTONE 50 MG PO TABS
50.0000 mg | ORAL_TABLET | Freq: Every day | ORAL | 1 refills | Status: DC
Start: 2021-10-27 — End: 2021-10-28

## 2021-10-27 MED ORDER — TRAMADOL HCL 50 MG PO TABS
ORAL_TABLET | Freq: Three times a day (TID) | ORAL | 0 refills | Status: DC | PRN
  Filled 2021-10-27: qty 30, 10d supply, fill #0

## 2021-10-27 MED ORDER — ATORVASTATIN CALCIUM 40 MG PO TABS
40.0000 mg | ORAL_TABLET | Freq: Every day | ORAL | 3 refills | Status: DC
Start: 2021-10-27 — End: 2021-12-27

## 2021-10-27 NOTE — Assessment & Plan Note (Signed)
Repeat lipid panel today. 

## 2021-10-27 NOTE — Assessment & Plan Note (Signed)
Improved with Vagifem.  Risk of breast cancer and uterine cancer discussed with patient.  Advised to wean off in the next few months if able to.

## 2021-10-27 NOTE — Assessment & Plan Note (Addendum)
Has had chronic pain in her knees and lower back.  Refilled tramadol today.  Discussed risks of long-term use.  Patient states she will work on weaning off altogether and thinks she will not need any more after this refill.

## 2021-10-27 NOTE — Assessment & Plan Note (Addendum)
Uncontrolled.  Will increase spironolactone.  Continue metoprolol titrate and lisinopril.  Check BMP today.  Advised to obtain BP monitor and track BP at home.  Plan for follow-up in 2 weeks for BP recheck and labs.  Could consider switching metoprolol to carvedilol in the future.

## 2021-10-28 ENCOUNTER — Telehealth: Payer: Self-pay | Admitting: Family Medicine

## 2021-10-28 LAB — BASIC METABOLIC PANEL
BUN/Creatinine Ratio: 11 — ABNORMAL LOW (ref 12–28)
BUN: 11 mg/dL (ref 8–27)
CO2: 17 mmol/L — ABNORMAL LOW (ref 20–29)
Calcium: 9.4 mg/dL (ref 8.7–10.3)
Chloride: 100 mmol/L (ref 96–106)
Creatinine, Ser: 1.03 mg/dL — ABNORMAL HIGH (ref 0.57–1.00)
Glucose: 368 mg/dL — ABNORMAL HIGH (ref 70–99)
Potassium: 5.5 mmol/L — ABNORMAL HIGH (ref 3.5–5.2)
Sodium: 137 mmol/L (ref 134–144)
eGFR: 61 mL/min/{1.73_m2} (ref >59)

## 2021-10-28 LAB — LIPID PANEL
Chol/HDL Ratio: 2.2 ratio (ref 0.0–4.4)
Cholesterol, Total: 114 mg/dL (ref 100–199)
HDL: 53 mg/dL (ref >39)
LDL Chol Calc (NIH): 44 mg/dL (ref 0–99)
Triglycerides: 84 mg/dL (ref 0–149)
VLDL Cholesterol Cal: 17 mg/dL (ref 5–40)

## 2021-10-28 MED ORDER — CARVEDILOL 25 MG PO TABS: 25.0000 mg | ORAL_TABLET | Freq: Two times a day (BID) | ORAL | 2 refills | Status: DC

## 2021-10-28 NOTE — Telephone Encounter (Signed)
Called patient to discuss labs. Lipid panel looks good.  Potassium slightly elevated 5.5.  Instructed to stop spironolactone.  Also stop metoprolol tartrate 100 mg twice daily, will switch to carvedilol 25 mg twice daily.  Has follow-up in 2 weeks, will recheck BP and labs then.  At follow-up, consider switching lisinopril 40 mg to losartan 100 mg (extra benefit of uric acid lowering).  If continues to be uncontrolled, clonidine patch could also be a consideration.  Discussed with [facility]

## 2021-11-11 ENCOUNTER — Other Ambulatory Visit: Payer: Self-pay | Admitting: Family Medicine

## 2021-11-11 NOTE — Progress Notes (Deleted)
° ° °  SUBJECTIVE:   CHIEF COMPLAINT / HPI:   See phone note. Meds adjusted given mild hyperkalemia.  PERTINENT  PMH / PSH: ***  OBJECTIVE:   There were no vitals taken for this visit.  General: ***, NAD CV: RRR, no murmurs*** Pulm: CTAB, no wheezes or rales  ASSESSMENT/PLAN:   No problem-specific Assessment & Plan notes found for this encounter.     Littie Deeds, MD [facility]   {    This will disappear when note is signed, click to select method of visit    :1}

## 2021-11-11 NOTE — Patient Instructions (Incomplete)
It was nice seeing you today! ° ° ° °Please arrive at least 15 minutes prior to your scheduled appointments. ° °Stay well, °Deshanti Adcox, MD °[facility] °[phone]  °

## 2021-11-12 ENCOUNTER — Ambulatory Visit: Payer: No Typology Code available for payment source | Admitting: Family Medicine

## 2021-11-16 ENCOUNTER — Other Ambulatory Visit: Payer: Self-pay

## 2021-11-16 ENCOUNTER — Encounter: Payer: Self-pay | Admitting: Family Medicine

## 2021-11-16 ENCOUNTER — Ambulatory Visit (INDEPENDENT_AMBULATORY_CARE_PROVIDER_SITE_OTHER): Payer: Self-pay | Admitting: Family Medicine

## 2021-11-16 VITALS — BP 140/82 | HR 131 | Ht 64.0 in | Wt 157.0 lb

## 2021-11-16 DIAGNOSIS — R079 Chest pain, unspecified: Secondary | ICD-10-CM

## 2021-11-16 DIAGNOSIS — E119 Type 2 diabetes mellitus without complications: Secondary | ICD-10-CM

## 2021-11-16 DIAGNOSIS — I10 Essential (primary) hypertension: Secondary | ICD-10-CM

## 2021-11-16 DIAGNOSIS — Z794 Long term (current) use of insulin: Secondary | ICD-10-CM

## 2021-11-16 DIAGNOSIS — R002 Palpitations: Secondary | ICD-10-CM

## 2021-11-16 LAB — POCT GLYCOSYLATED HEMOGLOBIN (HGB A1C): HbA1c, POC (controlled diabetic range): 8.6 % — AB (ref 0.0–7.0)

## 2021-11-16 NOTE — Assessment & Plan Note (Addendum)
Reasonably controlled with switch to metoprolol to carvedilol.  Continue on losartan.  We will continue to monitor, could consider trying a different calcium channel blocker versus clonidine patch at follow-up. - BMP today to ensure hyperkalemia resolved

## 2021-11-16 NOTE — Patient Instructions (Signed)
It was nice seeing you today! ° ° ° °Please arrive at least 15 minutes prior to your scheduled appointments. ° °Stay well, °Alexxia Stankiewicz, MD °[facility] °[phone]  °

## 2021-11-16 NOTE — Progress Notes (Addendum)
° ° °  SUBJECTIVE:   CHIEF COMPLAINT / HPI:   See phone note. Meds adjusted given mild hyperkalemia.  Chest pain Patient states that she has had intermittent chest discomfort over the past week with associated palpitations.  She has had two episodes total which have occurred in the past week and last for a few minutes.  Episodes are associated with exertion and tend occur when she is walking around.  Denies shortness of breath, nausea, vomiting.  Last episode occurred this morning, lasted about 1 minute.  No chest pain currently.  She is wondering if the switch from metoprolol to carvedilol is related to her symptoms.  HTN Patient states she has been compliant with switching the metoprolol to carvedilol twice daily.  Home readings have been elevated mostly in the 140s to 150s.  T2DM On Lantus 7 units daily Reports sometimes has low blood sugars but typically during the day She is still struggling with maintaining weight and remains on mirtazapine  Ear discomfort Patient states she got water in her right ear and thinks it may need to be flushed out.  She has had some decreased hearing in the ear.  Vaginal dryness Symptoms are improving with estradiol vaginal tablet, currently using twice a week.  She is hoping to stop this altogether once she runs out of pills.  PERTINENT  PMH / PSH: HTN, T2DM, HLD, TIA, former smokeless tobacco use  OBJECTIVE:   BP 140/82    Pulse (!) 131    Ht 5\' 4"  (1.626 m)    Wt 157 lb (71.2 kg)    SpO2 100%    BMI 26.95 kg/m   General: Alert, NAD HEENT: Right TM impacted by cerumen.  Left TM with large amount of cerumen which is not occluded. CV: Tachycardic, regular rhythm, no murmurs Pulm: CTAB, no wheezes or rales Extremities: No leg swelling  ASSESSMENT/PLAN:   Exertional chest pain Intermittent exertional chest pain with palpitations over the past week, episodes are brief.  EKG obtained revealing sinus tachycardia but overall reassuring without T wave  changes or ST segment changes or obvious arrhythmia.  Previously evaluated by cardiology over 10 years ago but does not appear she ever had a stress test.  Concern for stable angina, also wonder if there is some element of relative beta-blocker withdrawal from switching to metoprolol (100 mg twice daily) to carvedilol (25 mg twice daily).  Lower suspicion for unstable angina/ACS based on presentation but strict ED precautions given. - urgent cardiology referral - labs: Mg, TSH given tachycardia  Type 2 diabetes mellitus without complications (HCC) Appears to be worsening slightly, possibly due to eating more from mirtazapine.  Was not able to address in depth today, may consider adding on short acting insulin again in the future.  HYPERTENSION, BENIGN SYSTEMIC Reasonably controlled with switch to metoprolol to carvedilol.  Continue on losartan.  We will continue to monitor, could consider trying a different calcium channel blocker versus clonidine patch at follow-up. - BMP today to ensure hyperkalemia resolved   Cerumen impaction, right Treated with irrigation today.  , MD [facility]

## 2021-11-16 NOTE — Assessment & Plan Note (Signed)
Appears to be worsening slightly, possibly due to eating more from mirtazapine.  Was not able to address in depth today, may consider adding on short acting insulin again in the future.

## 2021-11-16 NOTE — Assessment & Plan Note (Addendum)
Intermittent exertional chest pain with palpitations over the past week, episodes are brief.  EKG obtained revealing sinus tachycardia but overall reassuring without T wave changes or ST segment changes or obvious arrhythmia.  Previously evaluated by cardiology over 10 years ago but does not appear she ever had a stress test.  Concern for stable angina, also wonder if there is some element of relative beta-blocker withdrawal from switching to metoprolol (100 mg twice daily) to carvedilol (25 mg twice daily).  Lower suspicion for unstable angina/ACS based on presentation but strict ED precautions given. - urgent cardiology referral - labs: Mg, TSH given tachycardia

## 2021-11-17 ENCOUNTER — Telehealth: Payer: Self-pay | Admitting: Family Medicine

## 2021-11-17 DIAGNOSIS — E875 Hyperkalemia: Secondary | ICD-10-CM

## 2021-11-17 LAB — BASIC METABOLIC PANEL
BUN/Creatinine Ratio: 18 (ref 12–28)
BUN: 20 mg/dL (ref 8–27)
CO2: 22 mmol/L (ref 20–29)
Calcium: 10.1 mg/dL (ref 8.7–10.3)
Chloride: 100 mmol/L (ref 96–106)
Creatinine, Ser: 1.13 mg/dL — ABNORMAL HIGH (ref 0.57–1.00)
Glucose: 175 mg/dL — ABNORMAL HIGH (ref 70–99)
Potassium: 5.4 mmol/L — ABNORMAL HIGH (ref 3.5–5.2)
Sodium: 139 mmol/L (ref 134–144)
eGFR: 54 mL/min/{1.73_m2} — ABNORMAL LOW (ref >59)

## 2021-11-17 LAB — MAGNESIUM: Magnesium: 1.2 mg/dL — ABNORMAL LOW (ref 1.6–2.3)

## 2021-11-17 LAB — TSH: TSH: 0.705 u[IU]/mL (ref 0.450–4.500)

## 2021-11-17 MED ORDER — MAGNESIUM OXIDE 400 MG PO CAPS: 1.0000 | ORAL_CAPSULE | Freq: Every day | ORAL | 0 refills | Status: AC

## 2021-11-17 MED ORDER — MAGNESIUM OXIDE 400 MG PO CAPS: 1.0000 | ORAL_CAPSULE | Freq: Every day | ORAL | 0 refills | Status: DC

## 2021-11-17 NOTE — Telephone Encounter (Signed)
Called patient to discuss labs.  TSH normal.  Magnesium low at 1.2.  Will start magnesium oxide 400 mg daily, prescribed 2-week supply.  Again with mild hyperkalemia with potassium 5.4.  Suspect this may be falsely elevated due to hemolysis as there have been issues at our lab.  We will send patient over to [facility] to confirm, future order placed and will send to RN team to fax.  Scheduled follow-up on 1/17.

## 2021-11-19 ENCOUNTER — Telehealth: Payer: Self-pay

## 2021-11-19 ENCOUNTER — Other Ambulatory Visit: Payer: Self-pay | Admitting: Family Medicine

## 2021-11-19 NOTE — Telephone Encounter (Signed)
Patient calls nurse line reporting the health department dose not have Magnesium 400mg  in stock. Patient reports she purchased OTC Magnesium 250mg . Patient would like to know if she can take #2, however equalling to greater than original 400mg  prescribed.   Will forward to PCP for advisement.

## 2021-11-19 NOTE — Telephone Encounter (Signed)
Patient contacted and informed. Patient reports she went to [facility] today and had labs drawn.

## 2021-11-19 NOTE — Telephone Encounter (Signed)
Yes - that would be fine

## 2021-11-20 LAB — BASIC METABOLIC PANEL
BUN/Creatinine Ratio: 13 (ref 12–28)
BUN: 14 mg/dL (ref 8–27)
CO2: 23 mmol/L (ref 20–29)
Calcium: 9.9 mg/dL (ref 8.7–10.3)
Chloride: 101 mmol/L (ref 96–106)
Creatinine, Ser: 1.08 mg/dL — ABNORMAL HIGH (ref 0.57–1.00)
Glucose: 282 mg/dL — ABNORMAL HIGH (ref 70–99)
Potassium: 4.6 mmol/L (ref 3.5–5.2)
Sodium: 138 mmol/L (ref 134–144)
eGFR: 57 mL/min/{1.73_m2} — ABNORMAL LOW (ref >59)

## 2021-11-22 NOTE — Progress Notes (Signed)
Cardiology Office Note   Date:  11/24/2021   ID:  [name] 1956-03-14, MRN [id number]  PCP:  Littie Deeds, MD  Cardiologist:   None Referring:  Billey Co, MD  Chief Complaint  Patient presents with   Palpitations      History of Present Illness: [name] is a 66 y.o. female who is referred by Billey Co, MD for evaluation of palpitations.  She has a history of a murmur although I saw an echocardiogram in 2008 demonstrated no abnormalities.  Recently she has had some chest discomfort but she really is describing more of a fluttering in her chest.  She had recently been switched from metoprolol to carvedilol and I reviewed her primary care note.  He was worried that she might be describing angina but for me she is describing her heart racing.  She feels it going fast for several minutes.  It happens a couple times a week.  Its been going on for about 4 weeks.  She thinks it is relatively stable.  She will get some chest tightness during these episodes.  She gets lightheaded with presyncope but has not had syncope.  She cannot bring them on.  They seem to come at rest.  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  But that she does not describe chest discomfort.  She is not having any new shortness of breath, PND or orthopnea.  She has had no weight gain or edema.  She has had difficult to control hypertension which has necessitated changing beta-blockers.  Her blood sugars have not been well controlled.   Past Medical History:  Diagnosis Date   Anemia    Benign heart murmur    Closed nondisplaced fracture of proximal phalanx of left ring finger 04/08/2020   Diabetes mellitus    Gastric ulcer    Hallux rigidus of right foot 08/29/2011   History of colonoscopy    Hyperlipidemia    Hypertension    Stroke (HCC)    2008 - right arm/leg numbness.       Past Surgical History:   Procedure Laterality Date   BREAST CYST EXCISION Bilateral    BTL  1988   COLONOSCOPY  01/13/2004   Colonoscopy - normal, diverticulosis    ENDOMETRIAL BIOPSY     Hx of endometrial Bx 2004 - negative for hyperplasia or malignancy. Patient does not remember reason for testing.   ESOPHAGOGASTRODUODENOSCOPY     normal   L and R breast lumpectomies benign  1970     Current Outpatient Medications  Medication Sig Dispense Refill   aspirin 81 MG tablet Take 81 mg by mouth daily.     atorvastatin (LIPITOR) 40 MG tablet Take 1 tablet (40 mg total) by mouth daily. 90 tablet 3   carvedilol (COREG) 25 MG tablet Take 1 tablet (25 mg total) by mouth 2 (two) times daily with a meal. 60 tablet 2   Estradiol (VAGIFEM) 10 MCG TABS vaginal tablet Insert one tablet once a week for the first two weeks then twice a week on the third week. 8 tablet 0   fluticasone (FLONASE) 50 MCG/ACT nasal spray Place 2 sprays into both nostrils daily. 16 g 6   glucose blood test strip Freestyle Freedom Strips. Check sugars 4 times a day. 120 each 5   insulin glargine (LANTUS) 100 UNIT/ML injection INJECT 7  UNITS INTO THE SKIN DAILY 10 mL 0   Insulin Syringe-Needle U-100 (B-D INS SYR ULTRAFINE .5CC/30G) 30G X 1/2" 0.5 ML MISC Inject 1 Syringe into the skin 4 (four) times daily - after meals and at bedtime. daily use of insulin 1 box - supply for one month 120 each 5   Lancets Fine 28G MISC Check sugars twice a day     lisinopril (ZESTRIL) 40 MG tablet Take 1 tablet (40 mg total) by mouth daily. 90 tablet 1   Magnesium Oxide 400 MG CAPS Take 1 capsule (400 mg total) by mouth daily for 14 days. 14 capsule 0   metFORMIN (GLUCOPHAGE) 1000 MG tablet TAKE ONE TABLET BY MOUTH TWICE A DAY WITH A MEAL 180 tablet 4   mirtazapine (REMERON) 7.5 MG tablet Take 1 tablet (7.5 mg total) by mouth at bedtime. 30 tablet 2   traMADol (ULTRAM) 50 MG tablet TAKE 1 TABLET BY MOUTH 3 TIMES DAILY AS NEEDED 30 tablet 0   pantoprazole (PROTONIX) 40  MG tablet Take 1 tablet (40 mg total) by mouth daily for 14 days. 90 tablet 3   No current facility-administered medications for this visit.    Allergies:   Amlodipine and Hctz [hydrochlorothiazide]    Social History:  The patient  reports that she has never smoked. She quit smokeless tobacco use about 8 years ago.  Her smokeless tobacco use included snuff. She reports that she does not currently use alcohol. She reports that she does not use drugs.   Family History:  The patient's family history includes Alcohol abuse in her father; Diabetes in her mother and sister; Heart disease in her father.    ROS:  Please see the history of present illness.   Otherwise, review of systems are positive for none.   All other systems are reviewed and negative.    PHYSICAL EXAM: VS:  BP 140/80 (BP Location: Left Arm, Patient Position: Sitting)    Pulse 100    Ht 5\' 4"  (1.626 m)    Wt 159 lb 9.6 oz (72.4 kg)    SpO2 99%    BMI 27.40 kg/m  , BMI Body mass index is 27.4 kg/m. GENERAL:  Well appearing HEENT:  Pupils equal round and reactive, fundi not visualized, oral mucosa unremarkable NECK:  No jugular venous distention, waveform within normal limits, carotid upstroke brisk and symmetric, no bruits, no thyromegaly LYMPHATICS:  No cervical, inguinal adenopathy LUNGS:  Clear to auscultation bilaterally BACK:  No CVA tenderness CHEST:  Unremarkable HEART:  PMI not displaced or sustained,S1 and S2 within normal limits, no S3, no S4, positive snap, no rubs, 2 out of 6 very soft apical and slight axillary systolic murmur murmurs ABD:  Flat, positive bowel sounds normal in frequency in pitch, positive midline bruits, no rebound, no guarding, no midline pulsatile mass, no hepatomegaly, no splenomegaly EXT:  2 plus pulses throughout, no edema, no cyanosis no clubbing SKIN:  No rashes no nodules NEURO:  Cranial nerves II through XII grossly intact, motor grossly intact throughout PSYCH:  Cognitively intact,  oriented to person place and time    EKG:  EKG is ordered today. The ekg ordered today demonstrates sinus rhythm, rate 100, axis within normal limits, intervals within normal limits, no acute ST-T wave changes.   Recent Labs: 03/29/2021: ALT 6; Hemoglobin 10.3; Platelets 230 11/16/2021: Magnesium 1.2; TSH 0.705 11/19/2021: BUN 14; Creatinine, Ser 1.08; Potassium 4.6; Sodium 138    Lipid Panel    Component Value Date/Time  CHOL 114 10/27/2021 1104   TRIG 84 10/27/2021 1104   HDL 53 10/27/2021 1104   CHOLHDL 2.2 10/27/2021 1104   CHOLHDL 1.6 06/09/2016 1012   VLDL 15 06/09/2016 1012   LDLCALC 44 10/27/2021 1104   LDLDIRECT 83 04/06/2020 1447   LDLDIRECT 62 05/16/2007 2138      Wt Readings from Last 3 Encounters:  11/24/21 159 lb 9.6 oz (72.4 kg)  11/16/21 157 lb (71.2 kg)  10/27/21 161 lb 3.2 oz (73.1 kg)      Other studies Reviewed: Additional studies/ records that were reviewed today include: Previous echocardiogram and primary care records as well as labs. Review of the above records demonstrates:  Please see elsewhere in the note.     ASSESSMENT AND PLAN:  PALPITATIONS: I am first going to start with a 4-week monitor.  She has had normal electrolytes and TSH recently.  I am not going to change the medications at this point but will make further determinations based on the results.  MURMUR: I hear a slight click it could be mitral valve prolapse click though this was not mentioned in 2008.  There is a very soft murmur.  I am going to check an echocardiogram.  CHEST PAIN: This seems to be more associated with palpitations but she does have significant cardiovascular risk factors and in fact an abdominal bruit I will have a low threshold for screening for coronary disease in the future.  DM: I do see that her A1c was 8.6 and she apparently has a follow-up appointment soon with Littie Deeds, MD to further address this.   Current medicines are reviewed at length with the  patient today.  The patient does not have concerns regarding medicines.  The following changes have been made:  no change  Labs/ tests ordered today include:   Orders Placed This Encounter  Procedures   CARDIAC EVENT MONITOR   EKG 12-Lead   ECHOCARDIOGRAM COMPLETE     Disposition:   FU with me after the above testing.     Signed, Rollene Rotunda, MD  11/24/2021 10:05 AM    [facility]

## 2021-11-23 ENCOUNTER — Encounter: Payer: Self-pay | Admitting: Family Medicine

## 2021-11-24 ENCOUNTER — Encounter: Payer: Self-pay | Admitting: *Deleted

## 2021-11-24 ENCOUNTER — Ambulatory Visit (INDEPENDENT_AMBULATORY_CARE_PROVIDER_SITE_OTHER): Payer: Self-pay | Admitting: Cardiology

## 2021-11-24 ENCOUNTER — Encounter: Payer: Self-pay | Admitting: Cardiology

## 2021-11-24 ENCOUNTER — Other Ambulatory Visit: Payer: Self-pay

## 2021-11-24 VITALS — BP 140/80 | HR 100 | Ht 64.0 in | Wt 159.6 lb

## 2021-11-24 DIAGNOSIS — R002 Palpitations: Secondary | ICD-10-CM

## 2021-11-24 DIAGNOSIS — R011 Cardiac murmur, unspecified: Secondary | ICD-10-CM

## 2021-11-24 NOTE — Patient Instructions (Signed)
Medication Instructions:  Your physician recommends that you continue on your current medications as directed. Please refer to the Current Medication list given to you today.  *If you need a refill on your cardiac medications before your next appointment, please call your pharmacy*   Testing/Procedures: Your physician has requested that you have an echocardiogram. Echocardiography is a painless test that uses sound waves to create images of your heart. It provides your doctor with information about the size and shape of your heart and how well your hearts chambers and valves are working. This procedure takes approximately one hour. There are no restrictions for this procedure. This is done at [address] [employer]  Your physician has recommended that you wear an event monitor. Event monitors are medical devices that record the hearts electrical activity. Doctors most often [place] these monitors to diagnose arrhythmias. Arrhythmias are problems with the speed or rhythm of the heartbeat. The monitor is a small, portable device. You can wear one while you do your normal daily activities. This is usually used to diagnose what is causing palpitations/syncope (passing out). This will come to you in the mail with instructions on application. You will wear this for 4 weeks.   Follow-Up: At [facility], you and your health needs are our priority.  As part of our continuing mission to provide you with exceptional heart care, we have created designated Provider Care Teams.  These Care Teams include your primary Cardiologist (physician) and Advanced Practice Providers (APPs -  Physician Assistants and Nurse Practitioners) who all work together to provide you with the care you need, when you need it.  We recommend signing up for the patient portal called "MyChart".  Sign up information is provided on this After Visit Summary.  MyChart is used to connect with patients for Virtual Visits  (Telemedicine).  Patients are able to view lab/test results, encounter notes, upcoming appointments, etc.  Non-urgent messages can be sent to your provider as well.   To learn more about what you can do with MyChart, go to [email or web address].    Your next appointment:   6-8 week(s)  The format for your next appointment:   In Person  Provider:   Dr. Rollene Rotunda

## 2021-11-25 ENCOUNTER — Telehealth: Payer: Self-pay | Admitting: Licensed Clinical Social Worker

## 2021-11-25 ENCOUNTER — Telehealth: Payer: Self-pay | Admitting: Cardiology

## 2021-11-25 NOTE — Telephone Encounter (Signed)
Patient called about her even monitor, she said she can't afford it. She doesn't have insurance.

## 2021-11-25 NOTE — Telephone Encounter (Signed)
Spoke with patient who states she will not have a Medicare card until the first week of February, that she does not have health insurance to cover the event monitor. She will let us know when she gets the Medicare card so she can be scheduled to receive the monitor.

## 2021-11-25 NOTE — Telephone Encounter (Signed)
Received inquiry about pt coverage for monitor. Unfortunately, despite having [employer] and [employer] neither will cover the cost of the monitor. I noted that pt will be 66 next month and is eligible to enroll in Medicare. RN Maralyn Sago B has f/u with pt and she has enrolled in Medicare. I will mail her information about [employer] just in case she has any additional questions/concerns about her benefits.   Octavio Graves, MSW, LCSW Clinical Social Worker II [facility]  [phone]- work cell phone (preferred) [phone]- desk phone

## 2021-11-29 NOTE — Telephone Encounter (Signed)
Patient 11/24/21 enrollment for [employer] to mail a cardiac event monitor cancelled per patient.   Patient to inform office when she receives her Medicare card so she can be schedule to receive the monitor.

## 2021-11-30 ENCOUNTER — Telehealth: Payer: Self-pay | Admitting: Cardiology

## 2021-11-30 ENCOUNTER — Other Ambulatory Visit: Payer: Self-pay

## 2021-11-30 ENCOUNTER — Ambulatory Visit (INDEPENDENT_AMBULATORY_CARE_PROVIDER_SITE_OTHER): Payer: Self-pay | Admitting: Family Medicine

## 2021-11-30 VITALS — BP 142/78 | HR 88 | Wt 159.6 lb

## 2021-11-30 DIAGNOSIS — E119 Type 2 diabetes mellitus without complications: Secondary | ICD-10-CM

## 2021-11-30 DIAGNOSIS — I10 Essential (primary) hypertension: Secondary | ICD-10-CM

## 2021-11-30 DIAGNOSIS — Z794 Long term (current) use of insulin: Secondary | ICD-10-CM

## 2021-11-30 MED ORDER — EMPAGLIFLOZIN 10 MG PO TABS: 10.0000 mg | ORAL_TABLET | Freq: Every day | ORAL | 3 refills | Status: DC

## 2021-11-30 MED ORDER — INSULIN ASPART 100 UNIT/ML IJ SOLN: 2.0000 [IU] | Freq: Three times a day (TID) | INTRAMUSCULAR | 1 refills | Status: DC

## 2021-11-30 MED ORDER — HYDROCHLOROTHIAZIDE 12.5 MG PO CAPS: 12.5000 mg | ORAL_CAPSULE | Freq: Every day | ORAL | 0 refills | Status: DC

## 2021-11-30 NOTE — Patient Instructions (Addendum)
It was nice seeing you today!  Start HCTZ 12.5 mg for blood pressure.  Come back in 2 weeks for blood pressure check with nurse visit and labs.  Start Novolog 2 units with meals. Check your blood sugars multiple times a day either before eating or 2-3 hours after a meal.  If your fasting sugar is less than 100, you can decrease your Lantus by 1 unit.  Schedule with pharmacy clinic in 2-4 weeks.  Follow up with me in 3 months or sooner.  Give [place] a call if any issues.  Stay well, Littie Deeds, MD [facility] [phone]  --  Make sure to check out at the front desk before you leave today.  Please arrive at least 15 minutes prior to your scheduled appointments.  If you had blood work today, I will send you a MyChart message or a letter if results are normal. Otherwise, I will give you a call.  If you had a referral placed, they will call you to set up an appointment. Please give [place] a call if you don't hear back in the next 2 weeks.  If you need additional refills before your next appointment, please call your pharmacy first.

## 2021-11-30 NOTE — Telephone Encounter (Signed)
Patient called in to say that he unable to afford the heart monitor, that coming in the mail to her. Please advise

## 2021-11-30 NOTE — Assessment & Plan Note (Addendum)
A1c not at goal, slightly worse.  Fairly low fasting sugars preclude increasing Lantus.  I do not think GLP-1 will be a good option for her at this time as she already struggles with poor appetite.  Initially planned to start short acting insulin 2 units with meals.  However, on further chart review, it appears that he had never been started on an SGLT2 inhibitor.  I called the patient and she was amenable to trying empagliflozin instead of short acting insulin. - continue metformin, Lantus 7 units - start empagliflozin 10 mg

## 2021-11-30 NOTE — Assessment & Plan Note (Signed)
Not at goal, will add on HCTZ.  Patient willing to try this medication again.  Continue carvedilol and lisinopril. - RN visit for BP check in 2 weeks, also will obtain BMP and Mg at that time, future orders placed

## 2021-11-30 NOTE — Telephone Encounter (Signed)
Spoke with patient who stated she cannot afford the long term monitor until her Medicare card comes (early Feb). She spoke with someone from [place] and received a letter from them. I suggested she call them and let them know she will inform them when she receives her Medicare card. I gave her the [facility] Patient Assistance Program number as well.

## 2021-11-30 NOTE — Progress Notes (Signed)
° ° °  SUBJECTIVE:   CHIEF COMPLAINT / HPI: f/u HTN  At last visit had chest discomfort/palpitations referred to cardiology with plan for 4-week monitor likely to be postponed until next month due to insurance (will qualify for Medicare then). Low Mg 1.2 started on supplementation.  She states that she has not had any further palpitations recently.  T2DM On Lantus 7 units daily, metformin 1000 mg BID Fasting sugars 90s-110s, afternoon in the 200s. Has gone up to 400s during the day. She typically eats 2 meals a day, 1 in the morning and 1 around noon.  She will on occasion eating a third meal around 4 or 6:00. Denies symptomatic hypoglycemia. Patient believes she did try Jardiance before but was taken off of it for some reason.  HTN On carvedilol 25 mg BID and lisinopril 40 mg. Patient states that she only recently started taking the carvedilol because the pharmacy had given her the wrong prescription.  (Spironolactone instead of carvedilol). BP readings at home reviewed, ranged from the 140s to 160s over 80s mostly.  There was a single reading with SBP in the 130s. HCTZ listed in the chart causing gout flare, but patient states that she was told by her doctor that she did not have gout but rather OA.  She would be willing to try HCTZ again.  PERTINENT  PMH / PSH: T2DM, HTN  OBJECTIVE:   BP (!) 142/78    Pulse 88    Wt 159 lb 9.6 oz (72.4 kg)    SpO2 100%    BMI 27.40 kg/m   General: Alert, NAD CV: RRR, 2/6 systolic murmur at the LUSB Pulm: CTAB, no wheezes or rales  Wt Readings from Last 3 Encounters:  11/30/21 159 lb 9.6 oz (72.4 kg)  11/24/21 159 lb 9.6 oz (72.4 kg)  11/16/21 157 lb (71.2 kg)     ASSESSMENT/PLAN:   HYPERTENSION, BENIGN SYSTEMIC Not at goal, will add on HCTZ.  Patient willing to try this medication again.  Continue carvedilol and lisinopril. - RN visit for BP check in 2 weeks, also will obtain BMP and Mg at that time, future orders placed  Type 2 diabetes  mellitus without complications (HCC) A1c not at goal, slightly worse.  Fairly low fasting sugars preclude increasing Lantus.  I do not think GLP-1 will be a good option for her at this time as she already struggles with poor appetite.  Initially planned to start short acting insulin 2 units with meals.  However, on further chart review, it appears that he had never been started on an SGLT2 inhibitor.  I called the patient and she was amenable to trying empagliflozin instead of short acting insulin. - continue metformin, Lantus 7 units - start empagliflozin 10 mg     Littie Deeds, MD [facility]

## 2021-12-01 ENCOUNTER — Telehealth: Payer: Self-pay

## 2021-12-01 NOTE — Telephone Encounter (Signed)
[name] calls nurse line in regards to HCTZ 12.5mg  prescription. Pharmacy reports an allergy to this medication and they only have 25mg  tablets in stock.   Clarified with pharmacy the allergy was discussed at [place] and patient is will to try. Verbal given to switch to 25mg  tablets taking 1/2 once per day.

## 2021-12-06 ENCOUNTER — Other Ambulatory Visit: Payer: Self-pay | Admitting: Family Medicine

## 2021-12-06 ENCOUNTER — Other Ambulatory Visit (HOSPITAL_COMMUNITY): Payer: Self-pay

## 2021-12-06 DIAGNOSIS — S62645A Nondisplaced fracture of proximal phalanx of left ring finger, initial encounter for closed fracture: Secondary | ICD-10-CM

## 2021-12-06 MED ORDER — TRAMADOL HCL 50 MG PO TABS
50.0000 mg | ORAL_TABLET | Freq: Two times a day (BID) | ORAL | 0 refills | Status: DC | PRN
  Filled 2021-12-06: qty 20, 10d supply, fill #0

## 2021-12-06 NOTE — Addendum Note (Signed)
Addended by: Littie Deeds D on: 12/06/2021 05:54 PM   Modules accepted: Orders

## 2021-12-06 NOTE — Telephone Encounter (Signed)
OK to refill, I had thought this was an automated request. Will decrease frequency. We did discuss weaning/discontinuing previously and will continue to address in the future. Agree that is too much Aleve. Tylenol would be a safer choice.

## 2021-12-06 NOTE — Telephone Encounter (Signed)
Patient calls nurse line regarding request for tramadol. Patient reports that she is requesting this for neck pain, as she has been taking increased amount of Aleve. Reports taking 5-6 aleve per day.   Advised patient that I would not recommend this dosing of Aleve. Advised patient that I would reach out to provider for further medication management.   Please advise.  Veronda Prude, RN

## 2021-12-07 ENCOUNTER — Other Ambulatory Visit (HOSPITAL_COMMUNITY): Payer: Self-pay

## 2021-12-08 ENCOUNTER — Other Ambulatory Visit (HOSPITAL_COMMUNITY): Payer: Self-pay

## 2021-12-09 ENCOUNTER — Ambulatory Visit (HOSPITAL_COMMUNITY): Payer: Self-pay | Attending: Cardiology

## 2021-12-09 ENCOUNTER — Other Ambulatory Visit: Payer: Self-pay

## 2021-12-09 DIAGNOSIS — R011 Cardiac murmur, unspecified: Secondary | ICD-10-CM | POA: Insufficient documentation

## 2021-12-09 DIAGNOSIS — R002 Palpitations: Secondary | ICD-10-CM | POA: Insufficient documentation

## 2021-12-09 LAB — ECHOCARDIOGRAM COMPLETE
Area-P 1/2: 3.39 cm2
S' Lateral: 2.8 cm

## 2021-12-09 NOTE — Progress Notes (Unsigned)
Cardiology Office Note   Date:  12/09/2021   ID:  Monica, Hanson 20-Jul-1956, MRN [id number]  PCP:  Monica Deeds, MD  Cardiologist:   None Referring:  Monica Rotunda, MD  No chief complaint on file.     History of Present Illness: [name] is a 66 y.o. female who is referred by Monica Rotunda, MD for evaluation of palpitations.  She has a history of a murmur although I saw an echocardiogram in 2008 demonstrated no abnormalities.  ***   Recently she has had some chest discomfort but she really is describing more of a fluttering in her chest.  She had recently been switched from metoprolol to carvedilol and I reviewed her primary care note.  He was worried that she might be describing angina but for me she is describing her heart racing.  She feels it going fast for several minutes.  It happens a couple times a week.  Its been going on for about 4 weeks.  She thinks it is relatively stable.  She will get some chest tightness during these episodes.  She gets lightheaded with presyncope but has not had syncope.  She cannot bring them on.  They seem to come at rest.  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  But that she does not describe chest discomfort.  She is not having any new shortness of breath, PND or orthopnea.  She has had no weight gain or edema.  She has had difficult to control hypertension which has necessitated changing beta-blockers.  Her blood sugars have not been well controlled.   Past Medical History:  Diagnosis Date   Anemia    Benign heart murmur    Closed nondisplaced fracture of proximal phalanx of left ring finger 04/08/2020   Diabetes mellitus    Gastric ulcer    Hallux rigidus of right foot 08/29/2011   History of colonoscopy    Hyperlipidemia    Hypertension    Stroke (HCC)    2008 - right arm/leg numbness.       Past Surgical History:  Procedure Laterality  Date   BREAST CYST EXCISION Bilateral    BTL  1988   COLONOSCOPY  01/13/2004   Colonoscopy - normal, diverticulosis    ENDOMETRIAL BIOPSY     Hx of endometrial Bx 2004 - negative for hyperplasia or malignancy. Patient does not remember reason for testing.   ESOPHAGOGASTRODUODENOSCOPY     normal   L and R breast lumpectomies benign  1970     Current Outpatient Medications  Medication Sig Dispense Refill   aspirin 81 MG tablet Take 81 mg by mouth daily.     atorvastatin (LIPITOR) 40 MG tablet Take 1 tablet (40 mg total) by mouth daily. 90 tablet 3   carvedilol (COREG) 25 MG tablet Take 1 tablet (25 mg total) by mouth 2 (two) times daily with a meal. 60 tablet 2   empagliflozin (JARDIANCE) 10 MG TABS tablet Take 1 tablet (10 mg total) by mouth daily. 90 tablet 3   Estradiol (VAGIFEM) 10 MCG TABS vaginal tablet Insert one tablet once a week for the first two weeks then twice a week on the third week. 8 tablet 0   fluticasone (FLONASE) 50 MCG/ACT nasal spray Place 2 sprays into both nostrils daily. 16 g 6   glucose blood test strip Freestyle Freedom Strips. Check sugars 4  times a day. 120 each 5   hydrochlorothiazide (MICROZIDE) 12.5 MG capsule Take 1 capsule (12.5 mg total) by mouth daily. 30 capsule 0   insulin glargine (LANTUS) 100 UNIT/ML injection INJECT 7 UNITS INTO THE SKIN DAILY 10 mL 0   Insulin Syringe-Needle U-100 (B-D INS SYR ULTRAFINE .5CC/30G) 30G X 1/2" 0.5 ML MISC Inject 1 Syringe into the skin 4 (four) times daily - after meals and at bedtime. daily use of insulin 1 box - supply for one month 120 each 5   Lancets Fine 28G MISC Check sugars twice a day     lisinopril (ZESTRIL) 40 MG tablet Take 1 tablet (40 mg total) by mouth daily. 90 tablet 1   metFORMIN (GLUCOPHAGE) 1000 MG tablet TAKE ONE TABLET BY MOUTH TWICE A DAY WITH A MEAL 180 tablet 4   mirtazapine (REMERON) 7.5 MG tablet Take 1 tablet (7.5 mg total) by mouth at bedtime. 30 tablet 2   pantoprazole (PROTONIX) 40 MG  tablet Take 1 tablet (40 mg total) by mouth daily for 14 days. 90 tablet 3   traMADol (ULTRAM) 50 MG tablet Take 1 tablet (50 mg total) by mouth 2 (two) times daily as needed. 20 tablet 0   No current facility-administered medications for this visit.    Allergies:   Amlodipine and Hctz [hydrochlorothiazide]    Social History:  The patient  reports that she has never smoked. She quit smokeless tobacco use about 8 years ago.  Her smokeless tobacco use included snuff. She reports that she does not currently use alcohol. She reports that she does not use drugs.   Family History:  The patient's family history includes Alcohol abuse in her father; Diabetes in her mother and sister; Heart disease in her father.    ROS:  Please see the history of present illness.   Otherwise, review of systems are positive for none.   All other systems are reviewed and negative.    PHYSICAL EXAM: VS:  There were no vitals taken for this visit. , BMI There is no height or weight on file to calculate BMI. GENERAL:  Well appearing HEENT:  Pupils equal round and reactive, fundi not visualized, oral mucosa unremarkable NECK:  No jugular venous distention, waveform within normal limits, carotid upstroke brisk and symmetric, no bruits, no thyromegaly LYMPHATICS:  No cervical, inguinal adenopathy LUNGS:  Clear to auscultation bilaterally BACK:  No CVA tenderness CHEST:  Unremarkable HEART:  PMI not displaced or sustained,S1 and S2 within normal limits, no S3, no S4, positive snap, no rubs, 2 out of 6 very soft apical and slight axillary systolic murmur murmurs ABD:  Flat, positive bowel sounds normal in frequency in pitch, positive midline bruits, no rebound, no guarding, no midline pulsatile mass, no hepatomegaly, no splenomegaly EXT:  2 plus pulses throughout, no edema, no cyanosis no clubbing SKIN:  No rashes no nodules NEURO:  Cranial nerves II through XII grossly intact, motor grossly intact throughout PSYCH:   Cognitively intact, oriented to person place and time    EKG:  EKG is ordered today. The ekg ordered today demonstrates sinus rhythm, rate 100, axis within normal limits, intervals within normal limits, no acute ST-T wave changes.   Recent Labs: 03/29/2021: ALT 6; Hemoglobin 10.3; Platelets 230 11/16/2021: Magnesium 1.2; TSH 0.705 11/19/2021: BUN 14; Creatinine, Ser 1.08; Potassium 4.6; Sodium 138    Lipid Panel    Component Value Date/Time   CHOL 114 10/27/2021 1104   TRIG 84 10/27/2021 1104  HDL 53 10/27/2021 1104   CHOLHDL 2.2 10/27/2021 1104   CHOLHDL 1.6 06/09/2016 1012   VLDL 15 06/09/2016 1012   LDLCALC 44 10/27/2021 1104   LDLDIRECT 83 04/06/2020 1447   LDLDIRECT 62 05/16/2007 2138      Wt Readings from Last 3 Encounters:  11/30/21 159 lb 9.6 oz (72.4 kg)  11/24/21 159 lb 9.6 oz (72.4 kg)  11/16/21 157 lb (71.2 kg)      Other studies Reviewed: Additional studies/ records that were reviewed today include: Previous echocardiogram and primary care records as well as labs. Review of the above records demonstrates:  Please see elsewhere in the note.     ASSESSMENT AND PLAN:  PALPITATIONS: ***  I am first going to start with a 4-week monitor.  She has had normal electrolytes and TSH recently.  I am not going to change the medications at this point but will make further determinations based on the results.  MURMUR:  She had no significant abnormalities on her echo except some mild septal hypertrophy.  ***  I hear a slight click it could be mitral valve prolapse click though this was not mentioned in 2008.  There is a very soft murmur.  I am going to check an echocardiogram.  CHEST PAIN:   ***  This seems to be more associated with palpitations but she does have significant cardiovascular risk factors and in fact an abdominal bruit I will have a low threshold for screening for coronary disease in the future.  DM: I do see that her A1c was ***  8.6 and she apparently has a  follow-up appointment soon with Monica DeedsSun, Richard, MD to further address this.   Current medicines are reviewed at length with the patient today.  The patient does not have concerns regarding medicines.  The following changes have been made:  no change  Labs/ tests ordered today include:   No orders of the defined types were placed in this encounter.    Disposition:   FU with me after the above testing.     Signed, Monica RotundaJames Londen Bok, MD  12/09/2021 8:48 PM    [facility]

## 2021-12-14 ENCOUNTER — Other Ambulatory Visit: Payer: No Typology Code available for payment source

## 2021-12-14 ENCOUNTER — Ambulatory Visit: Payer: No Typology Code available for payment source

## 2021-12-16 ENCOUNTER — Other Ambulatory Visit: Payer: No Typology Code available for payment source

## 2021-12-16 ENCOUNTER — Other Ambulatory Visit: Payer: Self-pay

## 2021-12-16 ENCOUNTER — Ambulatory Visit (INDEPENDENT_AMBULATORY_CARE_PROVIDER_SITE_OTHER): Payer: Self-pay

## 2021-12-16 DIAGNOSIS — Z0130 Encounter for examination of blood pressure without abnormal findings: Secondary | ICD-10-CM

## 2021-12-16 NOTE — Progress Notes (Signed)
Patient here today for BP check.      Last BP was on 11/30/2021 and was 142/78.  BP today is 124/68 with a pulse of 82.    Checked BP in left arm with regular adult cuff.    Symptoms present: none.   Patient taking all BP medications as prescribed. Patient also had future labs ordered for today. Assisted patient to lab after our visit. Advised patient that provider would be in contact regarding these results.   Will route to PCP.   Talbot Grumbling, RN

## 2021-12-17 ENCOUNTER — Telehealth: Payer: Self-pay | Admitting: Family Medicine

## 2021-12-17 ENCOUNTER — Other Ambulatory Visit: Payer: Self-pay | Admitting: Family Medicine

## 2021-12-17 LAB — BASIC METABOLIC PANEL
BUN/Creatinine Ratio: 14 (ref 12–28)
BUN: 15 mg/dL (ref 8–27)
CO2: 26 mmol/L (ref 20–29)
Calcium: 9.3 mg/dL (ref 8.7–10.3)
Chloride: 99 mmol/L (ref 96–106)
Creatinine, Ser: 1.09 mg/dL — ABNORMAL HIGH (ref 0.57–1.00)
Glucose: 187 mg/dL — ABNORMAL HIGH (ref 70–99)
Potassium: 4.3 mmol/L (ref 3.5–5.2)
Sodium: 138 mmol/L (ref 134–144)
eGFR: 57 mL/min/{1.73_m2} — ABNORMAL LOW (ref >59)

## 2021-12-17 LAB — MAGNESIUM: Magnesium: 1.4 mg/dL — ABNORMAL LOW (ref 1.6–2.3)

## 2021-12-17 NOTE — Telephone Encounter (Signed)
Called patient to discuss results.  Mg improved but still low - continue Mg supplement.  Canceled clinical pharmacist appt as we decided against short acting insulin.  She told me long term monitor results were good.  Her children are surprising her with a trip to [facility] [address] next week.

## 2021-12-20 ENCOUNTER — Other Ambulatory Visit: Payer: Self-pay

## 2021-12-20 DIAGNOSIS — I10 Essential (primary) hypertension: Secondary | ICD-10-CM

## 2021-12-20 MED ORDER — LISINOPRIL 40 MG PO TABS: 40.0000 mg | ORAL_TABLET | Freq: Every day | ORAL | 3 refills | Status: DC

## 2021-12-22 ENCOUNTER — Other Ambulatory Visit: Payer: Self-pay

## 2021-12-22 MED ORDER — CARVEDILOL 25 MG PO TABS: 25.0000 mg | ORAL_TABLET | Freq: Two times a day (BID) | ORAL | 2 refills | Status: DC

## 2021-12-22 MED ORDER — PANTOPRAZOLE SODIUM 40 MG PO TBEC: 40.0000 mg | DELAYED_RELEASE_TABLET | Freq: Every day | ORAL | 3 refills | Status: DC

## 2021-12-23 ENCOUNTER — Ambulatory Visit: Payer: No Typology Code available for payment source | Admitting: Pharmacist

## 2021-12-27 ENCOUNTER — Other Ambulatory Visit: Payer: Self-pay

## 2021-12-27 DIAGNOSIS — E1169 Type 2 diabetes mellitus with other specified complication: Secondary | ICD-10-CM

## 2021-12-27 DIAGNOSIS — E119 Type 2 diabetes mellitus without complications: Secondary | ICD-10-CM

## 2021-12-27 DIAGNOSIS — E785 Hyperlipidemia, unspecified: Secondary | ICD-10-CM

## 2021-12-27 DIAGNOSIS — Z794 Long term (current) use of insulin: Secondary | ICD-10-CM

## 2021-12-27 MED ORDER — METFORMIN HCL 1000 MG PO TABS: ORAL_TABLET | ORAL | 4 refills | Status: DC

## 2021-12-27 MED ORDER — ATORVASTATIN CALCIUM 40 MG PO TABS: 40.0000 mg | ORAL_TABLET | Freq: Every day | ORAL | 3 refills | Status: DC

## 2021-12-27 MED ORDER — "BD INSULIN SYRINGE ULTRAFINE 30G X 1/2"" 0.5 ML MISC": 1.0000 | Freq: Three times a day (TID) | 5 refills | Status: DC

## 2022-01-05 ENCOUNTER — Other Ambulatory Visit: Payer: Self-pay

## 2022-01-05 MED ORDER — "BD INSULIN SYRINGE ULTRAFINE 30G X 1/2"" 0.5 ML MISC": 1.0000 | Freq: Three times a day (TID) | 5 refills | Status: DC

## 2022-01-05 MED ORDER — LANCETS ULTRA FINE MISC: 2 refills | Status: AC

## 2022-01-05 MED ORDER — INSULIN GLARGINE 100 UNIT/ML ~~LOC~~ SOLN: SUBCUTANEOUS | 0 refills | Status: DC

## 2022-01-06 NOTE — Progress Notes (Signed)
Cardiology Office Note   Date:  01/07/2022   ID:  Gracianna, Vink 1956/03/24, MRN [id number]  PCP:  Littie Deeds, MD  Cardiologist:   Rollene Rotunda, MD Referring:  Littie Deeds, MD  Chief Complaint  Patient presents with   Chest Pain      History of Present Illness: [name] is a 66 y.o. female who is referred by Littie Deeds, MD for evaluation of palpitations.  She has a history of a murmur although I saw an echocardiogram in 2008 demonstrated no abnormalities.   She had chest pain when I saw her in Jan.  She had palps.  An echo demonstrated mild septal hypertrophy.   An event monitor was ordered.  However, at the time she did not have Medicare and she could not afford this.  She said that since that time her palpitations have actually improved and they are not bothering her.  She is not having any new shortness of breath, PND or orthopnea.  She has no presyncope or syncope.  She is getting some chest discomfort.  This is when she carries groceries up the stairs.  She does not notice it with usual activity such as using a vacuum cleaner.  She is not having any resting chest pressure, neck or arm discomfort.  The discomfort goes away when she stops what she is doing.  It seems to be mild in intensity but fairly reproducible with exertion.   Past Medical History:  Diagnosis Date   Anemia    Benign heart murmur    Closed nondisplaced fracture of proximal phalanx of left ring finger 04/08/2020   Diabetes mellitus    Gastric ulcer    Hallux rigidus of right foot 08/29/2011   History of colonoscopy    Hyperlipidemia    Hypertension    Stroke (HCC)    2008 - right arm/leg numbness.       Past Surgical History:  Procedure Laterality Date   BREAST CYST EXCISION Bilateral    BTL  1988   COLONOSCOPY  01/13/2004   Colonoscopy - normal, diverticulosis    ENDOMETRIAL BIOPSY     Hx of endometrial Bx 2004 - negative for hyperplasia or malignancy. Patient does not  remember reason for testing.   ESOPHAGOGASTRODUODENOSCOPY     normal   L and R breast lumpectomies benign  1970     Current Outpatient Medications  Medication Sig Dispense Refill   aspirin 81 MG tablet Take 81 mg by mouth daily.     atorvastatin (LIPITOR) 40 MG tablet Take 1 tablet (40 mg total) by mouth daily. 90 tablet 3   carvedilol (COREG) 25 MG tablet Take 1 tablet (25 mg total) by mouth 2 (two) times daily with a meal. 60 tablet 2   glucose blood test strip Freestyle Freedom Strips. Check sugars 4 times a day. 120 each 5   hydrochlorothiazide (MICROZIDE) 12.5 MG capsule Take 1 capsule (12.5 mg total) by mouth daily. 30 capsule 0   insulin glargine (LANTUS) 100 UNIT/ML injection INJECT 7 UNITS INTO THE SKIN DAILY 10 mL 0   Insulin Syringe-Needle U-100 (B-D INS SYR ULTRAFINE .5CC/30G) 30G X 1/2" 0.5 ML MISC Inject 1 Syringe into the skin 4 (four) times daily - after meals and at bedtime. daily use of insulin 1 box - supply for one month 120 each 5   Lancets Fine 28G MISC Check sugars twice a day     Lancets Ultra Fine MISC Use as needed  100 each 2   lisinopril (ZESTRIL) 40 MG tablet Take 1 tablet (40 mg total) by mouth daily. 90 tablet 3   metFORMIN (GLUCOPHAGE) 1000 MG tablet TAKE ONE TABLET BY MOUTH TWICE A DAY WITH A MEAL 180 tablet 4   mirtazapine (REMERON) 7.5 MG tablet Take 1 tablet (7.5 mg total) by mouth at bedtime. 30 tablet 2   pantoprazole (PROTONIX) 40 MG tablet Take 1 tablet (40 mg total) by mouth daily. 90 tablet 3   traMADol (ULTRAM) 50 MG tablet Take 1 tablet (50 mg total) by mouth 2 (two) times daily as needed. 20 tablet 0   empagliflozin (JARDIANCE) 10 MG TABS tablet Take 1 tablet (10 mg total) by mouth daily. (Patient not taking: Reported on 01/07/2022) 90 tablet 3   Estradiol (VAGIFEM) 10 MCG TABS vaginal tablet Insert one tablet once a week for the first two weeks then twice a week on the third week. (Patient not taking: Reported on 01/07/2022) 8 tablet 0   fluticasone  (FLONASE) 50 MCG/ACT nasal spray Place 2 sprays into both nostrils daily. (Patient not taking: Reported on 01/07/2022) 16 g 6   No current facility-administered medications for this visit.    Allergies:   Amlodipine and Hctz [hydrochlorothiazide]    ROS:  Please see the history of present illness.   Otherwise, review of systems are positive for none.   All other systems are reviewed and negative.    PHYSICAL EXAM: VS:  BP 138/76    Pulse 77    Ht 5\' 4"  (1.626 m)    Wt 163 lb 12.8 oz (74.3 kg)    SpO2 94%    BMI 28.12 kg/m  , BMI Body mass index is 28.12 kg/m. GENERAL:  Well appearing NECK:  No jugular venous distention, waveform within normal limits, carotid upstroke brisk and symmetric, no bruits, no thyromegaly LUNGS:  Clear to auscultation bilaterally CHEST:  Unremarkable HEART:  PMI not displaced or sustained,S1 and S2 within normal limits, no S3, no S4, no clicks, no rubs, 2 out of 6 soft apical systolic murmur not increasing with the strain phase of Valsalva, no diastolic murmurs ABD:  Flat, positive bowel sounds normal in frequency in pitch, no bruits, no rebound, no guarding, no midline pulsatile mass, no hepatomegaly, no splenomegaly EXT:  2 plus pulses throughout, no edema, no cyanosis no clubbing   EKG:  EKG is not ordered today.    Recent Labs: 03/29/2021: ALT 6; Hemoglobin 10.3; Platelets 230 11/16/2021: TSH 0.705 12/16/2021: BUN 15; Creatinine, Ser 1.09; Magnesium 1.4; Potassium 4.3; Sodium 138    Lipid Panel    Component Value Date/Time   CHOL 114 10/27/2021 1104   TRIG 84 10/27/2021 1104   HDL 53 10/27/2021 1104   CHOLHDL 2.2 10/27/2021 1104   CHOLHDL 1.6 06/09/2016 1012   VLDL 15 06/09/2016 1012   LDLCALC 44 10/27/2021 1104   LDLDIRECT 83 04/06/2020 1447   LDLDIRECT 62 05/16/2007 2138      Wt Readings from Last 3 Encounters:  01/07/22 163 lb 12.8 oz (74.3 kg)  11/30/21 159 lb 9.6 oz (72.4 kg)  11/24/21 159 lb 9.6 oz (72.4 kg)      Other studies  Reviewed: Additional studies/ records that were reviewed today include: Echo Review of the above records demonstrates:  Please see elsewhere in the note.     ASSESSMENT AND PLAN:  PALPITATIONS:   Since she is no longer really bothered by these I will forego doing another attempt at a monitor.  She will let me know if they worsen in the future at which point I might screen her further.   MURMUR:    There was mild Ao valve thickening.    SEPTAL HYPERTROPHY: I will follow this clinically with a repeat echo in 1 year.  CHEST PAIN:    Given his exertional chest discomfort and her risk factors I think it be reasonable to screen her with a POET (Plain Old Exercise Treadmill) looking for high risk features.  It is a relatively stable pattern.  She needs aggressive continued risk reduction.   DM: I do see that her A1c was 8.6 and she was just started on Farxiga although she has not picked it up yet.  I will defer to Littie Deeds, MD   Current medicines are reviewed at length with the patient today.  The patient does not have concerns regarding medicines.  The following changes have been made:  None  Labs/ tests ordered today include:    Orders Placed This Encounter  Procedures   EXERCISE TOLERANCE TEST (ETT)     Disposition:   FU with me in 1 year or sooner based on the results of her stress test.   Signed, Rollene Rotunda, MD  01/07/2022 12:42 PM    [facility]

## 2022-01-07 ENCOUNTER — Ambulatory Visit: Payer: Medicare HMO | Admitting: Cardiology

## 2022-01-07 ENCOUNTER — Other Ambulatory Visit: Payer: Self-pay

## 2022-01-07 ENCOUNTER — Telehealth (HOSPITAL_COMMUNITY): Payer: Self-pay | Admitting: *Deleted

## 2022-01-07 ENCOUNTER — Encounter: Payer: Self-pay | Admitting: Cardiology

## 2022-01-07 DIAGNOSIS — R079 Chest pain, unspecified: Secondary | ICD-10-CM

## 2022-01-07 MED ORDER — EMPAGLIFLOZIN 10 MG PO TABS: 10.0000 mg | ORAL_TABLET | Freq: Every day | ORAL | 3 refills | Status: DC

## 2022-01-07 NOTE — Patient Instructions (Addendum)
Medication Instructions:  No Changes In Medications at this time.  *If you need a refill on your cardiac medications before your next appointment, please call your pharmacy*  Testing/Procedures: Your physician has requested that you have an exercise tolerance test. For further information please visit [email or web address] Please also follow instruction sheet, as given.  Your physician has requested that you have an exercise tolerance test. For further information please visit [email or web address] Please also follow instruction sheet, as given. This will take place at [address] [facility], [address]. Do not drink or eat foods with caffeine for 24 hours before the test. (Chocolate, coffee, tea, or energy drinks) If you use an inhaler, bring it with you to the test. Do not smoke for 4 hours before the test. Wear comfortable shoes and clothing.  Follow-Up: At [facility], you and your health needs are our priority.  As part of our continuing mission to provide you with exceptional heart care, we have created designated Provider Care Teams.  These Care Teams include your primary Cardiologist (physician) and Advanced Practice Providers (APPs -  Physician Assistants and Nurse Practitioners) who all work together to provide you with the care you need, when you need it.  We recommend signing up for the patient portal called "MyChart".  Sign up information is provided on this After Visit Summary.  MyChart is used to connect with patients for Virtual Visits (Telemedicine).  Patients are able to view lab/test results, encounter notes, upcoming appointments, etc.  Non-urgent messages can be sent to your provider as well.   To learn more about what you can do with MyChart, go to [email or web address].    Your next appointment:   1 year(s)  The format for your next appointment:   In Person  Provider:   Dr. Antoine Poche

## 2022-01-07 NOTE — Telephone Encounter (Signed)
Will refill to [facility] as requested. Is there anything we need to do on our end for her to get this medication?

## 2022-01-07 NOTE — Telephone Encounter (Signed)
Close encounter 

## 2022-01-07 NOTE — Addendum Note (Signed)
Addended by: Bea Laura B on: 01/07/2022 03:07 PM   Modules accepted: Orders

## 2022-01-07 NOTE — Telephone Encounter (Signed)
Patient calls nurse line requesting a Jardiance refill to [facility].   Patient reports she can no longer use the MAP program for this medication.   Please advise.

## 2022-01-10 ENCOUNTER — Other Ambulatory Visit: Payer: Self-pay

## 2022-01-10 DIAGNOSIS — S62645A Nondisplaced fracture of proximal phalanx of left ring finger, initial encounter for closed fracture: Secondary | ICD-10-CM

## 2022-01-10 MED ORDER — HYDROCHLOROTHIAZIDE 12.5 MG PO CAPS: 12.5000 mg | ORAL_CAPSULE | Freq: Every day | ORAL | 1 refills | Status: DC

## 2022-01-10 MED ORDER — INSULIN GLARGINE 100 UNIT/ML ~~LOC~~ SOLN: SUBCUTANEOUS | 0 refills | Status: DC

## 2022-01-10 MED ORDER — TRAMADOL HCL 50 MG PO TABS: 50.0000 mg | ORAL_TABLET | Freq: Two times a day (BID) | ORAL | 0 refills | Status: DC | PRN

## 2022-01-11 ENCOUNTER — Ambulatory Visit (HOSPITAL_COMMUNITY)
Admission: RE | Admit: 2022-01-11 | Payer: Medicare HMO | Source: Ambulatory Visit | Attending: Cardiology | Admitting: Cardiology

## 2022-01-13 NOTE — Addendum Note (Signed)
Addended by: Rollene Rotunda on: 01/13/2022 01:59 PM   Modules accepted: Orders

## 2022-01-17 ENCOUNTER — Other Ambulatory Visit: Payer: Self-pay

## 2022-01-18 ENCOUNTER — Telehealth (HOSPITAL_COMMUNITY): Payer: Self-pay | Admitting: *Deleted

## 2022-01-18 NOTE — Telephone Encounter (Signed)
Close encounter 

## 2022-01-19 ENCOUNTER — Ambulatory Visit (HOSPITAL_COMMUNITY)
Admission: RE | Admit: 2022-01-19 | Discharge: 2022-01-19 | Disposition: A | Discharge status: Home / Self Care | Payer: Medicare HMO | Source: Ambulatory Visit | Attending: Cardiology | Admitting: Cardiology

## 2022-01-19 ENCOUNTER — Other Ambulatory Visit: Payer: Self-pay

## 2022-01-19 DIAGNOSIS — R079 Chest pain, unspecified: Secondary | ICD-10-CM

## 2022-01-19 LAB — EXERCISE TOLERANCE TEST
Angina Index: 0
Duke Treadmill Score: 2
Estimated workload: 4.6
Exercise duration (min): 2 min
Exercise duration (sec): 3 s
MPHR: 156 {beats}/min
Peak HR: 144 {beats}/min
Percent HR: 92 %
Rest HR: 75 {beats}/min
ST Depression (mm): 0 mm

## 2022-01-19 MED ORDER — ACCU-CHEK GUIDE W/DEVICE KIT: PACK | 3 refills | Status: DC

## 2022-01-19 MED ORDER — ALCOHOL PREP PADS: MEDICATED_PAD | 0 refills | Status: DC

## 2022-01-20 MED ORDER — GLUCOSE BLOOD VI STRP: ORAL_STRIP | 5 refills | Status: DC

## 2022-01-20 MED ORDER — INSULIN GLARGINE 100 UNIT/ML ~~LOC~~ SOLN: SUBCUTANEOUS | 0 refills | Status: DC

## 2022-01-26 ENCOUNTER — Other Ambulatory Visit: Payer: Self-pay

## 2022-01-26 MED ORDER — ACCU-CHEK GUIDE VI STRP: ORAL_STRIP | 12 refills | Status: DC

## 2022-01-26 MED ORDER — "BD INSULIN SYRINGE ULTRAFINE 30G X 1/2"" 0.5 ML MISC": 1.0000 | Freq: Three times a day (TID) | 5 refills | Status: DC

## 2022-01-26 MED ORDER — MIRTAZAPINE 7.5 MG PO TABS: 7.5000 mg | ORAL_TABLET | Freq: Every day | ORAL | 2 refills | Status: DC

## 2022-01-26 NOTE — Telephone Encounter (Signed)
Medications refilled. She is no longer on Novolog. I can send in more Lantus if that is what she needs. ?

## 2022-01-26 NOTE — Telephone Encounter (Signed)
Prescriptions sent to local pharmacy, patient requested prescriptions to be sent to [facility] pharmacy. Canceled prescriptions at [facility] and resent to [facility].  ? ?Called patient and informed that she was no longer supposed to be taking Novolog.  ? ?Veronda Prude, RN ? ?

## 2022-01-26 NOTE — Telephone Encounter (Signed)
Patient calls nurse line requesting refill on insulin syringes, Novolog and mirtazapine. Pharmacy is also needing prescription for accu-chek guide strips. I have pended the medications to this encounter, however, I am unable to find Novolog on medication list.  ? ?Will forward to PCP.  ? ?Please advise.  ? ?Veronda Prude, RN ? ?

## 2022-01-31 ENCOUNTER — Other Ambulatory Visit: Payer: Self-pay | Admitting: Family Medicine

## 2022-02-04 ENCOUNTER — Other Ambulatory Visit: Payer: Self-pay | Admitting: Family Medicine

## 2022-02-15 ENCOUNTER — Telehealth: Payer: Self-pay

## 2022-02-15 NOTE — Telephone Encounter (Signed)
Mail order pharmacy calls nurse line reporting they received confirmation Tramadol 50mg  tablets were delivered at patients mailbox on 3/30. However, patient reports when she went to the mailbox the prescription was not there.  ? ?Pharmacist reports to dispense another controlled substance package they need a verbal ok.  ? ?Confirmed patient mailing information.  ? ?Will forward to PCP.  ? ? ?

## 2022-02-16 NOTE — Telephone Encounter (Signed)
Verbal given to ship out another shipment of Tramadol.  ?

## 2022-03-17 NOTE — Progress Notes (Signed)
? ? ?  SUBJECTIVE:  ? ?CHIEF COMPLAINT / HPI:  ?Chief Complaint  ?Patient presents with  ? Follow-up  ?  a1c  ?  ?Recent trip to [place] [facility] in February. Had a minor fall during that trip and hurt her toe which is better now. ? ?T2DM ?On Lantus 7 units daily, metformin 1000 mg BID. Started on empagliflozin 10 mg at last visit. ?Fasting sugars mostly 110s-120s. ?Denies symptomatic hypoglycemia. ? ?HTN ?On carvedilol 25 mg BID, lisinopril 40 mg. Started on HCTZ 12.5 mg last visit. Stopped it after taking it for a month due to itching which improved after stopping. ?Home readings mostly 130s/70s. ? ?Difficulty sleeping. Melatonin was helping but not anymore.  Still taking mirtazapine to help with decreased appetite.  Appetite is improved now. ? ?Requesting refill for tramadol.  She has chronic pain in her knees and lower back. ? ?PERTINENT  PMH / PSH: T2DM, HTN ? ?Patient Care Team: ?Littie Deeds, MD as PCP - General (Family Medicine) ?Rollene Rotunda, MD as PCP - Cardiology (Cardiology)  ? ?OBJECTIVE:  ? ?BP (!) 129/57   Pulse 85   Ht 5\' 4"  (1.626 m)   Wt 161 lb 4 oz (73.1 kg)   SpO2 99%   BMI 27.68 kg/m?   ?Physical Exam ?Constitutional:   ?   General: She is not in acute distress. ?HENT:  ?   Head: Normocephalic and atraumatic.  ?Cardiovascular:  ?   Rate and Rhythm: Normal rate and regular rhythm.  ?   Heart sounds: Murmur heard.  ?   Comments: Soft systolic murmur ?Pulmonary:  ?   Effort: Pulmonary effort is normal. No respiratory distress.  ?   Breath sounds: Normal breath sounds.  ?Musculoskeletal:  ?   Cervical back: Neck supple.  ?Neurological:  ?   Mental Status: She is alert.  ?  ? ? ?  03/18/2022  ?  2:58 PM  ?Depression screen PHQ 2/9  ?Decreased Interest 2  ?Down, Depressed, Hopeless 0  ?PHQ - 2 Score 2  ?Altered sleeping 3  ?Tired, decreased energy 0  ?Change in appetite 2  ?Feeling bad or failure about yourself  0  ?Trouble concentrating 0  ?Moving slowly or fidgety/restless 0  ?Suicidal thoughts 0   ?PHQ-9 Score 7  ?Difficult doing work/chores Not difficult at all  ?  ? ?Wt Readings from Last 3 Encounters:  ?03/18/22 161 lb 4 oz (73.1 kg)  ?01/07/22 163 lb 12.8 oz (74.3 kg)  ?11/30/21 159 lb 9.6 oz (72.4 kg)  ? ?  ? ?Last hemoglobin A1c ?Lab Results  ?Component Value Date  ? HGBA1C 8.5 (A) 03/18/2022  ? ?  ? ?ASSESSMENT/PLAN:  ? ?Chronic pain ?Refilled tramadol as requested.  Again advised to try to wean off medication. ? ?Type 2 diabetes mellitus without complications (HCC) ?A1c stable, not at goal.  Fasting CBGs at goal.  ?- increase empagliflozin to 25 mg ?- Continue on metformin and Lantus 7 units. ?- BMP today ?- will check BMP in 1 month, future order placed ? ?HYPERTENSION, BENIGN SYSTEMIC ?Adverse reaction (pruritus) to HCTZ.  BP appears to be at goal off of HCTZ. ?- d/c HCTZ ?- continue carvedilol, lisinopril ?  ?Hypomagnesemia ?On Mg supplementation ?- check Mg ? ?Return in about 3 months (around 06/18/2022) for f/u DM.  ? ?08/18/2022, MD ?[facility]  ?

## 2022-03-17 NOTE — Patient Instructions (Addendum)
It was nice seeing you today! ? ?Increase Jardiance to 25 mg. Next check in 3 months. ? ?Stop HCTZ. ? ?Check labs in 1 month. ? ?Stay well, ?Littie Deeds, MD ?[facility] ?[phone] ? ?-- ? ?Make sure to check out at the front desk before you leave today. ? ?Please arrive at least 15 minutes prior to your scheduled appointments. ? ?If you had blood work today, I will send you a MyChart message or a letter if results are normal. Otherwise, I will give you a call. ? ?If you had a referral placed, they will call you to set up an appointment. Please give [place] a call if you don't hear back in the next 2 weeks. ? ?If you need additional refills before your next appointment, please call your pharmacy first.  ?

## 2022-03-18 ENCOUNTER — Encounter: Payer: Self-pay | Admitting: Family Medicine

## 2022-03-18 ENCOUNTER — Ambulatory Visit (INDEPENDENT_AMBULATORY_CARE_PROVIDER_SITE_OTHER): Payer: Medicare HMO | Admitting: Family Medicine

## 2022-03-18 ENCOUNTER — Other Ambulatory Visit (HOSPITAL_COMMUNITY): Payer: Self-pay

## 2022-03-18 VITALS — BP 129/57 | HR 85 | Ht 64.0 in | Wt 161.2 lb

## 2022-03-18 DIAGNOSIS — E119 Type 2 diabetes mellitus without complications: Secondary | ICD-10-CM | POA: Diagnosis not present

## 2022-03-18 DIAGNOSIS — S62645A Nondisplaced fracture of proximal phalanx of left ring finger, initial encounter for closed fracture: Secondary | ICD-10-CM

## 2022-03-18 DIAGNOSIS — G8929 Other chronic pain: Secondary | ICD-10-CM

## 2022-03-18 DIAGNOSIS — Z794 Long term (current) use of insulin: Secondary | ICD-10-CM

## 2022-03-18 DIAGNOSIS — I10 Essential (primary) hypertension: Secondary | ICD-10-CM | POA: Diagnosis not present

## 2022-03-18 LAB — POCT GLYCOSYLATED HEMOGLOBIN (HGB A1C): HbA1c, POC (controlled diabetic range): 8.5 % — AB (ref 0.0–7.0)

## 2022-03-18 MED ORDER — CARVEDILOL 25 MG PO TABS: 25.0000 mg | ORAL_TABLET | Freq: Two times a day (BID) | ORAL | 2 refills | Status: DC

## 2022-03-18 MED ORDER — TRAMADOL HCL 50 MG PO TABS
50.0000 mg | ORAL_TABLET | Freq: Every day | ORAL | 0 refills | Status: DC | PRN
  Filled 2022-03-18: qty 30, 30d supply, fill #0

## 2022-03-18 MED ORDER — EMPAGLIFLOZIN 25 MG PO TABS
25.0000 mg | ORAL_TABLET | Freq: Every day | ORAL | 2 refills | Status: DC
Start: 2022-03-18 — End: 2022-06-20

## 2022-03-18 NOTE — Assessment & Plan Note (Signed)
Adverse reaction (pruritus) to HCTZ.  BP appears to be at goal off of HCTZ. ?- d/c HCTZ ?- continue carvedilol, lisinopril ?

## 2022-03-18 NOTE — Assessment & Plan Note (Signed)
Refilled tramadol as requested.  Again advised to try to wean off medication. ?

## 2022-03-18 NOTE — Assessment & Plan Note (Signed)
A1c stable, not at goal.  Fasting CBGs at goal.  ?- increase empagliflozin to 25 mg ?- Continue on metformin and Lantus 7 units. ?- BMP today ?- will check BMP in 1 month, future order placed ?

## 2022-03-19 ENCOUNTER — Other Ambulatory Visit: Payer: Self-pay | Admitting: Family Medicine

## 2022-03-19 LAB — BASIC METABOLIC PANEL
BUN/Creatinine Ratio: 13 (ref 12–28)
BUN: 15 mg/dL (ref 8–27)
CO2: 22 mmol/L (ref 20–29)
Calcium: 9.6 mg/dL (ref 8.7–10.3)
Chloride: 99 mmol/L (ref 96–106)
Creatinine, Ser: 1.19 mg/dL — ABNORMAL HIGH (ref 0.57–1.00)
Glucose: 275 mg/dL — ABNORMAL HIGH (ref 70–99)
Potassium: 4.9 mmol/L (ref 3.5–5.2)
Sodium: 139 mmol/L (ref 134–144)
eGFR: 51 mL/min/{1.73_m2} — ABNORMAL LOW (ref >59)

## 2022-03-19 LAB — MAGNESIUM: Magnesium: 1.6 mg/dL (ref 1.6–2.3)

## 2022-03-21 ENCOUNTER — Encounter: Payer: Self-pay | Admitting: Family Medicine

## 2022-04-06 ENCOUNTER — Other Ambulatory Visit: Payer: Self-pay | Admitting: Family Medicine

## 2022-04-06 ENCOUNTER — Other Ambulatory Visit: Payer: Self-pay

## 2022-04-06 DIAGNOSIS — S62645A Nondisplaced fracture of proximal phalanx of left ring finger, initial encounter for closed fracture: Secondary | ICD-10-CM

## 2022-04-06 MED ORDER — CARVEDILOL 25 MG PO TABS: 25.0000 mg | ORAL_TABLET | Freq: Two times a day (BID) | ORAL | 2 refills | Status: DC

## 2022-04-13 ENCOUNTER — Telehealth: Payer: Self-pay | Admitting: *Deleted

## 2022-04-13 ENCOUNTER — Other Ambulatory Visit: Payer: Self-pay

## 2022-04-14 ENCOUNTER — Ambulatory Visit (INDEPENDENT_AMBULATORY_CARE_PROVIDER_SITE_OTHER): Payer: Medicare HMO | Admitting: Family Medicine

## 2022-04-14 VITALS — BP 160/90 | HR 64 | Ht 64.0 in | Wt 160.0 lb

## 2022-04-14 DIAGNOSIS — Z794 Long term (current) use of insulin: Secondary | ICD-10-CM | POA: Diagnosis not present

## 2022-04-14 DIAGNOSIS — R300 Dysuria: Secondary | ICD-10-CM | POA: Diagnosis not present

## 2022-04-14 DIAGNOSIS — E119 Type 2 diabetes mellitus without complications: Secondary | ICD-10-CM | POA: Diagnosis not present

## 2022-04-14 MED ORDER — INSULIN GLARGINE 100 UNIT/ML ~~LOC~~ SOLN: SUBCUTANEOUS | 2 refills | Status: DC

## 2022-04-14 NOTE — Telephone Encounter (Signed)
**  Late Entry**  Pt called RN line yesterday and wanted to let Dr. Nancy Fetter know that she does not want to take Jardiance anymore.  Since increasing her sugar has dropped to 24 ( called EMS) and she feels like she has UTI symptoms.   To PCP to decide next steps with Jardiance.  Appt with ATC for UTI symptoms.   If the patient needs:   An appointment - route response to [facility] admin  A callback from clinic staff - route response to your team   Only route to RN Team: form completions or if RN team directly requests response sent to them   Christen Bame, CMA

## 2022-04-14 NOTE — Patient Instructions (Addendum)
It was great seeing you today!  Today we discussed your diabetes and urinary symptoms. Sometimes you can get a urinary tract infection from the jardiance but this is still important to take for your diabetes control. I will let you know of the results from the urine sample today and will give you medication if you have a urinary tract infection.  For your diabetes continue to take metformin 1000 mg twice daily and jardiance 25 mg daily. TAKE LANTUS 4 UNITS AND DECREASE BY 1 UNIT FOR GLUCOSE LEVEL LESS THAN 110.   Please follow up at your next scheduled appointment with Dr. Wynelle Link on 6/6 at 3:10 pm, if anything arises between now and then, please don't hesitate to contact our office.   Thank you for allowing [place] to be a part of your medical care!  Thank you, Dr. Robyne Peers

## 2022-04-14 NOTE — Progress Notes (Signed)
    SUBJECTIVE:   CHIEF COMPLAINT / HPI:   Patient presents with recent hypoglycemic episode. She went to celebrate her daughter's birthday and had an alcoholic beverage and hot dog. That day she did not eat much. The next day her glucose level was 24, she does not recall this until her sister woke up and felt better after she was able to eat a sandwich. Otherwise her glucose levels have ranged between 90-160s. Saw PCP, Dr. Nancy Fetter, earlier this month who recommended increase in jardiance to 25 mg. Reports compliance on diabetic regimen. Had dysuria a few days ago but says that this has improved and still feels some discomfort. Denies any vaginal discharge or bleeding. No changes in urinary frequency. Denies fever, chills, rash and pelvic pain. Has taken lantus 7 units already this morning, glucose level was 132. Denies any dizziness or other symptoms since Sunday.   OBJECTIVE:   BP (!) 160/90   Pulse 64   Ht 5\' 4"  (1.626 m)   Wt 160 lb (72.6 kg)   BMI 27.46 kg/m   General: Patient well-appearing, in no acute distress.  CV: RRR, no murmurs or gallops auscultated Resp: CTAB, no wheezing, rales or rhonchi noted Ext: no LE edema noted bilaterally   ASSESSMENT/PLAN:   Type 2 diabetes mellitus without complications (HCC) -hypoglycemic episode likely secondary to poor oral intake at the time, no further episodes since then.  -after extensive discussion and shared decision making decided to continue jardiance 25 mg daily along with metformin 1000 mg bid. Will decrease lantus to 4 units and instructed to decrease by 1 unit each day that glucose level is less than 110. Patient voices understanding and agreeable to plan in place -instructed to record glucose levels 2-3 times daily and bring this to next visit -follow up with Dr. Nancy Fetter on 6/6 for further DM management to make changes as appropriate   Dysuria -UA pending, will treat as appropriate if patient has UTI -of note patient on jardiance which I  discussed with her can be common to have the symptoms she previously had but it is still highly beneficial to continue jardiance for better glycemic control, she agreed as well  Elevated BP  -noted to be elevated during visit, reassuringly patient is asymptomatic -continue coreg 25 mg bid, encouraged to monitor BP at home  -ED precautions provided  -patient to follow up with Dr. Nancy Fetter early next week and will recheck at this time    -PHQ-9 score of 7 with negative question 9 reviewed.    Donney Dice, [place]

## 2022-04-14 NOTE — Assessment & Plan Note (Signed)
-  hypoglycemic episode likely secondary to poor oral intake at the time, no further episodes since then.  -after extensive discussion and shared decision making decided to continue jardiance 25 mg daily along with metformin 1000 mg bid. Will decrease lantus to 4 units and instructed to decrease by 1 unit each day that glucose level is less than 110. Patient voices understanding and agreeable to plan in place -instructed to record glucose levels 2-3 times daily and bring this to next visit -follow up with Dr. Wynelle Link on 6/6 for further DM management to make changes as appropriate

## 2022-04-14 NOTE — Telephone Encounter (Signed)
I would like her to continue Jardiance if she is willing. We could go back to the 10 mg dose if she really does not want to take the higher dose.  If she continues on the [place], we can decrease her Lantus to 4 units and would have her decrease her daily dose by 1 unit every time she has a fasting sugar below 110.  Otherwise, I would have her follow-up with me to discuss next steps.  Will forward to provider seeing her today.

## 2022-04-14 NOTE — Assessment & Plan Note (Signed)
-  UA pending, will treat as appropriate if patient has UTI -of note patient on jardiance which I discussed with her can be common to have the symptoms she previously had but it is still highly beneficial to continue jardiance for better glycemic control, she agreed as well

## 2022-04-15 LAB — URINALYSIS
Bilirubin, UA: NEGATIVE
Ketones, UA: NEGATIVE
Leukocytes,UA: NEGATIVE
Nitrite, UA: NEGATIVE
Protein,UA: NEGATIVE
RBC, UA: NEGATIVE
Specific Gravity, UA: >=1.03 — AB (ref 1.005–1.030)
Urobilinogen, Ur: 0.2 mg/dL (ref 0.2–1.0)
pH, UA: 7 (ref 5.0–7.5)

## 2022-04-15 NOTE — Telephone Encounter (Signed)
Pt was seen 04/14/22 and was addressed. Jone Baseman, CMA

## 2022-04-18 ENCOUNTER — Other Ambulatory Visit (HOSPITAL_COMMUNITY): Payer: Self-pay

## 2022-04-18 ENCOUNTER — Other Ambulatory Visit: Payer: Self-pay | Admitting: Family Medicine

## 2022-04-18 DIAGNOSIS — S62645A Nondisplaced fracture of proximal phalanx of left ring finger, initial encounter for closed fracture: Secondary | ICD-10-CM

## 2022-04-18 MED ORDER — TRAMADOL HCL 50 MG PO TABS
50.0000 mg | ORAL_TABLET | Freq: Every day | ORAL | 0 refills | Status: DC | PRN
  Filled 2022-04-18: qty 30, 30d supply, fill #0

## 2022-04-19 ENCOUNTER — Ambulatory Visit (INDEPENDENT_AMBULATORY_CARE_PROVIDER_SITE_OTHER): Payer: Medicare HMO | Admitting: Family Medicine

## 2022-04-19 ENCOUNTER — Other Ambulatory Visit (HOSPITAL_COMMUNITY): Payer: Self-pay

## 2022-04-19 VITALS — BP 131/72 | HR 80 | Ht 64.0 in | Wt 160.0 lb

## 2022-04-19 DIAGNOSIS — I10 Essential (primary) hypertension: Secondary | ICD-10-CM | POA: Diagnosis not present

## 2022-04-19 DIAGNOSIS — Z794 Long term (current) use of insulin: Secondary | ICD-10-CM

## 2022-04-19 DIAGNOSIS — E1159 Type 2 diabetes mellitus with other circulatory complications: Secondary | ICD-10-CM

## 2022-04-19 DIAGNOSIS — I152 Hypertension secondary to endocrine disorders: Secondary | ICD-10-CM | POA: Diagnosis not present

## 2022-04-19 DIAGNOSIS — E119 Type 2 diabetes mellitus without complications: Secondary | ICD-10-CM | POA: Diagnosis not present

## 2022-04-19 NOTE — Progress Notes (Signed)
    SUBJECTIVE:   CHIEF COMPLAINT / HPI:  Chief Complaint  Patient presents with   Follow-up    Seen in clinic last week had a hypoglycemic episode and UTI symptoms. UA not indicative of infection. Continued on empagliflozin 25 mg and Lantus was decreased to 4 units with plan to decrease by 1 unit every time her fasting sugar is <110. Continued on metformin. Instructed to monitor CBG 2-3 times daily.  Patient reports hypoglycemic episode last week happened after she was out celebrating her oldest daughter's birthday. She was at the club that night and had had a few drinks and did not eat much.  She has brought her CBG log which is scanned into the chart.  Taking OTC magnesium supplement 500 mg daily.  PERTINENT  PMH / PSH: T2DM, HTN, TIA  Patient Care Team: Littie Deeds, MD as PCP - General (Family Medicine) Rollene Rotunda, MD as PCP - Cardiology (Cardiology)   OBJECTIVE:   BP 131/72   Pulse 80   Ht 5\' 4"  (1.626 m)   Wt 160 lb (72.6 kg)   SpO2 99%   BMI 27.46 kg/m   Physical Exam Constitutional:      General: She is not in acute distress. Cardiovascular:     Rate and Rhythm: Normal rate and regular rhythm.  Pulmonary:     Effort: Pulmonary effort is normal. No respiratory distress.     Breath sounds: Normal breath sounds.  Musculoskeletal:     Cervical back: Neck supple.  Neurological:     Mental Status: She is alert.        04/19/2022    3:07 PM  Depression screen PHQ 2/9  Decreased Interest 3  Down, Depressed, Hopeless 0  PHQ - 2 Score 3  Altered sleeping 2  Tired, decreased energy 0  Change in appetite 0  Feeling bad or failure about yourself  0  Trouble concentrating 0  Moving slowly or fidgety/restless 0  Suicidal thoughts 0  PHQ-9 Score 5     {Show previous vital signs (optional):23777}    ASSESSMENT/PLAN:   Type 2 diabetes mellitus without complications (HCC) Still having fairly low fasting sugars on reduced dose of Lantus, will discontinue  altogether for now. Continue metformin and SGLT2i.  Hypertension associated with diabetes (HCC) Recheck BMP and Mg (on supplementation). Last Cr mildly elevated.   HCM - shingles vaccine due, patient will think about it - discussed and info provided - DXA scan - discussed and info provided - DM eye exam due, pt will call for appt with [facility]  Return in about 2 months (around 06/19/2022) for f/u DM.   08/19/2022, MD [facility]

## 2022-04-19 NOTE — Assessment & Plan Note (Signed)
Recheck BMP and Mg (on supplementation). Last Cr mildly elevated.

## 2022-04-19 NOTE — Patient Instructions (Addendum)
It was nice seeing you today!  Stop taking the Lantus. Continue the Jardiance and metformin.  Continue checking your blood sugar every morning.  Call to get your diabetic eye exam and have them send records to me.  Think about the shingles vaccine and DEXA scan for osteoporosis screening.  Next diabetes checkup in 2 months.  Stay well, Littie Deeds, MD [facility] [phone]  --  Make sure to check out at the front desk before you leave today.  Please arrive at least 15 minutes prior to your scheduled appointments.  If you had blood work today, I will send you a MyChart message or a letter if results are normal. Otherwise, I will give you a call.  If you had a referral placed, they will call you to set up an appointment. Please give [place] a call if you don't hear back in the next 2 weeks.  If you need additional refills before your next appointment, please call your pharmacy first.

## 2022-04-19 NOTE — Assessment & Plan Note (Signed)
Still having fairly low fasting sugars on reduced dose of Lantus, will discontinue altogether for now. Continue metformin and SGLT2i.

## 2022-04-20 ENCOUNTER — Encounter: Payer: Self-pay | Admitting: Family Medicine

## 2022-04-20 LAB — BASIC METABOLIC PANEL
BUN/Creatinine Ratio: 21 (ref 12–28)
BUN: 15 mg/dL (ref 8–27)
CO2: 24 mmol/L (ref 20–29)
Calcium: 9.3 mg/dL (ref 8.7–10.3)
Chloride: 97 mmol/L (ref 96–106)
Creatinine, Ser: 0.73 mg/dL (ref 0.57–1.00)
Glucose: 273 mg/dL — ABNORMAL HIGH (ref 70–99)
Potassium: 4.5 mmol/L (ref 3.5–5.2)
Sodium: 135 mmol/L (ref 134–144)
eGFR: 91 mL/min/{1.73_m2} (ref >59)

## 2022-04-20 LAB — MAGNESIUM: Magnesium: 1.5 mg/dL — ABNORMAL LOW (ref 1.6–2.3)

## 2022-04-21 ENCOUNTER — Other Ambulatory Visit: Payer: Medicare HMO

## 2022-05-13 ENCOUNTER — Other Ambulatory Visit (HOSPITAL_COMMUNITY): Payer: Self-pay

## 2022-05-13 ENCOUNTER — Other Ambulatory Visit: Payer: Self-pay | Admitting: Family Medicine

## 2022-05-13 DIAGNOSIS — S62645A Nondisplaced fracture of proximal phalanx of left ring finger, initial encounter for closed fracture: Secondary | ICD-10-CM

## 2022-05-14 ENCOUNTER — Other Ambulatory Visit (HOSPITAL_COMMUNITY): Payer: Self-pay

## 2022-05-18 ENCOUNTER — Other Ambulatory Visit (HOSPITAL_COMMUNITY): Payer: Self-pay

## 2022-05-18 ENCOUNTER — Other Ambulatory Visit: Payer: Self-pay | Admitting: Family Medicine

## 2022-05-18 DIAGNOSIS — S62645A Nondisplaced fracture of proximal phalanx of left ring finger, initial encounter for closed fracture: Secondary | ICD-10-CM

## 2022-05-18 MED ORDER — TRAMADOL HCL 50 MG PO TABS
50.0000 mg | ORAL_TABLET | Freq: Every day | ORAL | 0 refills | Status: DC | PRN
  Filled 2022-05-18: qty 30, 30d supply, fill #0

## 2022-06-08 ENCOUNTER — Other Ambulatory Visit: Payer: Self-pay | Admitting: Family Medicine

## 2022-06-15 ENCOUNTER — Other Ambulatory Visit: Payer: Self-pay | Admitting: Family Medicine

## 2022-06-15 DIAGNOSIS — S62645A Nondisplaced fracture of proximal phalanx of left ring finger, initial encounter for closed fracture: Secondary | ICD-10-CM

## 2022-06-16 ENCOUNTER — Other Ambulatory Visit: Payer: Self-pay | Admitting: Family Medicine

## 2022-06-16 ENCOUNTER — Other Ambulatory Visit (HOSPITAL_COMMUNITY): Payer: Self-pay

## 2022-06-16 DIAGNOSIS — S62645A Nondisplaced fracture of proximal phalanx of left ring finger, initial encounter for closed fracture: Secondary | ICD-10-CM

## 2022-06-17 ENCOUNTER — Other Ambulatory Visit (HOSPITAL_COMMUNITY): Payer: Self-pay

## 2022-06-18 ENCOUNTER — Other Ambulatory Visit: Payer: Self-pay | Admitting: Family Medicine

## 2022-07-14 ENCOUNTER — Other Ambulatory Visit: Payer: Self-pay | Admitting: Family Medicine

## 2022-07-15 ENCOUNTER — Ambulatory Visit (INDEPENDENT_AMBULATORY_CARE_PROVIDER_SITE_OTHER): Payer: Medicare HMO | Admitting: Family Medicine

## 2022-07-15 ENCOUNTER — Encounter: Payer: Self-pay | Admitting: Family Medicine

## 2022-07-15 VITALS — BP 118/78 | HR 78 | Ht 64.0 in | Wt 152.0 lb

## 2022-07-15 DIAGNOSIS — G8929 Other chronic pain: Secondary | ICD-10-CM

## 2022-07-15 DIAGNOSIS — Z794 Long term (current) use of insulin: Secondary | ICD-10-CM

## 2022-07-15 DIAGNOSIS — E119 Type 2 diabetes mellitus without complications: Secondary | ICD-10-CM

## 2022-07-15 DIAGNOSIS — S62645A Nondisplaced fracture of proximal phalanx of left ring finger, initial encounter for closed fracture: Secondary | ICD-10-CM

## 2022-07-15 LAB — POCT GLYCOSYLATED HEMOGLOBIN (HGB A1C): HbA1c, POC (controlled diabetic range): 10.6 % — AB (ref 0.0–7.0)

## 2022-07-15 MED ORDER — TRAMADOL HCL 50 MG PO TABS
50.0000 mg | ORAL_TABLET | Freq: Every day | ORAL | 0 refills | Status: DC | PRN
Start: 2022-07-15 — End: 2022-12-02

## 2022-07-15 MED ORDER — SEMAGLUTIDE(0.25 OR 0.5MG/DOS) 2 MG/1.5ML ~~LOC~~ SOPN: 0.2500 mg | PEN_INJECTOR | SUBCUTANEOUS | 0 refills | Status: DC

## 2022-07-15 NOTE — Patient Instructions (Addendum)
It was nice seeing you today!  Start Ozempic 1 injection a week.  Schedule a visit with pharmacy clinic, Dr. Raymondo Band in 1 month.  See me in 3 months.  Stay well, Littie Deeds, MD [facility] [phone]  --  Make sure to check out at the front desk before you leave today.  Please arrive at least 15 minutes prior to your scheduled appointments.  If you had blood work today, I will send you a MyChart message or a letter if results are normal. Otherwise, I will give you a call.  If you had a referral placed, they will call you to set up an appointment. Please give [place] a call if you don't hear back in the next 2 weeks.  If you need additional refills before your next appointment, please call your pharmacy first.

## 2022-07-15 NOTE — Progress Notes (Signed)
    SUBJECTIVE:   CHIEF COMPLAINT / HPI:  Chief Complaint  Patient presents with   Diabetes    T2DM Takes metformin 1000 mg BID and empagliflozin 25 mg. Fasting sugars 140s-150s. Notices spikes in the 200s after eating Mostly drinks water.  Reports she has not had any significant changes in her diet since last visit.  Appetite is back to normal, improved with mirtazapine.  Does not feel like she is overeating. Willing to try injectable medication.  Patient reports she rarely takes tramadol now, currently only taking about twice a week for chronic back and knee pain.  She wants 1 more refill of tramadol and will agree to stop the medication after that.  PERTINENT  PMH / PSH: T2DM, HTN, TIA  Patient Care Team: Littie Deeds, MD as PCP - General (Family Medicine) Rollene Rotunda, MD as PCP - Cardiology (Cardiology)   OBJECTIVE:   BP 118/78   Pulse 78   Ht 5\' 4"  (1.626 m)   Wt 152 lb (68.9 kg)   SpO2 100%   BMI 26.09 kg/m   Physical Exam Constitutional:      General: She is not in acute distress. Cardiovascular:     Rate and Rhythm: Normal rate and regular rhythm.  Pulmonary:     Effort: Pulmonary effort is normal. No respiratory distress.     Breath sounds: Normal breath sounds.  Musculoskeletal:     Cervical back: Neck supple.  Neurological:     Mental Status: She is alert.         07/15/2022    2:23 PM  Depression screen PHQ 2/9  Decreased Interest 2  Down, Depressed, Hopeless 0  PHQ - 2 Score 2  Altered sleeping 3  Tired, decreased energy 2  Change in appetite 0  Feeling bad or failure about yourself  0  Trouble concentrating 0  Moving slowly or fidgety/restless 0  Suicidal thoughts 0  PHQ-9 Score 7     Wt Readings from Last 3 Encounters:  07/15/22 152 lb (68.9 kg)  04/19/22 160 lb (72.6 kg)  04/14/22 160 lb (72.6 kg)      Last hemoglobin A1c Lab Results  Component Value Date   HGBA1C 10.6 (A) 07/15/2022      ASSESSMENT/PLAN:   Chronic  pain 1 more refill of tramadol provided, will wean to once daily as needed.  Patient reports she will not need medication after 1 more refill.  Type 2 diabetes mellitus without complications (HCC) Uncontrolled, worsening after discontinuation of Lantus at previous visit.  Hesitant to restart basal insulin as fasting sugars appear to be fairly well controlled.  Will initiate GLP-1 agonist, patient counseled and in agreement. - continue metformin 1000 mg BID - continue empagliflozin 25 mg - start semaglutide 0.25 mg weekly - f/u rx clinic 1 month to assist with GLP-1 titration   HCM - DXA scan previously ordered, number provided so patient can schedule - DM eye exam scheduled next month - pt plans to receive shingles vaccine when she works up encourage  Return in about 3 months (around 10/14/2022) for f/u DM.   14/11/2021, MD [facility]

## 2022-07-15 NOTE — Assessment & Plan Note (Addendum)
Uncontrolled, worsening after discontinuation of Lantus at previous visit.  Hesitant to restart basal insulin as fasting sugars appear to be fairly well controlled.  Will initiate GLP-1 agonist, patient counseled and in agreement. - continue metformin 1000 mg BID - continue empagliflozin 25 mg - start semaglutide 0.25 mg weekly - f/u rx clinic 1 month to assist with GLP-1 titration

## 2022-07-15 NOTE — Assessment & Plan Note (Signed)
1 more refill of tramadol provided, will wean to once daily as needed.  Patient reports she will not need medication after 1 more refill.

## 2022-07-19 ENCOUNTER — Telehealth: Payer: Self-pay

## 2022-07-19 DIAGNOSIS — E119 Type 2 diabetes mellitus without complications: Secondary | ICD-10-CM

## 2022-07-19 NOTE — Telephone Encounter (Signed)
Patient calls nurse line regarding concerns with starting Ozempic. She is worried regarding side effects including nausea and weight loss.   She reports that jardiance has also caused her to lose weight and is concerned that the Ozempic will cause her to lose too much weight. She has not yet started the Ozempic. She is requesting another medication to help manage diabetes that will not cause her to lose weight.   Will forward to PCP for further advisement.   Veronda Prude, RN

## 2022-07-20 MED ORDER — SITAGLIPTIN PHOSPHATE 100 MG PO TABS: 100.0000 mg | ORAL_TABLET | Freq: Every day | ORAL | 2 refills | Status: DC

## 2022-07-20 NOTE — Telephone Encounter (Signed)
Called patient.  Patient does not want to start Ozempic due to concern for weight loss.  Could restart low-dose basal insulin or trial DPP 4 inhibitor such as Sitagliptin.  Patient opted to try Sitagliptin.  Rx sent in.

## 2022-08-15 ENCOUNTER — Ambulatory Visit: Payer: Medicaid Other | Admitting: Pharmacist

## 2022-08-18 ENCOUNTER — Ambulatory Visit (HOSPITAL_BASED_OUTPATIENT_CLINIC_OR_DEPARTMENT_OTHER): Payer: Medicare HMO | Admitting: Pharmacist

## 2022-08-18 VITALS — BP 119/67 | HR 99 | Ht 65.0 in | Wt 153.2 lb

## 2022-08-18 DIAGNOSIS — I152 Hypertension secondary to endocrine disorders: Secondary | ICD-10-CM | POA: Diagnosis not present

## 2022-08-18 DIAGNOSIS — E119 Type 2 diabetes mellitus without complications: Secondary | ICD-10-CM

## 2022-08-18 DIAGNOSIS — E1159 Type 2 diabetes mellitus with other circulatory complications: Secondary | ICD-10-CM

## 2022-08-18 DIAGNOSIS — Z23 Encounter for immunization: Secondary | ICD-10-CM | POA: Diagnosis not present

## 2022-08-18 MED ORDER — VALSARTAN 160 MG PO TABS: 160.0000 mg | ORAL_TABLET | Freq: Every day | ORAL | 3 refills | Status: DC

## 2022-08-18 NOTE — Patient Instructions (Addendum)
It was nice to see you today!  Your goal blood sugar is 80-130 before eating and less than 180 after eating.  Medication Changes: START the Ozempic (semaglutide) 0.25 mg weekly.  Take lisinopril once daily until finished, then start taking valsartan 160 mg daily.  STOP taking the Januvia (sitagliptan).  Monitor blood sugars at home and keep a log (glucometer or piece of paper) to bring with you to your next visit.  Keep up the good work with diet and exercise. Aim for a diet full of vegetables, fruit and lean meats (chicken, [place], fish). Try to limit salt intake by eating fresh or frozen vegetables (instead of canned), rinse canned vegetables prior to cooking and do not add any additional salt to meals.

## 2022-08-18 NOTE — Assessment & Plan Note (Signed)
Hypertension longstanding currently at goal based on office BP. Blood pressure goal of <130/80 mmHg. Medication adherence appropriate; however patient agreeable to switch from ACE inhibitor to ARB to decrease risk of angioedema.  -Stop lisinopril 40 mg daily once patient's supply has run out (supply may last for next 3 months).  -Start valsartan 160 mg daily once out of lisinopril.

## 2022-08-18 NOTE — Progress Notes (Signed)
Reviewed: I agree with Dr. Koval's documentation and management. 

## 2022-08-18 NOTE — Progress Notes (Addendum)
S:    Chief Complaint  Patient presents with   Medication Management    Diabetes Follow-up   [name] is a 66 y.o. female who presents for diabetes evaluation, education, and management. PMH is significant for T2DM, HTN, and history of TIA. Patient was referred and last seen by Primary Care Provider, Dr. Wynelle Link, on 07/15/2022.   At last visit with PCP, patient was started on Ozempic (semaglutide) 0.25 mg daily; however, called a few days later expressing concern regarding Ozempic side effects including nausea and weight loss. Reports she does has issues with over eating.   Today, patient arrives in good spirits and presents without any assistance. States she recently started and has been taking Januvia (sitagliptin) for ~1 week and has not noticed a change in her blood sugar (still mostly 200s).   Current diabetes medications include: Jardiance (empagliflozin) 25 mg daily, metformin 1000 mg BID, Januvia (sitagliptin) 100 mg daily Current hypertension medications include: carvedilol 25 mg BID, lisinopril 40 mg daily Current hyperlipidemia medications include: atorvastatin 40 mg daily  Patient reports adherence to taking all medications as prescribed.   Insurance coverage: Medicaid and Medicare  Patient denies hypoglycemic events.  Reported home fasting blood sugars: mostly 200s   O:  Review of Systems  All other systems reviewed and are negative.   Physical Exam Constitutional:      Appearance: Normal appearance.  Pulmonary:     Effort: Pulmonary effort is normal.  Neurological:     Mental Status: She is alert.  Psychiatric:        Mood and Affect: Mood normal.        Behavior: Behavior normal.    7 day average blood glucose: 220 mg/dL 14 day average: 235 mg/dL 30 day average: 573 mg/dL 90 day average: 220 mg/dL   Lab Results  Component Value Date   HGBA1C 10.6 (A) 07/15/2022   Vitals:   08/18/22 1052 08/18/22 1110  BP: (!) 140/82 119/67  Pulse:  99   SpO2:  100%    Lipid Panel     Component Value Date/Time   CHOL 114 10/27/2021 1104   TRIG 84 10/27/2021 1104   HDL 53 10/27/2021 1104   CHOLHDL 2.2 10/27/2021 1104   CHOLHDL 1.6 06/09/2016 1012   VLDL 15 06/09/2016 1012   LDLCALC 44 10/27/2021 1104   LDLDIRECT 83 04/06/2020 1447   LDLDIRECT 62 05/16/2007 2138    Clinical Atherosclerotic Cardiovascular Disease (ASCVD): Yes - history of TIA   A/P: Diabetes longstanding, currently above goal based on A1c. Patient is able to verbalize appropriate hypoglycemia management plan. Medication adherence appears appropriate. Control is suboptimal due to elevated post prandial glucoses. After discussing the risk vs. benefit of Ozempic, patient is agreeable to start.  -Started GLP-1 Ozempic (semaglutide) at 0.25 mg weekly. -Stop DPP-4 inhibitors Januvia (Sitagliptin).  Collected from patient and will discard to prevent confusion with Jardiance.  Patient willing for [place] to remove and destroy Januvia - #13 taken and sent for destruction. -Continued SGLT2-I Jardiance (empagliflozin) 25 mg daily.  -Continued metformin 1000 mg BID. -Patient educated on purpose, proper use, and potential adverse effects of Ozempic. Prescription is already at patient's pharmacy.  -Extensively discussed pathophysiology of diabetes, recommended lifestyle interventions, dietary effects on blood sugar control.  -Counseled on s/sx of and management of hypoglycemia.  -Next A1c anticipated December 2023.   Hypertension longstanding currently at goal based on office BP. Blood pressure goal of <130/80 mmHg. Medication adherence appropriate; however patient  agreeable to switch from ACE inhibitor to ARB to decrease risk of angioedema.  -Stop lisinopril 40 mg daily once patient's supply has run out (supply may last for next 3 months).  -Start valsartan 160 mg daily once out of lisinopril.   Patient requested and received influenza vaccination  - administered by nursing staff.    Written patient instructions provided. Patient verbalized understanding of treatment plan.  Total time in face to face counseling 33 minutes.    Follow-up:  Pharmacist 1 month. Patient seen with Geraldo Docker, PharmD Candidate, Garrel Ridgel PharmD Candidate.and Joseph Art, PharmD, PGY2 Pharmacy Resident.

## 2022-08-18 NOTE — Assessment & Plan Note (Signed)
Diabetes longstanding, currently above goal based on A1c. Patient is able to verbalize appropriate hypoglycemia management plan. Medication adherence appears appropriate. Control is suboptimal due to elevated post prandial glucoses. After discussing the risk vs. benefit of Ozempic, patient is agreeable to start.  -Started GLP-1 Ozempic (semaglutide) at 0.25 mg weekly. -Stop DPP-4 inhibitors Januvia (Sitagliptin).  Collected from patient and will discard to prevent confusion with Jardiance.  -Continued SGLT2-I Jardiance (empagliflozin) 25 mg daily.  -Continued metformin 1000 mg BID. -Patient educated on purpose, proper use, and potential adverse effects of Ozempic. Prescription is already at patient's pharmacy.  -Extensively discussed pathophysiology of diabetes, recommended lifestyle interventions, dietary effects on blood sugar control.  -Counseled on s/sx of and management of hypoglycemia.  -Next A1c anticipated December 2023.

## 2022-08-23 ENCOUNTER — Other Ambulatory Visit: Payer: Self-pay | Admitting: Family Medicine

## 2022-08-24 ENCOUNTER — Other Ambulatory Visit (HOSPITAL_COMMUNITY): Payer: Self-pay

## 2022-08-26 ENCOUNTER — Telehealth: Payer: Self-pay

## 2022-08-26 NOTE — Telephone Encounter (Signed)
A Prior Authorization was initiated for this patients OZEMPIC through CoverMyMeds.   Key: [id number]

## 2022-08-26 NOTE — Telephone Encounter (Signed)
Prior Auth for patients medication OZEMPIC approved by [employer] from 08/26/22 to 11/14/23.  Key: [id number]

## 2022-08-29 ENCOUNTER — Other Ambulatory Visit: Payer: Self-pay | Admitting: Family Medicine

## 2022-08-29 DIAGNOSIS — Z1231 Encounter for screening mammogram for malignant neoplasm of breast: Secondary | ICD-10-CM

## 2022-09-01 ENCOUNTER — Ambulatory Visit
Admission: RE | Admit: 2022-09-01 | Discharge: 2022-09-01 | Disposition: A | Discharge status: Home / Self Care | Payer: Medicaid Other | Source: Ambulatory Visit

## 2022-09-01 DIAGNOSIS — Z1231 Encounter for screening mammogram for malignant neoplasm of breast: Secondary | ICD-10-CM | POA: Diagnosis not present

## 2022-09-07 ENCOUNTER — Other Ambulatory Visit: Payer: Self-pay | Admitting: Family Medicine

## 2022-09-08 ENCOUNTER — Ambulatory Visit (INDEPENDENT_AMBULATORY_CARE_PROVIDER_SITE_OTHER): Payer: Medicare HMO | Admitting: Pharmacist

## 2022-09-08 VITALS — BP 123/77 | HR 94 | Wt 149.8 lb

## 2022-09-08 DIAGNOSIS — E1159 Type 2 diabetes mellitus with other circulatory complications: Secondary | ICD-10-CM | POA: Diagnosis not present

## 2022-09-08 DIAGNOSIS — E119 Type 2 diabetes mellitus without complications: Secondary | ICD-10-CM | POA: Diagnosis not present

## 2022-09-08 DIAGNOSIS — I152 Hypertension secondary to endocrine disorders: Secondary | ICD-10-CM

## 2022-09-08 NOTE — Progress Notes (Signed)
S:    Chief Complaint  Patient presents with   Medication Management    DM follow-up   66 y.o. female who presents for diabetes evaluation, education, and management.  PMH is significant for T2DM, HTN, and history of TIA.  Patient was last seen in Rx clinic on 08/18/22. Patient was last seen by Primary Care Provider, Dr. Wynelle Link, on 07/15/22.  At last visit, initiated valsartan (discontinued lisinopril). Discussed Ozempic however patient did not start this medication.   Today, patient arrives in good spirits and presents without any assistance. States she has not started Ozempic due to concerns over side effects. Endorses has started valsartan and tolerated it well. Denies dizziness, lightheadedness, chest pain, or headache.   Current diabetes medications include: Jardiance (empagliflozin) 25 mg daily, metformin 1000 mg BID, Ozempic (semaglutide) 0.25 mg weekly (not started) Current hypertension medications include: Valsartan 160 mg daily, carvedilol 25 mg BID Current hyperlipidemia medications include: atorvastatin 40 mg daily  Patient reports adherence to taking all medications as prescribed. However, has not started Ozempic.   Insurance coverage: [employer] Medicare, Medicaid  Home blood pressure readings: all readings <130/70s  Patient denies hypoglycemic events.  Reported home fasting blood sugars: 100-200s    O:   Review of Systems  All other systems reviewed and are negative.   Physical Exam Constitutional:      Appearance: Normal appearance.  Pulmonary:     Effort: Pulmonary effort is normal.  Neurological:     Mental Status: She is alert.  Psychiatric:        Mood and Affect: Mood normal.        Behavior: Behavior normal.    Lab Results  Component Value Date   HGBA1C 10.6 (A) 07/15/2022   Vitals:   09/08/22 1351 09/08/22 1356  BP: 139/81 123/77  Pulse: 94 94  SpO2: 100%     Lipid Panel     Component Value Date/Time   CHOL 114 10/27/2021 1104   TRIG 84  10/27/2021 1104   HDL 53 10/27/2021 1104   CHOLHDL 2.2 10/27/2021 1104   CHOLHDL 1.6 06/09/2016 1012   VLDL 15 06/09/2016 1012   LDLCALC 44 10/27/2021 1104   LDLDIRECT 83 04/06/2020 1447   LDLDIRECT 62 05/16/2007 2138    Clinical Atherosclerotic Cardiovascular Disease (ASCVD): No    A/P: Diabetes longstanding, currently above goal based on A1c. Patient is able to verbalize appropriate hypoglycemia management plan. Patient did not start Ozempic (semaglutide) following last visit due to concern of side effects. Control is suboptimal due to elevated post prandial glucoses. After re-discussing the risk vs. benefit of Ozempic, patient is agreeable to start.  -Started GLP-1 Ozempic (generic semaglutide) 0.25 mg weekly. Patient agreeable to start taking. PA approved.  -Continued SGLT2-I Jardiance (empagliflozin) 25 mg daily.  -Continued metformin 1000 mg BID.  -Patient educated on purpose, proper use, and potential adverse effects of Ozempic.  -Extensively discussed pathophysiology of diabetes, recommended lifestyle interventions, dietary effects on blood sugar control.  -Counseled on s/sx of and management of hypoglycemia.  -Next A1c anticipated December 2023.   Hypertension longstanding currently controlled based repeat on office BP. Blood pressure goal of <130/80 mmHg. Medication adherence appropriate. Patient has tolerated the switch to valsartan well.   -Continue valsartan 160 mg daily  Written patient instructions provided. Patient verbalized understanding of treatment plan.  Total time in face to face counseling 25 minutes.    Follow-up:  Pharmacist 10/13/2022. Patient seen with Ilene Qua, PharmD Candidate, [place] [place] PharmD  Candidate.and Joseph Art, PharmD, PGY2 Pharmacy Resident.

## 2022-09-08 NOTE — Patient Instructions (Addendum)
It was great to see you today.  START Ozempic (semaglutide) 0.25 mg injections once weekly.  We will follow-up on 11/30 to see how you are doing on the Ozempic. Please call us if you have any concerns before then.

## 2022-09-09 NOTE — Assessment & Plan Note (Signed)
Diabetes longstanding, currently above goal based on A1c. Patient is able to verbalize appropriate hypoglycemia management plan. Patient did not start Ozempic (semaglutide) following last visit due to concern of side effects. Control is suboptimal due to elevated post prandial glucoses. After re-discussing the risk vs. benefit of Ozempic, patient is agreeable to start.  -Started GLP-1 Ozempic (generic semaglutide) 0.25 mg weekly. Patient agreeable to start taking. PA approved.  -Continued SGLT2-I Jardiance (empagliflozin) 25 mg daily.  -Continued metformin 1000 mg BID.  -Patient educated on purpose, proper use, and potential adverse effects of Ozempic.  -Extensively discussed pathophysiology of diabetes, recommended lifestyle interventions, dietary effects on blood sugar control.  -Counseled on s/sx of and management of hypoglycemia.  -Next A1c anticipated December 2023.

## 2022-09-09 NOTE — Assessment & Plan Note (Signed)
Hyertension longstanding currently controlled based repeat on office BP. Blood pressure goal of <130/80 mmHg. Medication adherence appropriate. Patient has tolerated the switch to valsartan well.   -Continue valsartan 160 mg daily

## 2022-09-12 NOTE — Progress Notes (Signed)
Reviewed: I agree with Dr. Koval's documentation and management. 

## 2022-09-15 ENCOUNTER — Ambulatory Visit: Payer: Medicaid Other | Admitting: Pharmacist

## 2022-09-16 ENCOUNTER — Other Ambulatory Visit: Payer: Self-pay | Admitting: Family Medicine

## 2022-09-19 ENCOUNTER — Other Ambulatory Visit: Payer: Self-pay | Admitting: Family Medicine

## 2022-09-22 DIAGNOSIS — E119 Type 2 diabetes mellitus without complications: Secondary | ICD-10-CM | POA: Diagnosis not present

## 2022-10-06 ENCOUNTER — Other Ambulatory Visit: Payer: Self-pay | Admitting: Family Medicine

## 2022-10-13 ENCOUNTER — Encounter: Payer: Self-pay | Admitting: Pharmacist

## 2022-10-13 ENCOUNTER — Ambulatory Visit (INDEPENDENT_AMBULATORY_CARE_PROVIDER_SITE_OTHER): Payer: Medicare HMO | Admitting: Pharmacist

## 2022-10-13 VITALS — BP 129/76 | HR 99 | Wt 142.2 lb

## 2022-10-13 DIAGNOSIS — I152 Hypertension secondary to endocrine disorders: Secondary | ICD-10-CM | POA: Diagnosis not present

## 2022-10-13 DIAGNOSIS — E119 Type 2 diabetes mellitus without complications: Secondary | ICD-10-CM | POA: Diagnosis not present

## 2022-10-13 DIAGNOSIS — E1159 Type 2 diabetes mellitus with other circulatory complications: Secondary | ICD-10-CM | POA: Diagnosis not present

## 2022-10-13 MED ORDER — INSULIN GLARGINE 100 UNIT/ML SOLOSTAR PEN: 6.0000 [IU] | PEN_INJECTOR | Freq: Every day | SUBCUTANEOUS | 11 refills | Status: DC

## 2022-10-13 MED ORDER — PEN NEEDLES 31G X 5 MM MISC: 3 refills | Status: DC

## 2022-10-13 NOTE — Progress Notes (Signed)
S:    Chief Complaint  Patient presents with   Medication Management    Diabetes follow-up   66 y.o. female who presents for diabetes evaluation, education, and management.  PMH is significant for T2DM, HTN, and history of TIA.  Patient was last seen in Rx clinic on 08/18/22. Patient was last seen by Primary Care Provider, Dr. Wynelle Link, on 07/15/22.   At last visit, patient had not yet started Ozempic (semaglutide) due to concerns regarding side effects.   Today, patient arrives in good spirits and presents without any assistance. States she started taking Ozempic (semaglutide) 0.25 mg every other week and she did not increase to 0.5 mg due to recurrent nausea (vomited 1x). Nausea did not improve with repeat doses. She is unsure how many doses of Ozempic she has taken. She endorses concerns regarding her weight loss, states she would ideally like to be ~160 lbs, weight down to 142 lbs today. She expresses reluctance to continue Ozempic.   Current diabetes medications include: Jardiance (empagliflozin) 25 mg daily, metformin 1000 mg BID, Ozempic (semaglutide) 0.25 mg every other week Current hypertension medications include: Valsartan 160 mg daily, carvedilol 25 mg BID Current hyperlipidemia medications include: atorvastatin 40 mg daily  Patient reports adherence to taking all medications as prescribed. However, has been taking Ozempic every other week due to nausea.   Insurance coverage: [employer] Medicare, Medicaid  Patient denies hypoglycemic events.  Reported home fasting blood sugars: this morning 133, mostly 170s- low 200s Home BP readings: 119/66, 108/64, 121/77, 129/70, 135/64   Pt reports nocturia 2x/night Pt denies neuropathy Pt denies visual changes Pt endorses daily foot exams  Diet: Eating less since starting Ozempic; had less for Thanksgiving than she typically would. Did not eat sweets at Thanksgiving.   O:  Review of Systems  Constitutional:  Positive for weight loss  (baseline weight ~160 lbs, down to 142 lbs since starting Ozempic).  Gastrointestinal:  Positive for nausea (since starting Ozempic).  All other systems reviewed and are negative.   Physical Exam Constitutional:      Appearance: Normal appearance.  Pulmonary:     Effort: Pulmonary effort is normal.  Neurological:     Mental Status: She is alert.  Psychiatric:        Mood and Affect: Mood normal.        Behavior: Behavior normal.     Lab Results  Component Value Date   HGBA1C 10.6 (A) 07/15/2022   Vitals:   10/13/22 1127  BP: 129/76  Pulse: 99  SpO2: 100%    Lipid Panel     Component Value Date/Time   CHOL 114 10/27/2021 1104   TRIG 84 10/27/2021 1104   HDL 53 10/27/2021 1104   CHOLHDL 2.2 10/27/2021 1104   CHOLHDL 1.6 06/09/2016 1012   VLDL 15 06/09/2016 1012   LDLCALC 44 10/27/2021 1104   LDLDIRECT 83 04/06/2020 1447   LDLDIRECT 62 05/16/2007 2138    Clinical Atherosclerotic Cardiovascular Disease (ASCVD): No   A/P: Diabetes longstanding, currently above goal based on A1c however improved control based on recent home blood glucose monitoring. Patient is able to verbalize appropriate hypoglycemia management plan. Patient did not tolerate Ozempic (semaglutide) due to GI intolerance. Control is suboptimal due medication intolerance. Discussed alternative regimens, such as reducing Ozempic (semaglutide) dose vs. starting Victoza (liraglutide) vs. starting insulin. Patient elected to restart insulin.  -Restarted basal insulin Lantus (insulin glargine) 6 units once daily in the AM. Patient previously on insulin and is  aware of how to administer.  -Discontinue GLP-1 Ozempic (semaglutide) due to GI intolerance.  -Continued SGLT2-I Jardiance (empagliflozin) 25 mg daily.  -Continued metformin 1000 mg BID.  -Patient educated on purpose, proper use, and potential adverse effects of insulin.  -Extensively discussed pathophysiology of diabetes, recommended lifestyle  interventions, dietary effects on blood sugar control.  -Counseled on s/sx of and management of hypoglycemia.  -Next A1c anticipated December 2023.   Hypertension longstanding currently controlled based repeat on office BP. Blood pressure goal of <130/80 mmHg. Medication adherence appropriate.  -Continue valsartan 160 mg daily  Written patient instructions provided. Patient verbalized understanding of treatment plan.  Total time in face to face counseling 25 minutes.    Follow-up:  PCP 11/10/2022 Pharmacist 12/01/2022 Patient seen with Nils Pyle,  PharmD Candidate and Valeda Malm, PharmD, PGY2 Pharmacy Resident.

## 2022-10-13 NOTE — Assessment & Plan Note (Signed)
Diabetes longstanding, currently above goal based on A1c however improved control based on recent home blood glucose monitoring. Patient is able to verbalize appropriate hypoglycemia management plan. Patient did not tolerate Ozempic (semaglutide) due to GI intolerance. Control is suboptimal due medication intolerance. Discussed alternative regimens, such as reducing Ozempic (semaglutide) dose vs. starting Victoza (liraglutide) vs. starting insulin. Patient elected to restart insulin.  -Restarted basal insulin Lantus (insulin glargine) 6 units once daily in the AM. Patient previously on insulin and is aware of how to administer.  -Discontinue GLP-1 Ozempic (semaglutide) due to GI intolerance.  -Continued SGLT2-I Jardiance (empagliflozin) 25 mg daily.  -Continued metformin 1000 mg BID.  -Patient educated on purpose, proper use, and potential adverse effects of insulin.  -Extensively discussed pathophysiology of diabetes, recommended lifestyle interventions, dietary effects on blood sugar control.  -Counseled on s/sx of and management of hypoglycemia.  -Next A1c anticipated December 2023.

## 2022-10-13 NOTE — Progress Notes (Signed)
Reviewed: I agree with Dr. Koval's documentation and management. 

## 2022-10-13 NOTE — Patient Instructions (Signed)
It was nice to see you today!  Your goal blood sugar is 80-130 before eating and less than 180 after eating.  Medication Changes: Begin Lantus (insulin glargine) 6 units every morning Stop Ozempic.   Monitor blood sugars at home and keep a log (glucometer or piece of paper) to bring with you to your next visit.  Keep up the good work with diet and exercise. Aim for a diet full of vegetables, fruit and lean meats (chicken, [place], fish). Try to limit salt intake by eating fresh or frozen vegetables (instead of canned), rinse canned vegetables prior to cooking and do not add any additional salt to meals.

## 2022-10-13 NOTE — Assessment & Plan Note (Signed)
Hypertension longstanding currently controlled based repeat on office BP. Blood pressure goal of <130/80 mmHg. Medication adherence appropriate.  -Continue valsartan 160 mg daily

## 2022-10-25 ENCOUNTER — Other Ambulatory Visit: Payer: Self-pay

## 2022-10-25 DIAGNOSIS — S62645A Nondisplaced fracture of proximal phalanx of left ring finger, initial encounter for closed fracture: Secondary | ICD-10-CM

## 2022-10-31 ENCOUNTER — Other Ambulatory Visit: Payer: Self-pay

## 2022-10-31 ENCOUNTER — Other Ambulatory Visit: Payer: Self-pay | Admitting: Family Medicine

## 2022-10-31 DIAGNOSIS — S62645A Nondisplaced fracture of proximal phalanx of left ring finger, initial encounter for closed fracture: Secondary | ICD-10-CM

## 2022-11-01 MED ORDER — ACCU-CHEK GUIDE W/DEVICE KIT: PACK | 3 refills | Status: AC

## 2022-11-06 ENCOUNTER — Other Ambulatory Visit: Payer: Self-pay | Admitting: Family Medicine

## 2022-11-06 DIAGNOSIS — E1169 Type 2 diabetes mellitus with other specified complication: Secondary | ICD-10-CM

## 2022-11-09 ENCOUNTER — Other Ambulatory Visit: Payer: Self-pay

## 2022-11-10 ENCOUNTER — Ambulatory Visit (INDEPENDENT_AMBULATORY_CARE_PROVIDER_SITE_OTHER): Payer: Medicare HMO | Admitting: Family Medicine

## 2022-11-10 ENCOUNTER — Encounter: Payer: Self-pay | Admitting: Family Medicine

## 2022-11-10 VITALS — BP 134/70 | HR 95 | Ht 65.0 in | Wt 143.2 lb

## 2022-11-10 DIAGNOSIS — E119 Type 2 diabetes mellitus without complications: Secondary | ICD-10-CM

## 2022-11-10 LAB — POCT GLYCOSYLATED HEMOGLOBIN (HGB A1C): HbA1c, POC (controlled diabetic range): 8.9 % — AB (ref 0.0–7.0)

## 2022-11-10 NOTE — Patient Instructions (Addendum)
It was nice seeing you today!  [name] is better - 8.9. Great job! No change to your medications today.  Come back and see me in 3 months or sooner if needed.  You are due for a Medicare Annual Wellness exam. Schedule this at the front desk. This is usually a phone call with one of our nurses.  Stay well, Monica Deeds, MD [facility] [phone]  --  Make sure to check out at the front desk before you leave today.  Please arrive at least 15 minutes prior to your scheduled appointments.  If you had blood work today, I will send you a MyChart message or a letter if results are normal. Otherwise, I will give you a call.  If you had a referral placed, they will call you to set up an appointment. Please give [place] a call if you don't hear back in the next 2 weeks.  If you need additional refills before your next appointment, please call your pharmacy first.

## 2022-11-10 NOTE — Assessment & Plan Note (Signed)
Improving with resumption of basal insulin, not quite at goal.  Seems to be adequately controlled based on home fasting CBG readings.  No change to current medication regimen.  She has planned follow-up with our clinical pharmacist and I will plan to see her in 3 months for next A1c.  Will plan to get urine microalbumin at next visit.

## 2022-11-10 NOTE — Progress Notes (Signed)
    SUBJECTIVE:   CHIEF COMPLAINT / HPI:  Chief Complaint  Patient presents with   Follow-up    A1C    Patient was recently seen in pharmacy clinic for diabetes management 1 month ago.  She was restarted on Lantus 6 units daily.  Semaglutide was discontinued.  She was continued on empagliflozin 25 mg and metformin 1000 mg twice daily. Reports good adherence to her medications. Fasting CBGs: 134 this morning, typically 130s Denies symptomatic hypoglycemia. Had one CBG reading 68 but was asymptomatic.  Home BP readings reviewed, mostly 120s-130s/80s.  PERTINENT  PMH / PSH: T2DM, HTN, TIA  Patient Care Team: Littie Deeds, MD as PCP - General (Family Medicine) Rollene Rotunda, MD as PCP - Cardiology (Cardiology)   OBJECTIVE:   BP 134/70   Pulse 95   Ht 5\' 5"  (1.651 m)   Wt 143 lb 4 oz (65 kg)   SpO2 99%   BMI 23.84 kg/m   Physical Exam Constitutional:      General: She is not in acute distress. Cardiovascular:     Rate and Rhythm: Normal rate and regular rhythm.  Pulmonary:     Effort: Pulmonary effort is normal. No respiratory distress.     Breath sounds: Normal breath sounds.  Musculoskeletal:     Cervical back: Neck supple.  Neurological:     Mental Status: She is alert.         11/10/2022   11:26 AM  Depression screen PHQ 2/9  Decreased Interest 2  Down, Depressed, Hopeless 0  PHQ - 2 Score 2  Altered sleeping 1  Tired, decreased energy 0  Change in appetite 0  Feeling bad or failure about yourself  0  Trouble concentrating 0  Moving slowly or fidgety/restless 0  Suicidal thoughts 0  PHQ-9 Score 3     {Show previous vital signs (optional):23777}  Last hemoglobin A1c Lab Results  Component Value Date   HGBA1C 8.9 (A) 11/10/2022      ASSESSMENT/PLAN:   Type 2 diabetes mellitus without complications (HCC) Improving with resumption of basal insulin, not quite at goal.  Seems to be adequately controlled based on home fasting CBG readings.  No  change to current medication regimen.  She has planned follow-up with our clinical pharmacist and I will plan to see her in 3 months for next A1c.  Will plan to get urine microalbumin at next visit.   HCM - reports eye exam in the past year, next due in February. Still have not received records. Reports last exam was normal.  Return in about 3 months (around 02/09/2023) for f/u diabetes.   02/11/2023, MD [facility]

## 2022-11-11 ENCOUNTER — Other Ambulatory Visit: Payer: Self-pay | Admitting: Family Medicine

## 2022-12-01 ENCOUNTER — Other Ambulatory Visit: Payer: Self-pay | Admitting: Family Medicine

## 2022-12-01 ENCOUNTER — Ambulatory Visit: Payer: Medicare PPO | Admitting: Pharmacist

## 2022-12-02 ENCOUNTER — Other Ambulatory Visit: Payer: Self-pay

## 2022-12-02 MED ORDER — MIRTAZAPINE 7.5 MG PO TABS: 7.5000 mg | ORAL_TABLET | Freq: Every day | ORAL | 1 refills | Status: DC

## 2022-12-07 ENCOUNTER — Ambulatory Visit (INDEPENDENT_AMBULATORY_CARE_PROVIDER_SITE_OTHER): Payer: Medicare PPO | Admitting: Pharmacist

## 2022-12-07 ENCOUNTER — Encounter: Payer: Self-pay | Admitting: Pharmacist

## 2022-12-07 VITALS — BP 127/68 | HR 85 | Wt 148.0 lb

## 2022-12-07 DIAGNOSIS — E785 Hyperlipidemia, unspecified: Secondary | ICD-10-CM | POA: Diagnosis not present

## 2022-12-07 DIAGNOSIS — E1169 Type 2 diabetes mellitus with other specified complication: Secondary | ICD-10-CM | POA: Diagnosis not present

## 2022-12-07 DIAGNOSIS — I152 Hypertension secondary to endocrine disorders: Secondary | ICD-10-CM

## 2022-12-07 DIAGNOSIS — E1159 Type 2 diabetes mellitus with other circulatory complications: Secondary | ICD-10-CM | POA: Diagnosis not present

## 2022-12-07 DIAGNOSIS — E119 Type 2 diabetes mellitus without complications: Secondary | ICD-10-CM

## 2022-12-07 MED ORDER — MIRTAZAPINE 7.5 MG PO TABS: 7.5000 mg | ORAL_TABLET | Freq: Every day | ORAL | 1 refills | Status: DC

## 2022-12-07 NOTE — Progress Notes (Unsigned)
S:     Chief Complaint  Patient presents with   Medication Management    Diabetes   67 y.o. female who presents for diabetes evaluation, education, and management.  PMH is significant for type 2 diabetes, hyperlipidemia, hypertension and insomnia.  Patient was referred and last seen by Primary Care Provider, Dr. Nancy Fetter, on 11/10/2022.  At last visit, her blood sugars had improved and her regimen was continued (no medication change).   Today, patient arrives in good spirits and presents without any assistance.   Patient reports Diabetes was diagnosed in 2015.   Current diabetes medications include: Jardiance (empagliflozin) 25 mg daily, metformin 1000 mg twice daily, Lantus (insulin glargine) 6 units daily. Current hypertension medications include: valsartan 160 mg once daily Current hyperlipidemia medications include: atorvastatin 40 mg daily.   Patient reports adherence to taking all medications as prescribed.   Do you feel that your medications are working for you? yes Have you been experiencing any side effects to the medications prescribed? no Insurance coverage: [facility] Medicare  Patient denies symptomatic hypoglycemia but does report one reading below 70 mg/dL.  Reported home fasting blood sugars: ~120 mg/dL   Patient reports nocturia (nighttime urination). 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   Physical Exam Constitutional:      Appearance: Normal appearance. She is normal weight.  Pulmonary:     Effort: Pulmonary effort is normal.     Breath sounds: Normal breath sounds.  Neurological:     Mental Status: She is alert.  Psychiatric:        Mood and Affect:  Mood normal.        Behavior: Behavior normal.        Thought Content: Thought content normal.        Judgment: Judgment normal.      Lab Results  Component Value Date   HGBA1C 8.9 (A) 11/10/2022   There were no vitals filed for this visit.  Lipid Panel     Component Value Date/Time   CHOL 114 10/27/2021 1104   TRIG 84 10/27/2021 1104   HDL 53 10/27/2021 1104   CHOLHDL 2.2 10/27/2021 1104   CHOLHDL 1.6 06/09/2016 1012   VLDL 15 06/09/2016 1012   LDLCALC 44 10/27/2021 1104   LDLDIRECT 83 04/06/2020 1447   LDLDIRECT 62 05/16/2007 2138    Clinical Atherosclerotic Cardiovascular Disease (ASCVD): Yes  The ASCVD Risk score (Arnett DK, et al., 2019) failed to calculate for the following reasons:   The valid total cholesterol range is 130 to 320 mg/dL    A/P: Diabetes longstanding significantly improved since last evaluation based on at-home readings and logs. Patient is able to verbalize appropriate hypoglycemia management plan. Medication adherence appears good. Patient's at-home readings were within goal range. Patient was instructed to take 1-2 postprandial blood sugars per week to assess pancreas function.  -Continued basal insulin Lantus (insulin glargine) at 6 units per day. Patient was incstructed to drop to 5 units if she sees readings below 80 mg/dL.  -Continued SGLT2-I Jardiance (empagliflozin) 25 mg. Counseled on sick day rules. -Continued metformin 1000 mg twice daily.  -Patient educated on purpose, proper use, and potential adverse effects. -Extensively discussed pathophysiology  of diabetes, recommended lifestyle interventions, dietary effects on blood sugar control.  -Counseled on s/sx of and management of hypoglycemia.  -Next A1c anticipated 02/09/2023.   ASCVD risk - secondary prevention in patient with diabetes. Last LDL is at goal of <70 mg/dL (44 mg/dL on 10/27/2021). ASCVD risk factors include H/O TIA, type 2 diabetes, hyperlipidemia, and hypertension.  High  intensity statin indicated.  -Continued atorvastatin 40 mg.  -No side effects reported with current regimen -Last lipid panel was in 2022. Get another lipid panel at next PCP visit.   Hypertension longstanding since 2008, currently controlled. Blood pressure goal of <130/80 mmHg. Medication adherence appears good.  -Continue valsartan 160 mg daily  Written patient instructions provided. Patient verbalized understanding of treatment plan.  Total time in face to face counseling 25 minutes.    Follow-up:  PCP clinic visit on 3/38/2024.  Patient seen with Dixon Boos, PharmD Candidate.

## 2022-12-07 NOTE — Patient Instructions (Addendum)
It was nice to see you today.   Continue your medications as you have been taking them. If you see any blood sugar readings below 80 mg/dL, drop your daily insulin to 5 units.   Make sure to check your blood sugar 2 hours after meals once or twice a week. Log these numbers and bring them to your next appointment.   Next appointment, also bring any old medications that you will not use. We can dispose of those for you.

## 2022-12-08 NOTE — Progress Notes (Signed)
Reviewed: I agree with Dr. Graylin Shiver documentation and management.

## 2022-12-08 NOTE — Assessment & Plan Note (Signed)
ASCVD risk - secondary prevention in patient with diabetes. Last LDL is at goal of <70 mg/dL (44 mg/dL on 10/27/2021). ASCVD risk factors include H/O TIA, type 2 diabetes, hyperlipidemia, and hypertension.  High intensity statin indicated.  -Continued atorvastatin 40 mg.  -No side effects reported with current regimen -Last lipid panel was in 2022.  -Discussed plan with patient to obtain lipid panel at next lab draw/PCP visit.

## 2022-12-08 NOTE — Assessment & Plan Note (Signed)
Hypertension longstanding since 2008, currently controlled. Blood pressure goal of <130/80 mmHg. Medication adherence appears good.  -Continue valsartan 160 mg daily

## 2022-12-08 NOTE — Assessment & Plan Note (Signed)
Diabetes longstanding significantly improved since last evaluation based on at-home readings and logs. Patient is able to verbalize appropriate hypoglycemia management plan. Medication adherence appears good. Patient's at-home fasting readings were within goal range. Patient was instructed to take 1-2 postprandial blood sugars per week to assess pancreas function.  -Continued basal insulin Lantus (insulin glargine) at 6 units per day. Patient was incstructed to drop to 5 units if she sees readings below 80 mg/dL.  -Continued SGLT2-I Jardiance (empagliflozin) 25 mg. Counseled on sick day rules. -Continued metformin 1000 mg twice daily.  -Patient educated on purpose, proper use, and potential adverse effects. -Extensively discussed pathophysiology of diabetes, recommended lifestyle interventions, dietary effects on blood sugar control.  -Counseled on s/sx of and management of hypoglycemia.  -Next A1c anticipated 02/09/2023.

## 2022-12-14 ENCOUNTER — Other Ambulatory Visit: Payer: Self-pay | Admitting: Family Medicine

## 2022-12-15 ENCOUNTER — Other Ambulatory Visit: Payer: Self-pay | Admitting: Family Medicine

## 2022-12-21 ENCOUNTER — Other Ambulatory Visit: Payer: Self-pay | Admitting: Family Medicine

## 2023-01-21 ENCOUNTER — Other Ambulatory Visit: Payer: Self-pay | Admitting: Family Medicine

## 2023-01-21 DIAGNOSIS — E119 Type 2 diabetes mellitus without complications: Secondary | ICD-10-CM

## 2023-02-03 ENCOUNTER — Other Ambulatory Visit: Payer: Self-pay

## 2023-02-03 ENCOUNTER — Other Ambulatory Visit: Payer: Self-pay | Admitting: Family Medicine

## 2023-02-03 DIAGNOSIS — E119 Type 2 diabetes mellitus without complications: Secondary | ICD-10-CM

## 2023-02-03 NOTE — Telephone Encounter (Signed)
Patient calls nurse line requesting refills on pen needles. Per chart review, 90 day supply with three refills was sent to pharmacy on 10/13/2022.  Advised that patient should contact pharmacy to refill pen needles.   Patient will call back if there are any issues.   Talbot Grumbling, RN

## 2023-02-09 ENCOUNTER — Ambulatory Visit (INDEPENDENT_AMBULATORY_CARE_PROVIDER_SITE_OTHER): Payer: Medicare PPO | Admitting: Family Medicine

## 2023-02-09 ENCOUNTER — Encounter: Payer: Self-pay | Admitting: Family Medicine

## 2023-02-09 VITALS — BP 140/83 | HR 83 | Ht 65.0 in | Wt 152.0 lb

## 2023-02-09 DIAGNOSIS — E1159 Type 2 diabetes mellitus with other circulatory complications: Secondary | ICD-10-CM

## 2023-02-09 DIAGNOSIS — I152 Hypertension secondary to endocrine disorders: Secondary | ICD-10-CM

## 2023-02-09 DIAGNOSIS — E119 Type 2 diabetes mellitus without complications: Secondary | ICD-10-CM | POA: Diagnosis not present

## 2023-02-09 DIAGNOSIS — Z13820 Encounter for screening for osteoporosis: Secondary | ICD-10-CM | POA: Diagnosis not present

## 2023-02-09 DIAGNOSIS — R634 Abnormal weight loss: Secondary | ICD-10-CM

## 2023-02-09 LAB — POCT GLYCOSYLATED HEMOGLOBIN (HGB A1C): HbA1c, POC (controlled diabetic range): 9.2 % — AB (ref 0.0–7.0)

## 2023-02-09 NOTE — Progress Notes (Signed)
SUBJECTIVE:   CHIEF COMPLAINT / HPI:  Chief Complaint  Patient presents with   Diabetes f/u    Diabetes medications include: Lantus 6 units daily, empagliflozin 25 mg, metformin 1000 mg twice daily. Reports good adherence to medications. Checking CBG twice daily, fasting and 2 hours after dinner Has CBG log with her, last 1 month reviewed Fasting CBG range 72-143 Post-prandial range 85-281 Denies any symptomatic hypoglycemia Avoiding soda but does drink some juice  Home BP readings 110s-130s mostly, highest 141/70   Sister recently was diagnosed with ovarian cancer. No longer working at [employer].  PERTINENT  PMH / PSH: T2DM, HTN, TIA  Patient Care Team: Zola Button, MD as PCP - General (Family Medicine) Minus Breeding, MD as PCP - Cardiology (Cardiology)   OBJECTIVE:   BP (!) 140/83   Pulse 83   Ht 5\' 5"  (1.651 m)   Wt 152 lb (68.9 kg)   LMP  (LMP Unknown)   SpO2 97%   Breastfeeding Unknown   BMI 25.29 kg/m   Physical Exam Constitutional:      General: She is not in acute distress. Cardiovascular:     Rate and Rhythm: Normal rate and regular rhythm.  Pulmonary:     Effort: Pulmonary effort is normal. No respiratory distress.     Breath sounds: Normal breath sounds.  Musculoskeletal:     Cervical back: Neck supple.  Neurological:     Mental Status: She is alert.         02/09/2023    2:21 PM  Depression screen PHQ 2/9  Decreased Interest 3  Down, Depressed, Hopeless 0  PHQ - 2 Score 3  Altered sleeping 2  Tired, decreased energy 2  Change in appetite 2  Feeling bad or failure about yourself  0  Trouble concentrating 0  Moving slowly or fidgety/restless 0  Suicidal thoughts 0  PHQ-9 Score 9     Wt Readings from Last 3 Encounters:  02/09/23 152 lb (68.9 kg)  12/07/22 148 lb (67.1 kg)  11/10/22 143 lb 4 oz (65 kg)      Last hemoglobin A1c Lab Results  Component Value Date   HGBA1C 9.2 (A) 02/09/2023      ASSESSMENT/PLAN:    Problem List Items Addressed This Visit       Cardiovascular and Mediastinum   Hypertension associated with diabetes ([place])    Mildly elevated in office but ambulatory BP readings are well-controlled.  No changes to current regimen, will continue to monitor.        Endocrine   Type 2 diabetes mellitus without complications (HCC) - Primary    Chronic, uncontrolled, A1c is slightly worse compared to last visit.  Likely secondary to weight gain.  Fasting sugars are well-controlled.  Could consider adding small amount of short acting insulin, but we opted to try to optimize diet. - continue Lantus 6 units daily - continue empagliflozin 25 mg - continue metformin 1000 mg BID - dietary counseling provided, advised to reduce juice and applesauce intake      Relevant Orders   HgB A1c (Completed)   Microalbumin/Creatinine Ratio, Urine     Other   Loss of weight    Now resolved, she is actually gaining weight.  Will discontinue mirtazapine.      Other Visit Diagnoses     Screening for osteoporosis       Relevant Orders   DG BONE DENSITY (DXA)        HCM - last  DM eye exam within the year at [facility], still have not been able to receive records  Return in about 3 months (around 05/12/2023) for f/u diabetes.   Zola Button, MD [place]

## 2023-02-09 NOTE — Assessment & Plan Note (Signed)
Mildly elevated in office but ambulatory BP readings are well-controlled.  No changes to current regimen, will continue to monitor.

## 2023-02-09 NOTE — Patient Instructions (Addendum)
It was nice seeing you today!  Try drinking sparkling water. Try to cut back on juice and applesauce.  Stop the mirtazapine (Remeron) for now.  Make sure to schedule your bone scan.  Stay well, Zola Button, MD [place] [phone]  --  Make sure to check out at the front desk before you leave today.  Please arrive at least 15 minutes prior to your scheduled appointments.  If you had blood work today, I will send you a MyChart message or a letter if results are normal. Otherwise, I will give you a call.  If you had a referral placed, they will call you to set up an appointment. Please give [place] a call if you don't hear back in the next 2 weeks.  If you need additional refills before your next appointment, please call your pharmacy first.

## 2023-02-09 NOTE — Assessment & Plan Note (Signed)
Now resolved, she is actually gaining weight.  Will discontinue mirtazapine.

## 2023-02-09 NOTE — Assessment & Plan Note (Signed)
Chronic, uncontrolled, A1c is slightly worse compared to last visit.  Likely secondary to weight gain.  Fasting sugars are well-controlled.  Could consider adding small amount of short acting insulin, but we opted to try to optimize diet. - continue Lantus 6 units daily - continue empagliflozin 25 mg - continue metformin 1000 mg BID - dietary counseling provided, advised to reduce juice and applesauce intake

## 2023-02-12 ENCOUNTER — Encounter: Payer: Self-pay | Admitting: Family Medicine

## 2023-02-12 LAB — MICROALBUMIN / CREATININE URINE RATIO
Creatinine, Urine: 45.1 mg/dL
Microalb/Creat Ratio: <7 mg/g creat (ref 0–29)
Microalbumin, Urine: <3 ug/mL

## 2023-02-17 ENCOUNTER — Other Ambulatory Visit: Payer: Self-pay | Admitting: Family Medicine

## 2023-04-14 ENCOUNTER — Other Ambulatory Visit: Payer: Self-pay | Admitting: Family Medicine

## 2023-04-25 ENCOUNTER — Other Ambulatory Visit: Payer: Self-pay

## 2023-04-25 NOTE — Progress Notes (Signed)
   Yexalen Deike 12-14-1955 [id number]  Patient outreached by telephone , PharmD Candidate on 04/25/23.  Blood Pressure Readings: Does the patient have a validated home blood pressure machine?: Yes  Medication review was performed. Is the patient taking their medications as prescribed?: Yes  The following barriers to adherence were noted: Does the patient have cost concerns?: No Does the patient have transportation concerns?: No Does the patient need assistance obtaining refills?: No Does the patient occassionally forget to take some of their prescribed medications?: No Does the patient feel like one/some of their medications make them feel poorly?: No Does the patient have questions or concerns about their medications?: No Does the patient have a follow up scheduled with their primary care provider/cardiologist?: Yes   Interventions: Interventions Completed: Medications were reviewed, Patient was educated on goal blood pressures and long term health implications of elevated blood pressure  The patient has follow up scheduled:  PCP: Littie Deeds, MD   Christella Noa, Student-PharmD

## 2023-04-26 ENCOUNTER — Ambulatory Visit (INDEPENDENT_AMBULATORY_CARE_PROVIDER_SITE_OTHER): Payer: Medicare PPO | Admitting: Family Medicine

## 2023-04-26 ENCOUNTER — Encounter: Payer: Self-pay | Admitting: Family Medicine

## 2023-04-26 VITALS — BP 137/64 | HR 87 | Ht 65.0 in | Wt 158.6 lb

## 2023-04-26 DIAGNOSIS — E1159 Type 2 diabetes mellitus with other circulatory complications: Secondary | ICD-10-CM

## 2023-04-26 DIAGNOSIS — I152 Hypertension secondary to endocrine disorders: Secondary | ICD-10-CM

## 2023-04-26 DIAGNOSIS — E119 Type 2 diabetes mellitus without complications: Secondary | ICD-10-CM | POA: Diagnosis not present

## 2023-04-26 DIAGNOSIS — E1169 Type 2 diabetes mellitus with other specified complication: Secondary | ICD-10-CM | POA: Diagnosis not present

## 2023-04-26 DIAGNOSIS — E785 Hyperlipidemia, unspecified: Secondary | ICD-10-CM

## 2023-04-26 LAB — POCT GLYCOSYLATED HEMOGLOBIN (HGB A1C): HbA1c, POC (controlled diabetic range): 9.2 % — AB (ref 0.0–7.0)

## 2023-04-26 MED ORDER — NOVOLOG FLEXPEN 100 UNIT/ML ~~LOC~~ SOPN: 2.0000 [IU] | PEN_INJECTOR | Freq: Every day | SUBCUTANEOUS | 2 refills | Status: DC

## 2023-04-26 NOTE — Assessment & Plan Note (Signed)
On statin, due for repeat lipid panel today

## 2023-04-26 NOTE — Assessment & Plan Note (Signed)
Well-controlled on current regimen, will repeat metabolic panel today

## 2023-04-26 NOTE — Patient Instructions (Addendum)
It was nice seeing you today!  Start the Novolog 2 units at dinnertime.  See Dr. Raymondo Band in about a month or so.  Stay well, Monica Deeds, MD [facility] [phone]  --  Make sure to check out at the front desk before you leave today.  Please arrive at least 15 minutes prior to your scheduled appointments.  If you had blood work today, I will send you a MyChart message or a letter if results are normal. Otherwise, I will give you a call.  If you had a referral placed, they will call you to set up an appointment. Please give [place] a call if you don't hear back in the next 2 weeks.  If you need additional refills before your next appointment, please call your pharmacy first.

## 2023-04-26 NOTE — Progress Notes (Signed)
    SUBJECTIVE:   CHIEF COMPLAINT / HPI:  Chief Complaint  Patient presents with   Diabetes    Still taking Lantus 6 units daily, empagliflozin, and metformin. Reports good adherence to medications. Reports her blood sugars have been up and down. She has brought in her CBG log. Fasting sugars: mostly 90s-100s, as high as 142 Eats 2-3 meals per day, sometimes skips lunch. Drinking a lot of water, staying away from soda and juice.  Two of her sisters recently diagnosed with cancer.  PERTINENT  PMH / PSH: T2DM, HTN, TIA  Patient Care Team: Littie Deeds, MD as PCP - General (Family Medicine) Rollene Rotunda, MD as PCP - Cardiology (Cardiology)   OBJECTIVE:   BP 137/64   Pulse 87   Ht 5\' 5"  (1.651 m)   Wt 158 lb 9.6 oz (71.9 kg)   SpO2 100%   BMI 26.39 kg/m   Physical Exam Constitutional:      General: She is not in acute distress. Cardiovascular:     Rate and Rhythm: Normal rate and regular rhythm.  Pulmonary:     Effort: Pulmonary effort is normal. No respiratory distress.     Breath sounds: Normal breath sounds.  Musculoskeletal:     Cervical back: Neck supple.  Neurological:     Mental Status: She is alert.         04/26/2023    9:52 AM  Depression screen PHQ 2/9  Decreased Interest 2  Down, Depressed, Hopeless 0  PHQ - 2 Score 2  Altered sleeping 3  Tired, decreased energy 2  Change in appetite 0  Feeling bad or failure about yourself  0  Trouble concentrating 0  Moving slowly or fidgety/restless 0  Suicidal thoughts 0  PHQ-9 Score 7     {Show previous vital signs (optional):23777}  Last hemoglobin A1c Lab Results  Component Value Date   HGBA1C 9.2 (A) 04/26/2023      ASSESSMENT/PLAN:   Problem List Items Addressed This Visit       Cardiovascular and Mediastinum   Hypertension associated with diabetes (HCC)    Well-controlled on current regimen, will repeat metabolic panel today      Relevant Medications   insulin aspart (NOVOLOG  FLEXPEN) 100 UNIT/ML FlexPen   Other Relevant Orders   Basic Metabolic Panel     Endocrine   Hyperlipidemia associated with type 2 diabetes mellitus (HCC)    On statin, due for repeat lipid panel today      Relevant Medications   insulin aspart (NOVOLOG FLEXPEN) 100 UNIT/ML FlexPen   Other Relevant Orders   Lipid Panel   Type 2 diabetes mellitus without complications (HCC) - Primary    Chronic, remains uncontrolled.  On review of glucose log, fasting sugars are very well-controlled.  Discussed adding on a small monitor short acting insulin, cautious given prior issues with hypoglycemia in the past when previously on short acting insulin. - continue Lantus 6 units daily - add Novolog 2 units daily with dinner - continue empagliflozin 25 mg - continue metformin 1000 mg BID      Relevant Medications   insulin aspart (NOVOLOG FLEXPEN) 100 UNIT/ML FlexPen   Other Relevant Orders   HgB A1c (Completed)       Return in about 3 months (around 07/27/2023) for f/u diabetes.   Littie Deeds, MD [facility]

## 2023-04-26 NOTE — Assessment & Plan Note (Signed)
Chronic, remains uncontrolled.  On review of glucose log, fasting sugars are very well-controlled.  Discussed adding on a small monitor short acting insulin, cautious given prior issues with hypoglycemia in the past when previously on short acting insulin. - continue Lantus 6 units daily - add Novolog 2 units daily with dinner - continue empagliflozin 25 mg - continue metformin 1000 mg BID

## 2023-04-27 LAB — BASIC METABOLIC PANEL
BUN/Creatinine Ratio: 13 (ref 12–28)
BUN: 17 mg/dL (ref 8–27)
CO2: 22 mmol/L (ref 20–29)
Calcium: 9.5 mg/dL (ref 8.7–10.3)
Chloride: 99 mmol/L (ref 96–106)
Creatinine, Ser: 1.31 mg/dL — ABNORMAL HIGH (ref 0.57–1.00)
Glucose: 245 mg/dL — ABNORMAL HIGH (ref 70–99)
Potassium: 5.1 mmol/L (ref 3.5–5.2)
Sodium: 137 mmol/L (ref 134–144)
eGFR: 45 mL/min/{1.73_m2} — ABNORMAL LOW (ref >59)

## 2023-04-27 LAB — LIPID PANEL
Chol/HDL Ratio: 3 ratio (ref 0.0–4.4)
Cholesterol, Total: 142 mg/dL (ref 100–199)
HDL: 48 mg/dL (ref >39)
LDL Chol Calc (NIH): 78 mg/dL (ref 0–99)
Triglycerides: 82 mg/dL (ref 0–149)
VLDL Cholesterol Cal: 16 mg/dL (ref 5–40)

## 2023-04-29 ENCOUNTER — Encounter: Payer: Self-pay | Admitting: Family Medicine

## 2023-05-02 ENCOUNTER — Telehealth: Payer: Self-pay | Admitting: Family Medicine

## 2023-05-02 ENCOUNTER — Other Ambulatory Visit: Payer: Self-pay

## 2023-05-02 DIAGNOSIS — E1122 Type 2 diabetes mellitus with diabetic chronic kidney disease: Secondary | ICD-10-CM | POA: Insufficient documentation

## 2023-05-02 MED ORDER — NOVOLOG FLEXPEN 100 UNIT/ML ~~LOC~~ SOPN: 2.0000 [IU] | PEN_INJECTOR | Freq: Every day | SUBCUTANEOUS | 2 refills | Status: DC

## 2023-05-02 NOTE — Telephone Encounter (Signed)
Called patient to discuss lab results.  Cholesterol levels look great.  Serum creatinine is slightly elevated compared to prior values but consistent with CKD stage IIIa.  I told her we will need to keep an eye on her kidney function.  I will plan to repeat her labs next week, she will call to make a lab appointment.

## 2023-05-08 ENCOUNTER — Other Ambulatory Visit: Payer: Self-pay

## 2023-05-08 DIAGNOSIS — E119 Type 2 diabetes mellitus without complications: Secondary | ICD-10-CM

## 2023-05-08 MED ORDER — DROPLET PEN NEEDLES 31G X 5 MM MISC
3 refills | Status: DC
Start: 2023-05-08 — End: 2023-07-07

## 2023-05-09 ENCOUNTER — Ambulatory Visit (INDEPENDENT_AMBULATORY_CARE_PROVIDER_SITE_OTHER): Payer: Medicare PPO | Admitting: *Deleted

## 2023-05-09 DIAGNOSIS — Z0000 Encounter for general adult medical examination without abnormal findings: Secondary | ICD-10-CM | POA: Diagnosis not present

## 2023-05-09 NOTE — Progress Notes (Signed)
Subjective:   [name] is a 67 y.o. female who presents for an Initial Medicare Annual Wellness Visit.  Visit Complete: Virtual  I connected with  Analiza Cowger on 05/09/23 by a audio enabled telemedicine application and verified that I am speaking with the correct person using two identifiers.  Patient Location: Home  Provider Location: Home Office  I discussed the limitations of evaluation and management by telemedicine. The patient expressed understanding and agreed to proceed.  Review of Systems     Cardiac Risk Factors include: advanced age (>58men, >65 women);diabetes mellitus;hypertension;smoking/ tobacco exposure     Objective:    Today's Vitals   There is no height or weight on file to calculate BMI.     05/09/2023    3:31 PM 04/26/2023    9:53 AM 02/09/2023    2:21 PM 11/10/2022   11:25 AM 07/15/2022    2:24 PM 04/19/2022    3:07 PM 04/14/2022   10:30 AM  Advanced Directives  Does Patient Have a Medical Advance Directive? No No No No No  No  Would patient like information on creating a medical advance directive? No - Patient declined  No - Patient declined No - Patient declined No - Patient declined No - Patient declined No - Patient declined    Current Medications (verified) Outpatient Encounter Medications as of 05/09/2023  Medication Sig   Alcohol Swabs (DROPSAFE ALCOHOL PREP) 70 % PADS USE AS DIRECTED AS NEEDED   aspirin 81 MG tablet Take 81 mg by mouth daily.   atorvastatin (LIPITOR) 40 MG tablet TAKE 1 TABLET EVERY DAY   Blood Glucose Monitoring Suppl (ACCU-CHEK GUIDE) w/Device KIT USE AS DIRECTED   carvedilol (COREG) 25 MG tablet TAKE 1 TABLET TWICE DAILY WITH MEALS   DROPLET INSULIN SYRINGE 30G X 1/2" 0.5 ML MISC USE WITH INSULIN 4 TIMES DAILY - AFTER MEALS AND AT BEDTIME.   fluticasone (FLONASE) 50 MCG/ACT nasal spray Place 2 sprays into both nostrils daily.   glucose blood (ACCU-CHEK GUIDE) test strip USE TO CHECK BLOOD SUGAR UP TO 4 TIMES  DAILY.   insulin aspart (NOVOLOG FLEXPEN) 100 UNIT/ML FlexPen Inject 2 Units into the skin daily. Inject once a day at suppertime.   insulin glargine (LANTUS) 100 UNIT/ML Solostar Pen Inject 6 Units into the skin daily.   Insulin Pen Needle (DROPLET PEN NEEDLES) 31G X 5 MM MISC USE TO ADMINISTER INSULIN EVERY DAY   JARDIANCE 25 MG TABS tablet TAKE 1 TABLET BY MOUTH DAILY   Lancets Ultra Fine MISC Use as needed   metFORMIN (GLUCOPHAGE) 1000 MG tablet TAKE 1 TABLET TWICE DAILY WITH MEALS   TRUEplus Lancets 33G MISC USE AS NEEDED   valsartan (DIOVAN) 160 MG tablet Take 1 tablet (160 mg total) by mouth at bedtime.   No facility-administered encounter medications on file as of 05/09/2023.    Allergies (verified) Shellfish allergy, Amlodipine, and Hctz [hydrochlorothiazide]   History: Past Medical History:  Diagnosis Date   Anemia    Benign heart murmur    Closed nondisplaced fracture of proximal phalanx of left ring finger 04/08/2020   Diabetes mellitus    Gastric ulcer    Hallux rigidus of right foot 08/29/2011   History of colonoscopy    Hyperlipidemia    Hypertension    Stroke (HCC)    2008 - right arm/leg numbness.      Past Surgical History:  Procedure Laterality Date   BREAST CYST EXCISION Bilateral    BTL  1988   COLONOSCOPY  01/13/2004   Colonoscopy - normal, diverticulosis    ENDOMETRIAL BIOPSY     Hx of endometrial Bx 2004 - negative for hyperplasia or malignancy. Patient does not remember reason for testing.   ESOPHAGOGASTRODUODENOSCOPY     normal   L and R breast lumpectomies benign  1970   Family History  Problem Relation Age of Onset   Diabetes Mother    Alcohol abuse Father    Heart disease Father        No details   Diabetes Sister    Colon cancer Neg Hx    Esophageal cancer Neg Hx    Rectal cancer Neg Hx    Stomach cancer Neg Hx    Social History   Socioeconomic History   Marital status: Single    Spouse name: Not on file   Number of children: 3    Years of education: Not on file   Highest education level: 10th grade  Occupational History   Occupation: Equities trader: [employer]    Comment: at a mental health facility  Tobacco Use   Smoking status: Never   Smokeless tobacco: Former    Types: Snuff    Quit date: 01/28/2013   Tobacco comments:    Quit for 1.5 months in past. Current quit date 01/28/2013  Vaping Use   Vaping Use: Never used  Substance and Sexual Activity   Alcohol use: Not Currently    Comment: hx excessive ETOH, quit 02/2007   Drug use: No   Sexual activity: Not Currently  Other Topics Concern   Not on file  Social History Narrative   Lives alone.  Three daughters.   Seven grands.     Social Determinants of Health   Financial Resource Strain: Low Risk  (05/09/2023)   Overall Financial Resource Strain (CARDIA)    Difficulty of Paying Living Expenses: Not hard at all  Food Insecurity: No Food Insecurity (05/09/2023)   Hunger Vital Sign    Worried About Running Out of Food in the Last Year: Never true    Ran Out of Food in the Last Year: Never true  Transportation Needs: No Transportation Needs (05/09/2023)   PRAPARE - Administrator, Civil Service (Medical): No    Lack of Transportation (Non-Medical): No  Physical Activity: Inactive (05/09/2023)   Exercise Vital Sign    Days of Exercise per Week: 0 days    Minutes of Exercise per Session: 0 min  Stress: No Stress Concern Present (05/09/2023)   [employer] of Occupational Health - Occupational Stress Questionnaire    Feeling of Stress : Only a little  Social Connections: Socially Isolated (05/09/2023)   Social Connection and Isolation Panel [NHANES]    Frequency of Communication with Friends and Family: More than three times a week    Frequency of Social Gatherings with Friends and Family: Once a week    Attends Religious Services: Never    Database administrator or Organizations: No    Attends Banker  Meetings: Never    Marital Status: Never married    Tobacco Counseling Counseling given: Not Answered Tobacco comments: Quit for 1.5 months in past. Current quit date 01/28/2013   Clinical Intake:  Pre-visit preparation completed: Yes  Pain : No/denies pain     Diabetes: Yes CBG done?: No Did pt. bring in CBG monitor from home?: No  Nutrition Risk Assessment:  Has the patient had any N/V/D  within the last 2 months?  No  Does the patient have any non-healing wounds?  No  Has the patient had any unintentional weight loss or weight gain?  No   Diabetes:  Is the patient diabetic?  Yes  If diabetic, was a CBG obtained today?  No  Did the patient bring in their glucometer from home?  No  How often do you monitor your CBG's? 2x day.   Financial Strains and Diabetes Management:  Are you having any financial strains with the device, your supplies or your medication? No .  Does the patient want to be seen by Chronic Care Management for management of their diabetes?  No  Would the patient like to be referred to a Nutritionist or for Diabetic Management?  No       Interpreter Needed?: No  Information entered by :: Remi Haggard LPN   Activities of Daily Living    05/09/2023    3:32 PM  In your present state of health, do you have any difficulty performing the following activities:  Hearing? 0  Vision? 0  Difficulty concentrating or making decisions? 0  Walking or climbing stairs? 0  Dressing or bathing? 0  Doing errands, shopping? 0  Preparing Food and eating ? N  Using the Toilet? N  Managing your Medications? N  Managing your Finances? N  Housekeeping or managing your Housekeeping? N    Patient Care Team: Littie Deeds, MD as PCP - General (Family Medicine) Rollene Rotunda, MD as PCP - Cardiology (Cardiology)  Indicate any recent Medical Services you may have received from other than [facility] providers in the past year (date may be approximate).     Assessment:    This is a routine wellness examination for [name].  Hearing/Vision screen Hearing Screening - Comments:: No trouble hearing Vision Screening - Comments:: Up to date [place]  Dietary issues and exercise activities discussed:     Goals Addressed             This Visit's Progress    Patient Stated       Stay alive       Depression Screen    05/09/2023    3:36 PM 04/26/2023    9:52 AM 02/09/2023    2:21 PM 11/10/2022   11:26 AM 07/15/2022    2:23 PM 04/19/2022    3:07 PM 04/14/2022   10:30 AM  PHQ 2/9 Scores  PHQ - 2 Score 0 2 3 2 2 3 2   PHQ- 9 Score 0 7 9 3 7 5 7     Fall Risk    05/09/2023    3:30 PM 04/26/2023    9:53 AM 11/10/2022   11:26 AM 07/15/2022    2:23 PM 04/19/2022    3:07 PM  Fall Risk   Falls in the past year? 0 0 0 1 1  Number falls in past yr: 0 0 0 0 0  Injury with Fall? 0 0 0 0 0  Follow up Falls evaluation completed;Education provided;Falls prevention discussed        MEDICARE RISK AT HOME:  Medicare Risk at Home - 05/09/23 1530     Any stairs in or around the home? No    If so, are there any without handrails? No    Home free of loose throw rugs in walkways, pet beds, electrical cords, etc? Yes    Adequate lighting in your home to reduce risk of falls? Yes    Life alert? No  Use of a cane, walker or w/c? No    Grab bars in the bathroom? No    Shower chair or bench in shower? No    Elevated toilet seat or a handicapped toilet? No             TIMED UP AND GO:  Was the test performed? No    Cognitive Function:        05/09/2023    3:32 PM  6CIT Screen  What Year? 0 points  What month? 0 points  What time? 0 points  Count back from 20 0 points  Months in reverse 4 points  Repeat phrase 10 points  Total Score 14 points    Immunizations Immunization History  Administered Date(s) Administered   Fluad Quad(high Dose 65+) 08/18/2022   Influenza Split 08/29/2011, 10/15/2012   Influenza Whole 10/29/2007, 08/06/2008, 08/28/2009,  08/23/2010   Influenza,inj,Quad PF,6+ Mos 08/16/2013, 07/30/2014, 07/16/2015, 12/14/2016, 09/13/2017, 09/18/2018, 12/27/2019, 09/24/2020, 08/27/2021   PFIZER Comirnaty(Gray Top)Covid-19 Tri-Sucrose Vaccine 06/16/2021   PFIZER(Purple Top)SARS-COV-2 Vaccination 02/02/2020, 02/23/2020, 09/27/2020   PNEUMOCOCCAL CONJUGATE-20 10/27/2021   Pfizer Covid-19 Vaccine Bivalent Booster 58yrs & up 10/11/2021   Pneumococcal Polysaccharide-23 10/14/1998, 07/30/2014   Td 02/13/1999, 12/16/2009   Tdap 12/27/2019    TDAP status: Up to date  Flu Vaccine status: Up to date  Pneumococcal vaccine status: Up to date  Covid-19 vaccine status: Information provided on how to obtain vaccines.   Qualifies for Shingles Vaccine? Yes   Zostavax completed No   Shingrix Completed?: No.    Education has been provided regarding the importance of this vaccine. Patient has been advised to call insurance company to determine out of pocket expense if they have not yet received this vaccine. Advised may also receive vaccine at local pharmacy or Health Dept. Verbalized acceptance and understanding.  Screening Tests Health Maintenance  Topic Date Due   OPHTHALMOLOGY EXAM  09/09/2016   DEXA SCAN  Never done   FOOT EXAM  06/16/2022   COVID-19 Vaccine (6 - 2023-24 season) 07/15/2022   Zoster Vaccines- Shingrix (1 of 2) 08/09/2023 (Originally 01/08/2007)   INFLUENZA VACCINE  06/15/2023   HEMOGLOBIN A1C  07/27/2023   Diabetic kidney evaluation - Urine ACR  02/09/2024   Diabetic kidney evaluation - eGFR measurement  04/25/2024   Medicare Annual Wellness (AWV)  05/08/2024   MAMMOGRAM  09/01/2024   Colonoscopy  11/15/2028   DTaP/Tdap/Td (4 - Td or Tdap) 12/26/2029   Pneumonia Vaccine 14+ Years old  Completed   Hepatitis C Screening  Completed   HPV VACCINES  Aged Out    Health Maintenance  Health Maintenance Due  Topic Date Due   OPHTHALMOLOGY EXAM  09/09/2016   DEXA SCAN  Never done   FOOT EXAM  06/16/2022    COVID-19 Vaccine (6 - 2023-24 season) 07/15/2022    Colorectal cancer screening: Type of screening: Colonoscopy. Completed 2020. Repeat every 10 years  Mammogram status: Completed  . Repeat every year  Bone Density scheduled  Lung Cancer Screening: (Low Dose CT Chest recommended if Age 73-80 years, 20 pack-year currently smoking OR have quit w/in 15years.) does not qualify.   Lung Cancer Screening Referral:   Additional Screening:  Hepatitis C Screening: does not qualify; Completed 2016  Vision Screening: Recommended annual ophthalmology exams for early detection of glaucoma and other disorders of the eye. Is the patient up to date with their annual eye exam?  Yes  Who is the provider or what is the name of the  office in which the patient attends annual eye exams? [place] If pt is not established with a provider, would they like to be referred to a provider to establish care? No .   Dental Screening: Recommended annual dental exams for proper oral hygiene    Community Resource Referral / Chronic Care Management: CRR required this visit?  No   CCM required this visit?  No     Plan:     I have personally reviewed and noted the following in the patient's chart:   Medical and social history Use of alcohol, tobacco or illicit drugs  Current medications and supplements including opioid prescriptions. Patient is not currently taking opioid prescriptions. Functional ability and status Nutritional status Physical activity Advanced directives List of other physicians Hospitalizations, surgeries, and ER visits in previous 12 months Vitals Screenings to include cognitive, depression, and falls Referrals and appointments  In addition, I have reviewed and discussed with patient certain preventive protocols, quality metrics, and best practice recommendations. A written personalized care plan for preventive services as well as general preventive health recommendations were provided to  patient.     Remi Haggard, LPN   1/61/0960   After Visit Summary: (Declined) Due to this being a telephonic visit, with patients personalized plan was offered to patient but patient Declined AVS at this time   Nurse Notes:

## 2023-05-10 ENCOUNTER — Other Ambulatory Visit: Payer: Medicare PPO

## 2023-05-10 DIAGNOSIS — N183 Chronic kidney disease, stage 3 unspecified: Secondary | ICD-10-CM

## 2023-05-11 ENCOUNTER — Encounter: Payer: Self-pay | Admitting: Family Medicine

## 2023-05-11 LAB — BASIC METABOLIC PANEL
BUN/Creatinine Ratio: 10 — ABNORMAL LOW (ref 12–28)
BUN: 12 mg/dL (ref 8–27)
CO2: 20 mmol/L (ref 20–29)
Calcium: 9.7 mg/dL (ref 8.7–10.3)
Chloride: 98 mmol/L (ref 96–106)
Creatinine, Ser: 1.16 mg/dL — ABNORMAL HIGH (ref 0.57–1.00)
Glucose: 325 mg/dL — ABNORMAL HIGH (ref 70–99)
Potassium: 4.7 mmol/L (ref 3.5–5.2)
Sodium: 136 mmol/L (ref 134–144)
eGFR: 52 mL/min/{1.73_m2} — ABNORMAL LOW (ref >59)

## 2023-05-18 DIAGNOSIS — Z794 Long term (current) use of insulin: Secondary | ICD-10-CM | POA: Diagnosis not present

## 2023-05-18 DIAGNOSIS — Z8673 Personal history of transient ischemic attack (TIA), and cerebral infarction without residual deficits: Secondary | ICD-10-CM | POA: Diagnosis not present

## 2023-05-18 DIAGNOSIS — Z809 Family history of malignant neoplasm, unspecified: Secondary | ICD-10-CM | POA: Diagnosis not present

## 2023-05-18 DIAGNOSIS — E785 Hyperlipidemia, unspecified: Secondary | ICD-10-CM | POA: Diagnosis not present

## 2023-05-18 DIAGNOSIS — E119 Type 2 diabetes mellitus without complications: Secondary | ICD-10-CM | POA: Diagnosis not present

## 2023-05-18 DIAGNOSIS — Z7984 Long term (current) use of oral hypoglycemic drugs: Secondary | ICD-10-CM | POA: Diagnosis not present

## 2023-05-18 DIAGNOSIS — I10 Essential (primary) hypertension: Secondary | ICD-10-CM | POA: Diagnosis not present

## 2023-05-18 DIAGNOSIS — Z833 Family history of diabetes mellitus: Secondary | ICD-10-CM | POA: Diagnosis not present

## 2023-05-18 DIAGNOSIS — G4700 Insomnia, unspecified: Secondary | ICD-10-CM | POA: Diagnosis not present

## 2023-06-15 ENCOUNTER — Other Ambulatory Visit: Payer: Self-pay

## 2023-06-15 DIAGNOSIS — E1159 Type 2 diabetes mellitus with other circulatory complications: Secondary | ICD-10-CM

## 2023-06-15 MED ORDER — VALSARTAN 160 MG PO TABS
160.0000 mg | ORAL_TABLET | Freq: Every day | ORAL | 3 refills | Status: DC
Start: 2023-06-15 — End: 2024-04-03

## 2023-07-07 ENCOUNTER — Other Ambulatory Visit: Payer: Self-pay | Admitting: Family Medicine

## 2023-07-07 DIAGNOSIS — E119 Type 2 diabetes mellitus without complications: Secondary | ICD-10-CM

## 2023-07-07 MED ORDER — DROPLET PEN NEEDLES 31G X 5 MM MISC: 3 refills | Status: DC

## 2023-07-20 ENCOUNTER — Other Ambulatory Visit: Payer: Self-pay

## 2023-07-20 MED ORDER — NOVOLOG FLEXPEN 100 UNIT/ML ~~LOC~~ SOPN: 2.0000 [IU] | PEN_INJECTOR | Freq: Every day | SUBCUTANEOUS | 2 refills | Status: DC

## 2023-07-31 ENCOUNTER — Ambulatory Visit (INDEPENDENT_AMBULATORY_CARE_PROVIDER_SITE_OTHER): Payer: Medicare PPO | Admitting: Family Medicine

## 2023-07-31 ENCOUNTER — Encounter: Payer: Self-pay | Admitting: Family Medicine

## 2023-07-31 VITALS — BP 143/86 | HR 97 | Ht 65.0 in | Wt 162.5 lb

## 2023-07-31 DIAGNOSIS — I152 Hypertension secondary to endocrine disorders: Secondary | ICD-10-CM

## 2023-07-31 DIAGNOSIS — E1159 Type 2 diabetes mellitus with other circulatory complications: Secondary | ICD-10-CM | POA: Diagnosis not present

## 2023-07-31 DIAGNOSIS — Z794 Long term (current) use of insulin: Secondary | ICD-10-CM | POA: Diagnosis not present

## 2023-07-31 DIAGNOSIS — E119 Type 2 diabetes mellitus without complications: Secondary | ICD-10-CM | POA: Diagnosis not present

## 2023-07-31 DIAGNOSIS — Z23 Encounter for immunization: Secondary | ICD-10-CM

## 2023-07-31 DIAGNOSIS — M542 Cervicalgia: Secondary | ICD-10-CM

## 2023-07-31 LAB — POCT GLYCOSYLATED HEMOGLOBIN (HGB A1C): HbA1c, POC (controlled diabetic range): 9.5 % — AB (ref 0.0–7.0)

## 2023-07-31 MED ORDER — DROPLET PEN NEEDLES 31G X 5 MM MISC
3 refills | Status: DC
Start: 2023-07-31 — End: 2023-12-15

## 2023-07-31 MED ORDER — DICLOFENAC SODIUM 1 % EX GEL: 2.0000 g | Freq: Four times a day (QID) | CUTANEOUS | 3 refills | Status: AC

## 2023-07-31 MED ORDER — "DROPLET INSULIN SYRINGE 30G X 1/2"" 0.5 ML MISC": 1.0000 | Freq: Four times a day (QID) | 3 refills | Status: AC

## 2023-07-31 MED ORDER — EMPAGLIFLOZIN 10 MG PO TABS
10.0000 mg | ORAL_TABLET | Freq: Every day | ORAL | 3 refills | Status: DC
Start: 2023-07-31 — End: 2023-11-30

## 2023-07-31 MED ORDER — INSULIN GLARGINE 100 UNIT/ML SOLOSTAR PEN
6.0000 [IU] | PEN_INJECTOR | Freq: Every day | SUBCUTANEOUS | 11 refills | Status: DC
Start: 2023-07-31 — End: 2023-12-15

## 2023-07-31 NOTE — Assessment & Plan Note (Signed)
Increased NovoLog from 2 units once a day at dinnertime to 2 units twice a day at lunchtime and dinnertime no other changes to medications were made.  We will reevaluate regimen at 3 months with her next A1c check.  She will also be due for other health maintenance labs at that time.

## 2023-07-31 NOTE — Progress Notes (Signed)
    SUBJECTIVE:   CHIEF COMPLAINT / HPI:   Marium is a 67 yo female here for A1c check and follow up on chronic conditions.   Diabetes: Previously well controlled with sugars in the 90s-100s. Due for diabetic foot exam. [facility] did her diabetic eye exam earlier this year and has yet to send over her records.  A1c today is 9.5, this is an increase from 9.2 3 months ago. HTN: Previously well controlled with current medications.  Elevated in office today but patient brought home blood pressure logs which remain at goal highest blood pressure recorded at home is 138/86 average blood pressures range in the 110-120s/70-80s. CKD IIIa: Last creatinine was 1.16 HLD: Lipids well controlled, recheck due in December.  Care gaps: flu shot/covid, will be administered today  Neck Pain: Intermittent over the last several years. Was previously on tramadol as needed but this was discontinued. Has not tried anything else for the pain recently, but does think her pillows are contributing to her pain. She plans to get a new, firmer pillow.   PERTINENT  PMH / PSH: See above.  OBJECTIVE:   BP (!) 143/86   Pulse 97   Ht 5\' 5"  (1.651 m)   Wt 162 lb 8 oz (73.7 kg)   SpO2 100%   BMI 27.04 kg/m   General: A&O, NAD Neck: Musculature is tense overlying left levator scapulae and trapezius territories.  Full pain-free range of motion.  No radiculopathy tingling numbness or loss of pulses in the distal extremities. Cardiac: RRR, no m/r/g Respiratory: CTAB, normal WOB, no w/c/r GI: Soft, NTTP, non-distended  Extremities: NTTP, no peripheral edema. Diabetic foot exam: 9 out of 9 points felt bilaterally.  No calluses cuts or defects seen on the soles of the feet.  2+ pedal pulses felt bilaterally.  Extremities are warm and well-perfused.  ASSESSMENT/PLAN:   Type 2 diabetes mellitus without complications (HCC) Increased NovoLog from 2 units once a day at dinnertime to 2 units twice a day at lunchtime  and dinnertime no other changes to medications were made.  We will reevaluate regimen at 3 months with her next A1c check.  She will also be due for other health maintenance labs at that time.  Hypertension associated with diabetes (HCC) Blood pressure appears to be well-controlled at home based on patient's records.  Encouraged her to continue taking her blood pressure daily and keep up with her medications.  Neck pain Recommended use of Voltaren topical gel and IcyHot intermittently for her pain.  Patient also reports that she sleeps on her side and her pillows have gone flat.  Recommended that she replace her pillows with something firmer and more supportive.  Also provided patient with a handout of stretches that she can do to help exercise her neck and alleviate muscle tenderness.  Will recheck patient's pain at her next visit or sooner if necessary.   Gerrit Heck, DO [facility]

## 2023-07-31 NOTE — Assessment & Plan Note (Signed)
Recommended use of Voltaren topical gel and IcyHot intermittently for her pain.  Patient also reports that she sleeps on her side and her pillows have gone flat.  Recommended that she replace her pillows with something firmer and more supportive.  Also provided patient with a handout of stretches that she can do to help exercise her neck and alleviate muscle tenderness.  Will recheck patient's pain at her next visit or sooner if necessary.

## 2023-07-31 NOTE — Patient Instructions (Addendum)
It was wonderful to see you today!  Today we discussed your diabetes and your neck pain.  We made the following changes to your medications please review them carefully and make sure you understand how you will take your medications moving forward.  1.  Continue taking your NovoLog insulin 2 units at dinnertime 2.  Begin taking an additional 2 units of NovoLog at lunchtime 3.  Continue taking your Lantus 60 units once a day 4.  Continue taking Jardiance 10 mg and metformin 1000 mg as previously prescribed  We also talked about starting you on Voltaren gel and IcyHot for your neck pain.  Please apply a small amount of Voltaren gel to your neck as needed up to 4 times a day.  If you continue to have pain in between, use the IcyHot in the same way.  I have also attached some stretches that you can do that can help alleviate neck pain.   Please call [phone] with any questions about today's appointment.   If you need any additional refills, please call your pharmacy before calling the office.  Gerrit Heck, DO Family Medicine

## 2023-07-31 NOTE — Assessment & Plan Note (Signed)
Blood pressure appears to be well-controlled at home based on patient's records.  Encouraged her to continue taking her blood pressure daily and keep up with her medications.

## 2023-08-23 ENCOUNTER — Inpatient Hospital Stay
Admission: RE | Admit: 2023-08-23 | Discharge: 2023-08-23 | Disposition: A | Discharge status: Home / Self Care | Payer: Medicare PPO | Source: Ambulatory Visit | Attending: Family Medicine | Admitting: Family Medicine

## 2023-08-23 DIAGNOSIS — E349 Endocrine disorder, unspecified: Secondary | ICD-10-CM | POA: Diagnosis not present

## 2023-08-23 DIAGNOSIS — Z13820 Encounter for screening for osteoporosis: Secondary | ICD-10-CM

## 2023-08-23 DIAGNOSIS — N958 Other specified menopausal and perimenopausal disorders: Secondary | ICD-10-CM | POA: Diagnosis not present

## 2023-08-23 DIAGNOSIS — M8588 Other specified disorders of bone density and structure, other site: Secondary | ICD-10-CM | POA: Diagnosis not present

## 2023-09-11 ENCOUNTER — Other Ambulatory Visit: Payer: Self-pay | Admitting: Family Medicine

## 2023-09-11 DIAGNOSIS — Z1231 Encounter for screening mammogram for malignant neoplasm of breast: Secondary | ICD-10-CM

## 2023-09-26 ENCOUNTER — Telehealth: Payer: Self-pay

## 2023-09-26 DIAGNOSIS — E119 Type 2 diabetes mellitus without complications: Secondary | ICD-10-CM

## 2023-09-26 NOTE — Telephone Encounter (Signed)
Patient calls nurse line due to issues with picking up Novolog prescription.   Directions state to inject 2 units once daily. Patient reports that she has been injecting 2 units at lunch time and 2 units at supper time.   New prescription with updated directions will need to be sent to pharmacy. Pharmacy receives rejection from insurance with current dosing instructions, that it is too early to fill.    Patient also requesting refill on metformin. This has been pended to this encounter.   Forwarding to PCP.   Veronda Prude, RN

## 2023-09-27 MED ORDER — INSULIN ASPART 100 UNIT/ML FLEXPEN
2.0000 [IU] | PEN_INJECTOR | Freq: Two times a day (BID) | SUBCUTANEOUS | 3 refills | Status: DC
Start: 2023-09-27 — End: 2023-12-15

## 2023-09-27 MED ORDER — METFORMIN HCL 1000 MG PO TABS: ORAL_TABLET | ORAL | 1 refills | Status: DC

## 2023-10-02 NOTE — Telephone Encounter (Signed)
Called patient and advised of provider message. Patient reports that she was able to pick up prescriptions yesterday.   Advised that Dr. Hyacinth Meeker would like her to schedule follow up appointment.   Patient states that she will call back to schedule appointment.   Veronda Prude, RN

## 2023-10-09 ENCOUNTER — Ambulatory Visit
Admission: RE | Admit: 2023-10-09 | Discharge: 2023-10-09 | Disposition: A | Discharge status: Home / Self Care | Payer: Medicare PPO | Source: Ambulatory Visit | Attending: Family Medicine | Admitting: Family Medicine

## 2023-10-09 DIAGNOSIS — Z1231 Encounter for screening mammogram for malignant neoplasm of breast: Secondary | ICD-10-CM

## 2023-10-10 ENCOUNTER — Other Ambulatory Visit: Payer: Self-pay

## 2023-10-10 MED ORDER — DROPSAFE ALCOHOL PREP 70 % PADS: MEDICATED_PAD | 10 refills | Status: DC

## 2023-11-13 ENCOUNTER — Ambulatory Visit: Payer: Medicare PPO | Admitting: Family Medicine

## 2023-11-13 ENCOUNTER — Encounter: Payer: Self-pay | Admitting: Family Medicine

## 2023-11-13 VITALS — BP 153/83 | HR 81 | Ht 66.0 in | Wt 164.2 lb

## 2023-11-13 DIAGNOSIS — I152 Hypertension secondary to endocrine disorders: Secondary | ICD-10-CM

## 2023-11-13 DIAGNOSIS — Z794 Long term (current) use of insulin: Secondary | ICD-10-CM

## 2023-11-13 DIAGNOSIS — E1159 Type 2 diabetes mellitus with other circulatory complications: Secondary | ICD-10-CM

## 2023-11-13 DIAGNOSIS — E119 Type 2 diabetes mellitus without complications: Secondary | ICD-10-CM

## 2023-11-13 LAB — POCT GLYCOSYLATED HEMOGLOBIN (HGB A1C): HbA1c, POC (controlled diabetic range): 8.4 % — AB (ref 0.0–7.0)

## 2023-11-13 MED ORDER — DEXCOM G7 SENSOR MISC: 1.0000 | 5 refills | Status: DC

## 2023-11-13 MED ORDER — DEXCOM G7 RECEIVER DEVI: 1.0000 | Freq: Every day | 0 refills | Status: DC

## 2023-11-13 NOTE — Progress Notes (Signed)
    SUBJECTIVE:   CHIEF COMPLAINT / HPI:   A1c and Lipid panel check Increased novolog at last visit to 2 units BID Wants a CGM  Doing well with twice daily 2 units short acting NovoLog.  Also takes 6 units basal insulin daily.  Has had no concerning lows, lowest reading was 86, highest reading was just over 200.  A1c today is 8.4 which represents a drop from 9.5.  She would like to continue on this regimen until her next visit as she is concerned about dropping too low.  Discussed the benefits of continuous glucose monitoring as she was interested in this after seeing them on TV and having a sister who has a CGM.  Given that she is on an insulin regimen, I agree that this would be a good idea.  Is also due for yearly lipid panel.  PERTINENT  PMH / PSH: T2DM, HLD, HTN  OBJECTIVE:   BP (!) 153/83   Pulse 81   Ht 5\' 6"  (1.676 m)   Wt 164 lb 3.2 oz (74.5 kg)   LMP  (LMP Unknown)   SpO2 99%   BMI 26.50 kg/m   General: A&O, NAD HEENT: No sign of trauma, EOM grossly intact Cardiac: RRR, no m/r/g Respiratory: CTAB, normal WOB, no w/c/r Extremities: NTTP, no peripheral edema.  Some notable callusing on the fingers where she checks her blood sugar regularly.   ASSESSMENT/PLAN:   Type 2 diabetes mellitus without complications (HCC) Patient's A1c today was 8.4 which is an improvement from her previous A1c of 9.5.  We will make no adjustments to her insulin regimen today.  She is interested in starting a CGM rather than using a traditional blood glucose monitoring system.  I have placed an order for Dexcom G7 sensor and receiver.  Patient has instructions that if she needs help or guidance with applying the system she should call back into the office.  I have also contacted Dr. Madelon Lips our arm to see Pharmacy doctor, to get his recommendations on getting her set up.  I will follow-up with her in 3 months for a recheck of her A1c or sooner if she needs assistance with her  Dexcom.  Hypertension associated with diabetes (HCC) Readings in office today were elevated above patient's normal.  She continues to have normal blood pressure readings at home.  Of note the temperature in the exam room was extremely uncomfortable.  Will address blood pressure concerns at next visit if applicable.   Gerrit Heck, DO [facility]

## 2023-11-13 NOTE — Patient Instructions (Signed)
It was wonderful to see you today!  Today we discussed [name].  It has come down from 9.5 to 8.4.  This is an excellent accomplishment.  Please continue taking your insulin as previously prescribed.  Per your request I have also called in a continuous glucose monitor called a Dexcom 7.  You should receive a sensor and a reader device.  If you have questions about how to apply this or you would like someone to help you apply your very first sensor please call the office and set up an appointment.  Today we will also check your lipids.  Will call you with these results if we need to make any changes to your medications.  Otherwise, you will get a letter in the mail with your results.  I would like to see you back in 3 months for your next A1c check or sooner if you need help with your Dexcom 7.  Please call [phone] with any questions about today's appointment.   If you need any additional refills, please call your pharmacy before calling the office.  Gerrit Heck, DO Family Medicine

## 2023-11-13 NOTE — Assessment & Plan Note (Addendum)
Patient's A1c today was 8.4 which is an improvement from her previous A1c of 9.5.  We will make no adjustments to her insulin regimen today.  She is interested in starting a CGM rather than using a traditional blood glucose monitoring system.  I have placed an order for Dexcom G7 sensor and receiver.  Patient has instructions that if she needs help or guidance with applying the system she should call back into the office.  I have also contacted Dr. Madelon Lips our arm to see Pharmacy doctor, to get his recommendations on getting her set up.  I will follow-up with her in 3 months for a recheck of her A1c or sooner if she needs assistance with her Dexcom.

## 2023-11-13 NOTE — Assessment & Plan Note (Signed)
Readings in office today were elevated above patient's normal.  She continues to have normal blood pressure readings at home.  Of note the temperature in the exam room was extremely uncomfortable.  Will address blood pressure concerns at next visit if applicable.

## 2023-11-14 ENCOUNTER — Encounter: Payer: Self-pay | Admitting: Family Medicine

## 2023-11-14 LAB — LIPID PANEL
Chol/HDL Ratio: 3 {ratio} (ref 0.0–4.4)
Cholesterol, Total: 157 mg/dL (ref 100–199)
HDL: 53 mg/dL (ref >39)
LDL Chol Calc (NIH): 88 mg/dL (ref 0–99)
Triglycerides: 87 mg/dL (ref 0–149)
VLDL Cholesterol Cal: 16 mg/dL (ref 5–40)

## 2023-11-16 ENCOUNTER — Other Ambulatory Visit (HOSPITAL_COMMUNITY): Payer: Self-pay

## 2023-11-16 ENCOUNTER — Telehealth: Payer: Self-pay | Admitting: Pharmacist

## 2023-11-16 DIAGNOSIS — E119 Type 2 diabetes mellitus without complications: Secondary | ICD-10-CM

## 2023-11-16 DIAGNOSIS — Z794 Long term (current) use of insulin: Secondary | ICD-10-CM

## 2023-11-16 NOTE — Telephone Encounter (Signed)
 Patient contacted for follow-up of CGM initiation  Since last contact patient reports she has not yet received her Dexcom CGM.  She plans to use Receiver.   She plans to call and make appointment with me for set-up upon availability.    Total time with patient call and documentation of interaction: 8 minutes.

## 2023-11-16 NOTE — Telephone Encounter (Signed)
-----   Message from Lucie Pinal sent at 11/13/2023  3:26 PM EST ----- Regarding: Dexcom G7 Hey Dr. Onell Mcmath,  I ordered a CGM for this patient, she is on basal and short acting insulin  and is tired of pricking her fingers. She wants to try setting it up on her own, but she may need help. I am also not sure if her insurance will play nicely, so I may need help getting her set up with the right CGM. I appreciate any and all help.  Thanks, Sheppard Pinal

## 2023-11-17 ENCOUNTER — Other Ambulatory Visit (HOSPITAL_COMMUNITY): Payer: Self-pay

## 2023-11-17 MED ORDER — FREESTYLE LIBRE 3 READER DEVI: 1.0000 | Freq: Once | 0 refills | Status: AC

## 2023-11-17 MED ORDER — FREESTYLE LIBRE 3 PLUS SENSOR MISC: 11 refills | Status: DC

## 2023-11-17 NOTE — Telephone Encounter (Signed)
 Patient returns call to nurse line. She reports that Dexcom 7 is going to cost her $58. She would like to see if there is a cheaper option.   Will forward to Dr. Raymondo Band for further assistance.   Veronda Prude, RN

## 2023-11-17 NOTE — Telephone Encounter (Signed)
 Patient contacted for follow-up of CGM access.  Test claim by Lavern Ku, CPhT revealed $0 copay for both Libre3 sensor and reader.  Contacted patient to explain coverage change and that new prescriptions were being sent to her pharmacy.   We agreed that I would anticipate seeing her next week to initiate her CGM use.    Total time with patient call and documentation of interaction: 12 minutes.

## 2023-11-17 NOTE — Addendum Note (Signed)
 Addended by: Kathrin Ruddy on: 11/17/2023 02:45 PM   Modules accepted: Orders

## 2023-11-20 NOTE — Telephone Encounter (Signed)
 Reviewed and agree with Dr Macky Lower plan.

## 2023-11-30 ENCOUNTER — Ambulatory Visit (INDEPENDENT_AMBULATORY_CARE_PROVIDER_SITE_OTHER): Payer: Medicare HMO | Admitting: Pharmacist

## 2023-11-30 ENCOUNTER — Encounter: Payer: Self-pay | Admitting: Pharmacist

## 2023-11-30 VITALS — BP 129/73 | HR 88 | Wt 160.0 lb

## 2023-11-30 DIAGNOSIS — E119 Type 2 diabetes mellitus without complications: Secondary | ICD-10-CM

## 2023-11-30 DIAGNOSIS — Z794 Long term (current) use of insulin: Secondary | ICD-10-CM | POA: Diagnosis not present

## 2023-11-30 MED ORDER — MIRTAZAPINE 7.5 MG PO TABS: 7.5000 mg | ORAL_TABLET | Freq: Every day | ORAL | 1 refills | Status: DC

## 2023-11-30 MED ORDER — EMPAGLIFLOZIN 10 MG PO TABS: 10.0000 mg | ORAL_TABLET | Freq: Every day | ORAL | 3 refills | Status: DC

## 2023-11-30 NOTE — Assessment & Plan Note (Signed)
Diabetes longstanding and currently fair control. Patient initiating Libre 3 CGM today.  Following education patient was able to synchronize Libre CGM sensors and reader. Medication adherence appears good.  -Continued basal insulin Lantus (insulin glargine) at 6 units once daily.  -Continued rapid insulin Novolog (insulin aspart) at 2 units twice daily with food.  -Continued SGLT2-I Jardiance (empagliflozin) 10mg  daily.  -Continued metformin 1000mg  twice daily   -Extensively discussed pathophysiology of diabetes, recommended lifestyle interventions, dietary effects on blood sugar control.

## 2023-11-30 NOTE — Patient Instructions (Signed)
It was a pleasure seeing you today!  The sensor is small waterproof disc that is placed on the back of the upper arm.  There is a very thin filament that is inserted under the surface of the skin and measures the amount of glucose in the interstitial fluid.  This system collects your sugar levels for up to 14 days and it automatically records the glucose level every 15 minutes. This will show your provider any patterns in your glucose levels.  Please remember... 1. Sensor will last 15 days 2. Sensor should be applied to area away from scarring, tattoos, irritation, and bones. 3. Starting the sensor: 1 hour warm up before BG readings available   4. Scan the sensor at least every 8 hours for the FreeStyle Libre 2. Libre 3 does not require scanning.  5. Hold reader within 1.5 inches of sensor to scan 6. When the blood drop and magnifying glass symbol appears, test fingerstick blood glucose prior to making treatment decisions 7. Do a fingerstick blood glucose test if the sensor readings do not match how you feel 8. Remove sensor prior to magnetic resonance imaging (MRI), computed tomography (CT) scan, or high-frequency electrical heat (diathermy) treatment. 9. Freestyle Libre may be worn through a Industrial/product designer. It may not be exposed to an advanced Imaging Technology (AIT) body scanner (also called a millimeter wave scanner) or the baggage x-ray machine. Instead, ask for hand-wanding or full-body pat-down and visual inspection.  10. Doses of vitamin C (ascorbic acid) >500 mg every day may cause false high readings. 11. Do not submerge more than 3 feet or keep underwater longer than 30 minutes at a time. Gently pat to dry.  12. Store sensor kit between 39 and 77 degrees Farenheit. Can be refrigerated within this temperature range.  Problems with Freestyle Libre sticking? 1. Order Tegaderm I.V. films to place directly over [facility] sensor on arm. 2. May also order Skin Tac from  [facility]. Alcohol swab area you plan to administer Freestyle Libre then let dry. Once dry, apply Skin Tac in a circular motion (with a spot in the middle for sensor without skin tac) and let dry. Once dry you can apply Freestyle Libre   Problems taking off Freestyle Onycha? 1. Remember to try to shower before removing Freestyle Libre 2. Order Tac Away to help remove any extra adhesive left on your skin once you remove Freestyle Libre 3. May also try baby oil to loosen adhesive  Freestyle [facility] Phone number: [phone] Available 7 days a week; excluding holidays 8 AM to 8PM EST  [email or web address]  Please do the following:  If you have any questions or if you believe something has occurred because of this change, call me or your doctor to let one of [place] know.  Continue checking blood sugars at home. It's really important that you record these and bring these in to your next doctor's appointment.  Continue making the lifestyle changes we've discussed together during our visit. Diet and exercise play a significant role in improving your blood sugars.  Follow-up with me on 12/15/2023.   Hypoglycemia or low blood sugar:   Low blood sugar can happen quickly and may become an emergency if not treated right away.   While this shouldn't happen often, it can be brought upon if you skip a meal or do not eat enough. Also, if your insulin or other diabetes medications are dosed too high, this can cause your blood sugar to go to  low.   Warning signs of low blood sugar include: Feeling shaky or dizzy Feeling weak or tired  Excessive hunger Feeling anxious or upset  Sweating even when you aren't exercising  What to do if I experience low blood sugar? Follow the Rule of 15 Check your blood sugar. If lower than 70, proceed to step 2.  Treat with 15 grams of fast acting carbs which is found in 3-4 glucose tablets. If none are available you can try hard candy, 1 tablespoon of sugar or  honey,4 ounces of fruit juice, or 6 ounces of REGULAR soda.  Re-check your sugar in 15 minutes. If it is still below 70, do what you did in step 2 again. If your blood sugar has come back up, go ahead and eat a snack or small meal made up of complex carbs (ex. Whole grains) and protein at this time to avoid recurrence of low blood sugar.

## 2023-11-30 NOTE — Progress Notes (Signed)
    S:     Chief Complaint  Patient presents with   Medication Management    CGM Set-up    68 y.o. female who presents for diabetes evaluation, education, and initiation of Libre 3 CGM.  Majority of visit was spent in teaching, training and initiation of CGM.  PMH is significant for diabetes.  Patient was referred and last seen by Primary Care Provider, Dr. Rondel Baton, on 11/13/2023.   Today, patient arrives in good spirits and presents without any assistance.  Current diabetes medications include: Jardiance (empagliflozin) 10 mg once daily. Novolog U-100 (insulin aspart) 2 units BID, Lantus U-100 (insulin glargine) 6 units once daily. Metformin 1000 mg BID. Current hypertension medications include: valsartan 160 mg once daily Current hyperlipidemia medications include:   Patient reports adherence to taking all medications as prescribed.    O:   Review of Systems  All other systems reviewed and are negative.   Physical Exam Vitals reviewed.  Constitutional:      Appearance: Normal appearance.  Pulmonary:     Effort: Pulmonary effort is normal.  Neurological:     Mental Status: She is alert. Mental status is at baseline.  Psychiatric:        Mood and Affect: Mood normal.        Thought Content: Thought content normal.    Lab Results  Component Value Date   HGBA1C 8.4 (A) 11/13/2023     Vitals:   11/30/23 1330  BP: 129/73  Pulse: 88  SpO2: 100%     Lipid Panel     Component Value Date/Time   CHOL 157 11/13/2023 1535   TRIG 87 11/13/2023 1535   HDL 53 11/13/2023 1535   CHOLHDL 3.0 11/13/2023 1535   CHOLHDL 1.6 06/09/2016 1012   VLDL 15 06/09/2016 1012   LDLCALC 88 11/13/2023 1535   LDLDIRECT 83 04/06/2020 1447   LDLDIRECT 62 05/16/2007 2138    Clinical Atherosclerotic Cardiovascular Disease (ASCVD):  The ASCVD Risk score (Arnett DK, et al., 2019) failed to calculate for the following reasons:   Risk score cannot be calculated because patient has a  medical history suggesting prior/existing ASCVD   Patient is participating in a Managed Medicaid Plan:  Yes     A/P: Diabetes longstanding and currently fair control. Patient initiating Libre 3 CGM today.  Following education patient was able to synchronize Libre CGM sensors and reader. Medication adherence appears good.  -Continued basal insulin Lantus (insulin glargine) at 6 units once daily.  -Continued rapid insulin Novolog (insulin aspart) at 2 units twice daily with food.  -Continued SGLT2-I Jardiance (empagliflozin) 10mg  daily.  -Continued metformin 1000mg  twice daily   -Extensively discussed pathophysiology of diabetes, recommended lifestyle interventions, dietary effects on blood sugar control.     Written patient instructions provided. Patient verbalized understanding of treatment plan.  Total time in face to face counseling 23 minutes.    Follow-up:  Pharmacist 12/15/2023. PCP clinic visit in TBD.  Patient seen with Lavona Mound, PharmD Candidate and Laqueta Jean, PharmD Candidate.

## 2023-12-04 NOTE — Progress Notes (Signed)
Reviewed and agree with Dr Koval's plan.   

## 2023-12-15 ENCOUNTER — Ambulatory Visit: Payer: Medicare HMO | Admitting: Pharmacist

## 2023-12-15 ENCOUNTER — Encounter: Payer: Self-pay | Admitting: Pharmacist

## 2023-12-15 VITALS — BP 117/70 | HR 88 | Wt 160.0 lb

## 2023-12-15 DIAGNOSIS — E1169 Type 2 diabetes mellitus with other specified complication: Secondary | ICD-10-CM | POA: Diagnosis not present

## 2023-12-15 DIAGNOSIS — E785 Hyperlipidemia, unspecified: Secondary | ICD-10-CM | POA: Diagnosis not present

## 2023-12-15 DIAGNOSIS — E119 Type 2 diabetes mellitus without complications: Secondary | ICD-10-CM | POA: Diagnosis not present

## 2023-12-15 DIAGNOSIS — Z794 Long term (current) use of insulin: Secondary | ICD-10-CM | POA: Diagnosis not present

## 2023-12-15 MED ORDER — FLUTICASONE PROPIONATE 50 MCG/ACT NA SUSP: 2.0000 | Freq: Every day | NASAL | 4 refills | Status: AC

## 2023-12-15 MED ORDER — INSULIN GLARGINE 100 UNIT/ML SOLOSTAR PEN: 6.0000 [IU] | PEN_INJECTOR | Freq: Every day | SUBCUTANEOUS | 3 refills | Status: DC

## 2023-12-15 MED ORDER — DROPLET PEN NEEDLES 31G X 5 MM MISC: 3 refills | Status: DC

## 2023-12-15 MED ORDER — INSULIN ASPART 100 UNIT/ML FLEXPEN: 2.0000 [IU] | PEN_INJECTOR | Freq: Three times a day (TID) | SUBCUTANEOUS | 3 refills | Status: DC

## 2023-12-15 NOTE — Assessment & Plan Note (Signed)
Diabetes longstanding and currently with good control, however, CGM reveals higher than anticipated variability with elevated readings  Patient learned that Libre 3 CGM requires frequent recharging (daily).  Reviewed results of CGM report.  Post-breakfast elevation requires prandial coverage.  -Continued basal insulin Lantus (insulin glargine) at 6 units once daily in AM.  Consider dose reduction from 6 to 5 units if any unexplained nocturnal lows <65.  -Increased rapid insulin Novolog (insulin aspart) from 2 units twice daily to THREE times daily with meals.   -Continued SGLT2-I Jardiance (empagliflozin) 10mg  daily.  -Continued metformin 1000mg  twice daily   -Extensively discussed pathophysiology of diabetes, recommended lifestyle interventions, dietary effects on blood sugar control. -Replaced sensor - patient able to complete steps with minimal support.

## 2023-12-15 NOTE — Progress Notes (Signed)
S:     Chief Complaint  Patient presents with   Medication Management    Libre Review - Diabetes   68 y.o. female who presents for diabetes evaluation, education, and management. Patient arrives in good spirits and presents without any assistance.   Patient was referred and last seen by Primary Care Provider, Dr. Rondel Baton, on 11/13/2023.   PMH is significant for diabetes, hyperlipidemia and hypertension.  At last visit, CGM with reader was initiated.   Current diabetes medications include: Jardiance (empagliflozin) 10 mg once daily. Novolog U-100 (insulin aspart) 2 units BID (lunch and dinner), Lantus U-100 (insulin glargine) 6 units once daily in the AM. Metformin 1000 mg BID. Current hypertension medications include: valsartan 160 mg once daily Current hyperlipidemia medications include: atorvastatin 40mg   Patient reports adherence to taking all medications as prescribed.   Do you feel that your medications are working for you? yes Have you been experiencing any side effects to the medications prescribed? yes Do you have any problems obtaining medications due to transportation or finances? no Insurance coverage: [employer]  Patient reports hypoglycemic events on CGM ( <70) but denies any symptomatic episodes  Reported home fasting blood sugars: 100s  Patient reported dietary habits: Eats 3 meals/day Breakfast: coffee, muffin, grits  or oatmeal - ~ 9:00 AM Lunch: Soup, with crackers Dinner: largest meal of the day Snacks: popcorn - plain Drinks: WATER, Coffee, No longer drinking pepsi or any soda  Patient-reported exercise habits: limited to ADLs   O:   Review of Systems  All other systems reviewed and are negative.   Physical Exam Vitals reviewed.  Constitutional:      Appearance: Normal appearance.  Pulmonary:     Effort: Pulmonary effort is normal.  Neurological:     Mental Status: She is alert. Mental status is at baseline.  Psychiatric:        Mood and  Affect: Mood normal.        Behavior: Behavior normal.    Libre3 CGM Download today 12/15/2023 % Time CGM is active: 97% Average Glucose: 136 mg/dL Glucose Management Indicator: 6.6  Glucose Variability: 41.4% (goal <36%) Time in Goal:  - Time in range 70-180: 73% - Time above range: 22% - Time below range: 5% Observed patterns:Elevation post breakfast  (does not currently take prandial insulin novolog with breakfast.    Lab Results  Component Value Date   HGBA1C 8.4 (A) 11/13/2023   Vitals:   12/15/23 1120  BP: 117/70  Pulse: 88  SpO2: 100%    Lipid Panel     Component Value Date/Time   CHOL 157 11/13/2023 1535   TRIG 87 11/13/2023 1535   HDL 53 11/13/2023 1535   CHOLHDL 3.0 11/13/2023 1535   CHOLHDL 1.6 06/09/2016 1012   VLDL 15 06/09/2016 1012   LDLCALC 88 11/13/2023 1535   LDLDIRECT 83 04/06/2020 1447   LDLDIRECT 62 05/16/2007 2138   Patient is participating in a Managed Medicaid Plan:  Yes   A/P: Diabetes longstanding and currently with good control, however, CGM reveals higher than anticipated variability with elevated readings  Patient learned that Libre 3 CGM requires frequent recharging (daily).  Reviewed results of CGM report.  Post-breakfast elevation requires prandial coverage.  -Continued basal insulin Lantus (insulin glargine) at 6 units once daily in AM.  Consider dose reduction from 6 to 5 units if any unexplained nocturnal lows <65.  -Increased rapid insulin Novolog (insulin aspart) from 2 units twice daily to THREE times daily  with meals.   -Continued SGLT2-I Jardiance (empagliflozin) 10mg  daily.  -Continued metformin 1000mg  twice daily   -Extensively discussed pathophysiology of diabetes, recommended lifestyle interventions, dietary effects on blood sugar control. -Replaced sensor - patient able to complete steps with minimal support.   ASCVD risk - in patient with diabetes who has been taking atorvastatin 40mg  for multiple years. Most recent LDL  values > 70.  -Continued atorvastatin 40 mg.  Consider dose escalation to 80mg  daily at next refill.   Hypertension longstanding currently at goal of <130/80 mmHg using valsartan 160mg  daily. - Continue same.   Allergic rhinitis - refilled seasonal allergic rhinitis therapy prior to start of allergy season.  Patient aware to restart treatment prior to symptoms.  Refilled fluticasone NS  Written patient instructions provided. Patient verbalized understanding of treatment plan.  Total time in face to face counseling 28 minutes.    Follow-up:  Pharmacist PRN 2-3 months  PCP clinic visit in March - patient plans to schedule.

## 2023-12-15 NOTE — Patient Instructions (Signed)
It was nice to see you today!  You are doing great on your [employer].  Your goal blood sugar is 80-130 before eating and less than 180 after eating.  Medication Changes: Increase Novolog (mealtime) insulin to 2 units THREE times daily prior to meals  Continue Lantus 6 units once daily in the morning.   Continue all other medication the same.    Keep up the good work with diet and exercise. Aim for a diet full of vegetables, fruit and lean meats (chicken, [place], fish). Try to limit salt intake by eating fresh or frozen vegetables (instead of canned), rinse canned vegetables prior to cooking and do not add any additional salt to meals.

## 2023-12-15 NOTE — Assessment & Plan Note (Signed)
ASCVD risk - in patient with diabetes who has been taking atorvastatin 40mg  for multiple years. Most recent LDL values > 70.  -Continued atorvastatin 40 mg.  Consider dose escalation to 80mg  daily at next refill.

## 2023-12-25 ENCOUNTER — Telehealth: Payer: Self-pay

## 2023-12-25 NOTE — Telephone Encounter (Signed)
 Patient calls nurse line requesting to speak with Dr. Koval.   She reports difficulties with CGM.  Will forward to Koval.

## 2023-12-26 NOTE — Telephone Encounter (Signed)
Patient contacted for follow-up of CGM issue.   Since last contact patient reports that following placement of sensor 1/31 That she bumped the sensor on 2/7 and it came lose. She tried to replace the sensor but determined that it did not work.   She is willing to try and place new sensor.  Provided education RE menu and start new sensor steps.   Patient will attempt placement.  She was instructed to call back to the office is she fails to be successful.  We can then schedule a time to come back for review and replacement.    Total time with patient call and documentation of interaction: 11 minutes.

## 2024-01-05 ENCOUNTER — Other Ambulatory Visit: Payer: Self-pay

## 2024-01-05 DIAGNOSIS — E1169 Type 2 diabetes mellitus with other specified complication: Secondary | ICD-10-CM

## 2024-01-05 MED ORDER — ATORVASTATIN CALCIUM 40 MG PO TABS
40.0000 mg | ORAL_TABLET | Freq: Every day | ORAL | 3 refills | Status: DC
Start: 2024-01-05 — End: 2024-02-09

## 2024-02-01 ENCOUNTER — Telehealth: Payer: Self-pay | Admitting: Family Medicine

## 2024-02-01 NOTE — Telephone Encounter (Signed)
Routed message to PCP. Jacorie Ernsberger, CMA  

## 2024-02-01 NOTE — Telephone Encounter (Signed)
 Patient was identified as falling into the [place] Measure - Diabetes.   Patient was: Appointment scheduled with primary care provider in the next 30 days.

## 2024-02-09 ENCOUNTER — Other Ambulatory Visit: Payer: Self-pay

## 2024-02-09 DIAGNOSIS — Z794 Long term (current) use of insulin: Secondary | ICD-10-CM

## 2024-02-09 DIAGNOSIS — E1169 Type 2 diabetes mellitus with other specified complication: Secondary | ICD-10-CM

## 2024-02-12 MED ORDER — DROPLET PEN NEEDLES 31G X 5 MM MISC
3 refills | Status: DC
Start: 2024-02-12 — End: 2024-05-13

## 2024-02-12 MED ORDER — MIRTAZAPINE 7.5 MG PO TABS: 7.5000 mg | ORAL_TABLET | Freq: Every day | ORAL | 1 refills | Status: DC

## 2024-02-12 MED ORDER — METFORMIN HCL 1000 MG PO TABS
ORAL_TABLET | ORAL | 1 refills | Status: DC
Start: 2024-02-12 — End: 2024-03-28

## 2024-02-12 MED ORDER — INSULIN GLARGINE 100 UNIT/ML SOLOSTAR PEN: 6.0000 [IU] | PEN_INJECTOR | Freq: Every day | SUBCUTANEOUS | 3 refills | Status: DC

## 2024-02-12 MED ORDER — ATORVASTATIN CALCIUM 40 MG PO TABS
40.0000 mg | ORAL_TABLET | Freq: Every day | ORAL | 3 refills | Status: AC
Start: 2024-02-12 — End: ?

## 2024-02-14 ENCOUNTER — Other Ambulatory Visit (HOSPITAL_COMMUNITY): Payer: Self-pay

## 2024-02-20 ENCOUNTER — Other Ambulatory Visit (HOSPITAL_COMMUNITY): Payer: Self-pay

## 2024-02-22 ENCOUNTER — Ambulatory Visit: Admitting: Family Medicine

## 2024-03-07 ENCOUNTER — Ambulatory Visit: Admitting: Family Medicine

## 2024-03-07 ENCOUNTER — Encounter: Payer: Self-pay | Admitting: Family Medicine

## 2024-03-07 VITALS — BP 133/74 | HR 86 | Ht 64.0 in | Wt 162.2 lb

## 2024-03-07 DIAGNOSIS — Z794 Long term (current) use of insulin: Secondary | ICD-10-CM

## 2024-03-07 DIAGNOSIS — E119 Type 2 diabetes mellitus without complications: Secondary | ICD-10-CM

## 2024-03-07 LAB — POCT GLYCOSYLATED HEMOGLOBIN (HGB A1C): HbA1c, POC (controlled diabetic range): 7.4 % — AB (ref 0.0–7.0)

## 2024-03-07 MED ORDER — FREESTYLE LIBRE 3 PLUS SENSOR MISC: 11 refills | Status: AC

## 2024-03-07 NOTE — Progress Notes (Signed)
    SUBJECTIVE:   CHIEF COMPLAINT / HPI:   Diabetes check in -ask about ophtho referral/visit  Doing well, no concerns except needs refill of Libre 3 censors due to a few not working/sticking to her arm properly.  Also wanted to discuss cancer screenings, as her sisters were both recently diagnosed with cancer, one with lung and one with ovarian.  Her sister with lung cancer has never smoked, but is responding well to her chemo, and her sister with ovarian cancer is also responding well.  [name] has no concerning symptoms such as cough, shortness of breath or hemoptysis, nor does she have a prior family history of breast, uterine, lung or ovarian cancers. We discussed that these are likely isolated diagnoses, and that we would watch out for any symptoms that may suggest that she is developing either of these cancers. We went over red flag signs to watch for, and she felt reassured by our discussion.  PERTINENT  PMH / PSH: Diabetes Mellitus  OBJECTIVE:   BP 133/74   Pulse 86   Ht 5\' 4"  (1.626 m)   Wt 162 lb 3.2 oz (73.6 kg)   LMP  (LMP Unknown)   SpO2 100%   BMI 27.84 kg/m   General: Alert, oriented, cooperative. Cardiac:RRR, no m/r/g Respiratory: CTAB, no increased WOB Abdomen: Flat, soft, non-tender  ASSESSMENT/PLAN:   Assessment & Plan Type 2 diabetes mellitus without complication, with long-term current use of insulin  (HCC) -A1c check completed today in office -Urine albumin/creatinine ratio ordered to evaluate kidney function, if patient unable to void today, will re attempt at follow up in June -recommended yearly ophthalmology screening, referral placed -follow up in 3 months    Monica Calandra, DO [facility]

## 2024-03-07 NOTE — Assessment & Plan Note (Addendum)
-  A1c check completed today in office -Urine albumin/creatinine ratio ordered to evaluate kidney function, if patient unable to void today, will re attempt at follow up in June -recommended yearly ophthalmology screening, referral placed -follow up in 3 months

## 2024-03-07 NOTE — Assessment & Plan Note (Deleted)
-  A1c check completed today in office -Urine albumin/creatinine ratio ordered to evaluate kidney function

## 2024-03-07 NOTE — Patient Instructions (Signed)
 It was wonderful to see you today!  Today we discussed your diabetes. [name] has improved from 8.4 to 7.4, which is a fantastic improvement! I have sent in your requested refills for your CGM sensors, which should be available for pick up at your pharmacy. I will see you again in 3 months for your next check in.  Please call [phone] with any questions about today's appointment.   If you need any additional refills, please call your pharmacy before calling the office.  Rayma Calandra, DO Family Medicine

## 2024-03-08 ENCOUNTER — Encounter: Payer: Self-pay | Admitting: Family Medicine

## 2024-03-08 LAB — MICROALBUMIN / CREATININE URINE RATIO
Creatinine, Urine: 60 mg/dL
Microalb/Creat Ratio: 7 mg/g{creat} (ref 0–29)
Microalbumin, Urine: 4.4 ug/mL

## 2024-03-21 ENCOUNTER — Other Ambulatory Visit: Payer: Self-pay

## 2024-03-21 MED ORDER — CARVEDILOL 25 MG PO TABS: 25.0000 mg | ORAL_TABLET | Freq: Two times a day (BID) | ORAL | 1 refills | Status: DC

## 2024-03-28 ENCOUNTER — Other Ambulatory Visit: Payer: Self-pay | Admitting: Family Medicine

## 2024-03-28 DIAGNOSIS — E119 Type 2 diabetes mellitus without complications: Secondary | ICD-10-CM

## 2024-04-01 ENCOUNTER — Other Ambulatory Visit: Payer: Self-pay | Admitting: Family Medicine

## 2024-04-01 DIAGNOSIS — E119 Type 2 diabetes mellitus without complications: Secondary | ICD-10-CM

## 2024-04-03 ENCOUNTER — Other Ambulatory Visit: Payer: Self-pay | Admitting: Family Medicine

## 2024-04-03 DIAGNOSIS — E1159 Type 2 diabetes mellitus with other circulatory complications: Secondary | ICD-10-CM

## 2024-04-09 ENCOUNTER — Other Ambulatory Visit: Payer: Self-pay

## 2024-04-09 DIAGNOSIS — E119 Type 2 diabetes mellitus without complications: Secondary | ICD-10-CM

## 2024-04-09 MED ORDER — INSULIN ASPART 100 UNIT/ML FLEXPEN: 2.0000 [IU] | PEN_INJECTOR | Freq: Three times a day (TID) | SUBCUTANEOUS | 3 refills | Status: DC

## 2024-05-13 ENCOUNTER — Other Ambulatory Visit: Payer: Self-pay | Admitting: Family Medicine

## 2024-05-13 ENCOUNTER — Ambulatory Visit: Payer: Medicare PPO

## 2024-05-13 VITALS — Ht 64.0 in | Wt 162.0 lb

## 2024-05-13 DIAGNOSIS — Z0000 Encounter for general adult medical examination without abnormal findings: Secondary | ICD-10-CM

## 2024-05-13 DIAGNOSIS — E119 Type 2 diabetes mellitus without complications: Secondary | ICD-10-CM

## 2024-05-13 NOTE — Progress Notes (Signed)
 Because this visit was a virtual/telehealth visit,  certain criteria was not obtained, such a blood pressure, CBG if applicable, and timed get up and go. Any medications not marked as taking were not mentioned during the medication reconciliation part of the visit. Any vitals not documented were not able to be obtained due to this being a telehealth visit or patient was unable to self-report a recent blood pressure reading due to a lack of equipment at home via telehealth. Vitals that have been documented are verbally provided by the patient.   Subjective:   [name] is a 68 y.o. who presents for a Medicare Wellness preventive visit.  As a reminder, Annual Wellness Visits don't include a physical exam, and some assessments may be limited, especially if this visit is performed virtually. We may recommend an in-person follow-up visit with your provider if needed.  Visit Complete: Virtual I connected with  [name] on 05/13/24 by a audio enabled telemedicine application and verified that I am speaking with the correct person using two identifiers.  Patient Location: Home  Provider Location: Office/Clinic  I discussed the limitations of evaluation and management by telemedicine. The patient expressed understanding and agreed to proceed.  Vital Signs: Because this visit was a virtual/telehealth visit, some criteria may be missing or patient reported. Any vitals not documented were not able to be obtained and vitals that have been documented are patient reported.  VideoDeclined- This patient declined Librarian, academic. Therefore the visit was completed with audio only.  Persons Participating in Visit: Patient.  AWV Questionnaire: No: Patient Medicare AWV questionnaire was not completed prior to this visit.  Cardiac Risk Factors include: advanced age (>11men, >35 women);diabetes mellitus;dyslipidemia;family history of premature cardiovascular  disease;hypertension;sedentary lifestyle     Objective:    Today's Vitals   05/13/24 1117  Weight: 162 lb (73.5 kg)  Height: 5' 4 (1.626 m)  PainSc: 0-No pain   Body mass index is 27.81 kg/m.     05/13/2024   11:18 AM 03/07/2024    1:37 PM 07/31/2023    1:44 PM 05/09/2023    3:31 PM 04/26/2023    9:53 AM 02/09/2023    2:21 PM 11/10/2022   11:25 AM  Advanced Directives  Does Patient Have a Medical Advance Directive? No No No No No No No  Would patient like information on creating a medical advance directive? No - Patient declined No - Patient declined No - Patient declined No - Patient declined  No - Patient declined No - Patient declined    Current Medications (verified) Outpatient Encounter Medications as of 05/13/2024  Medication Sig   Alcohol  Swabs (DROPSAFE ALCOHOL  PREP) 70 % PADS Use to prep skin for insulin  injections.   aspirin 81 MG tablet Take 81 mg by mouth daily.   atorvastatin  (LIPITOR) 40 MG tablet Take 1 tablet (40 mg total) by mouth daily.   Blood Glucose Monitoring Suppl (ACCU-CHEK GUIDE) w/Device KIT USE AS DIRECTED   carvedilol  (COREG ) 25 MG tablet Take 1 tablet (25 mg total) by mouth 2 (two) times daily with a meal.   Continuous Glucose Sensor (FREESTYLE LIBRE 3 PLUS SENSOR) MISC Change sensor every 15 days.   diclofenac  Sodium (VOLTAREN ) 1 % GEL Apply 2 g topically 4 (four) times daily. (Patient not taking: Reported on 12/15/2023)   empagliflozin  (JARDIANCE ) 10 MG TABS tablet TAKE 1 TABLET BY MOUTH DAILY   fluticasone  (FLONASE ) 50 MCG/ACT nasal spray Place 2 sprays into both nostrils  daily.   glucose blood (ACCU-CHEK GUIDE) test strip USE TO CHECK BLOOD SUGAR UP TO 4 TIMES DAILY.   insulin  aspart (NOVOLOG ) 100 UNIT/ML FlexPen Inject 2 Units into the skin 3 (three) times daily with meals.   insulin  glargine (LANTUS ) 100 UNIT/ML Solostar Pen Inject 6 Units into the skin daily.   Insulin  Pen Needle (DROPLET PEN NEEDLES) 31G X 5 MM MISC Use to administer insulin  4  times daily   Insulin  Syringe-Needle U-100 (DROPLET INSULIN  SYRINGE) 30G X 1/2 0.5 ML MISC Inject 1 Needle into the skin 4 (four) times daily.   Lancets Ultra Fine MISC Use as needed   metFORMIN  (GLUCOPHAGE ) 1000 MG tablet TAKE 1 TABLET BY MOUTH TWICE A DAY WITH A MEAL   mirtazapine  (REMERON ) 7.5 MG tablet TAKE 1 TABLET AT BEDTIME   TRUEplus Lancets 33G MISC USE AS NEEDED   valsartan  (DIOVAN ) 160 MG tablet TAKE 1 TABLET AT BEDTIME (STOP LISINOPRIL )   No facility-administered encounter medications on file as of 05/13/2024.    Allergies (verified) Shellfish allergy, Amlodipine , and Hctz [hydrochlorothiazide ]   History: Past Medical History:  Diagnosis Date   Anemia    Benign heart murmur    Closed nondisplaced fracture of proximal phalanx of left ring finger 04/08/2020   Diabetes mellitus    Gastric ulcer    Hallux rigidus of right foot 08/29/2011   History of colonoscopy    Hyperlipidemia    Hypertension    Stroke (HCC)    2008 - right arm/leg numbness.      Past Surgical History:  Procedure Laterality Date   BREAST CYST EXCISION Bilateral    BTL  1988   COLONOSCOPY  01/13/2004   Colonoscopy - normal, diverticulosis    ENDOMETRIAL BIOPSY     Hx of endometrial Bx 2004 - negative for hyperplasia or malignancy. Patient does not remember reason for testing.   ESOPHAGOGASTRODUODENOSCOPY     normal   L and R breast lumpectomies benign  1970   Family History  Problem Relation Age of Onset   Diabetes Mother    Alcohol  abuse Father    Heart disease Father        No details   Diabetes Sister    Colon cancer Neg Hx    Esophageal cancer Neg Hx    Rectal cancer Neg Hx    Stomach cancer Neg Hx    Social History   Socioeconomic History   Marital status: Single    Spouse name: Not on file   Number of children: 3   Years of education: Not on file   Highest education level: 10th grade  Occupational History   Occupation: Equities trader: [employer]     Comment: at a mental health facility  Tobacco Use   Smoking status: Never   Smokeless tobacco: Former    Types: Snuff    Quit date: 01/28/2013   Tobacco comments:    Quit for 1.5 months in past. Current quit date 01/28/2013  Vaping Use   Vaping status: Never Used  Substance and Sexual Activity   Alcohol  use: Not Currently    Comment: hx excessive ETOH, quit 02/2007   Drug use: No   Sexual activity: Not Currently  Other Topics Concern   Not on file  Social History Narrative   Lives alone.  Three daughters.   Seven grands.     Social Drivers of Health   Financial Resource Strain: Low Risk  (05/13/2024)   Overall Financial  Resource Strain (CARDIA)    Difficulty of Paying Living Expenses: Not hard at all  Food Insecurity: No Food Insecurity (05/13/2024)   Hunger Vital Sign    Worried About Running Out of Food in the Last Year: Never true    Ran Out of Food in the Last Year: Never true  Transportation Needs: No Transportation Needs (05/13/2024)   PRAPARE - Administrator, Civil Service (Medical): No    Lack of Transportation (Non-Medical): No  Physical Activity: Inactive (05/13/2024)   Exercise Vital Sign    Days of Exercise per Week: 0 days    Minutes of Exercise per Session: 0 min  Stress: No Stress Concern Present (05/13/2024)   [employer] of Occupational Health - Occupational Stress Questionnaire    Feeling of Stress: Not at all  Social Connections: Socially Isolated (05/13/2024)   Social Connection and Isolation Panel    Frequency of Communication with Friends and Family: More than three times a week    Frequency of Social Gatherings with Friends and Family: Once a week    Attends Religious Services: Never    Database administrator or Organizations: No    Attends Banker Meetings: Never    Marital Status: Never married    Tobacco Counseling Counseling given: Not Answered Tobacco comments: Quit for 1.5 months in past. Current quit date  01/28/2013    Clinical Intake:  Pre-visit preparation completed: Yes  Pain : No/denies pain Pain Score: 0-No pain     BMI - recorded: 27.81 Nutritional Status: BMI 25 -29 Overweight Nutritional Risks: None Diabetes: Yes CBG done?: No Did pt. bring in CBG monitor from home?: No  Lab Results  Component Value Date   HGBA1C 7.4 (A) 03/07/2024   HGBA1C 8.4 (A) 11/13/2023   HGBA1C 9.5 (A) 07/31/2023     How often do you need to have someone help you when you read instructions, pamphlets, or other written materials from your doctor or pharmacy?: 1 - Never  Interpreter Needed?: No  Information entered by :: Haliyah Fryman N. Jettson Crable, LPN.   Activities of Daily Living     05/13/2024   11:21 AM  In your present state of health, do you have any difficulty performing the following activities:  Hearing? 0  Vision? 0  Difficulty concentrating or making decisions? 0  Comment BSE: Puzzles  Walking or climbing stairs? 0  Dressing or bathing? 0  Doing errands, shopping? 0  Preparing Food and eating ? N  Using the Toilet? N  In the past six months, have you accidently leaked urine? N  Do you have problems with loss of bowel control? N  Managing your Medications? N  Managing your Finances? N  Housekeeping or managing your Housekeeping? N    Patient Care Team: Cleotilde Lukes, DO as PCP - General (Family Medicine) Lavona Agent, MD as PCP - Cardiology (Cardiology) [facility], [place] as Consulting Physician (Optometry)  I have updated your Care Teams any recent Medical Services you may have received from other providers in the past year.     Assessment:   This is a routine wellness examination for [name].  Hearing/Vision screen Hearing Screening - Comments:: Denies hearing difficulties.  Vision Screening - Comments:: Wears reading glasses - up to date with routine eye exams with [facility]    Goals Addressed             This Visit's Progress     05/13/2024: Stay independent.  Depression Screen     05/13/2024   11:22 AM 11/13/2023    3:02 PM 07/31/2023    1:44 PM 05/09/2023    3:36 PM 04/26/2023    9:52 AM 02/09/2023    2:21 PM 11/10/2022   11:26 AM  PHQ 2/9 Scores  PHQ - 2 Score 0 2 3 0 2 3 2   PHQ- 9 Score 0 4 5 0 7 9 3     Fall Risk     05/13/2024   11:20 AM 11/13/2023    2:55 PM 07/31/2023    1:44 PM 05/09/2023    3:30 PM 04/26/2023    9:53 AM  Fall Risk   Falls in the past year? 0 0 0 0 0  Number falls in past yr: 0 0 0 0 0  Injury with Fall? 0 0 0 0 0  Risk for fall due to : No Fall Risks      Follow up Falls evaluation completed   Falls evaluation completed;Education provided;Falls prevention discussed     MEDICARE RISK AT HOME:  Medicare Risk at Home Any stairs in or around the home?: Yes (LIVES IN APARTMENT/2ND FLOOR) If so, are there any without handrails?: No Home free of loose throw rugs in walkways, pet beds, electrical cords, etc?: Yes Adequate lighting in your home to reduce risk of falls?: Yes Life alert?: No Use of a cane, walker or w/c?: No Grab bars in the bathroom?: No Shower chair or bench in shower?: No Elevated toilet seat or a handicapped toilet?: No  TIMED UP AND GO:  Was the test performed?  No  Cognitive Function: 6CIT completed    05/13/2024   11:22 AM  MMSE - Mini Mental State Exam  Not completed: Unable to complete        05/13/2024   11:42 AM 05/09/2023    3:32 PM  6CIT Screen  What Year? 0 points 0 points  What month? 0 points 0 points  What time? 0 points 0 points  Count back from 20 0 points 0 points  Months in reverse 0 points 4 points  Repeat phrase 0 points 10 points  Total Score 0 points 14 points    Immunizations Immunization History  Administered Date(s) Administered   Fluad Quad(high Dose 65+) 08/18/2022   Fluad Trivalent(High Dose 65+) 07/31/2023   Influenza Split 08/29/2011, 10/15/2012   Influenza Whole 10/29/2007, 08/06/2008, 08/28/2009,  08/23/2010   Influenza,inj,Quad PF,6+ Mos 08/16/2013, 07/30/2014, 07/16/2015, 12/14/2016, 09/13/2017, 09/18/2018, 12/27/2019, 09/24/2020, 08/27/2021   PFIZER Comirnaty(Gray Top)Covid-19 Tri-Sucrose Vaccine 06/16/2021   PFIZER(Purple Top)SARS-COV-2 Vaccination 02/02/2020, 02/23/2020, 09/27/2020   PNEUMOCOCCAL CONJUGATE-20 10/27/2021   Pfizer Covid-19 Vaccine Bivalent Booster 50yrs & up 10/11/2021   Pfizer(Comirnaty)Fall Seasonal Vaccine 12 years and older 07/31/2023   Pneumococcal Polysaccharide-23 10/14/1998, 07/30/2014   Td 02/13/1999, 12/16/2009   Tdap 12/27/2019    Screening Tests Health Maintenance  Topic Date Due   Zoster Vaccines- Shingrix (1 of 2) Never done   OPHTHALMOLOGY EXAM  09/09/2016   COVID-19 Vaccine (7 - 2024-25 season) 01/28/2024   Diabetic kidney evaluation - eGFR measurement  05/09/2024   HEMOGLOBIN A1C  06/06/2024   INFLUENZA VACCINE  06/14/2024   FOOT EXAM  07/30/2024   Diabetic kidney evaluation - Urine ACR  03/07/2025   Medicare Annual Wellness (AWV)  05/13/2025   MAMMOGRAM  10/08/2025   Colonoscopy  11/15/2028   DTaP/Tdap/Td (4 - Td or Tdap) 12/26/2029   Pneumococcal Vaccine: 50+ Years  Completed   DEXA SCAN  Completed  Hepatitis C Screening  Completed   Hepatitis B Vaccines  Aged Out   HPV VACCINES  Aged Out   Meningococcal B Vaccine  Aged Out    Health Maintenance  Health Maintenance Due  Topic Date Due   Zoster Vaccines- Shingrix (1 of 2) Never done   OPHTHALMOLOGY EXAM  09/09/2016   COVID-19 Vaccine (7 - 2024-25 season) 01/28/2024   Diabetic kidney evaluation - eGFR measurement  05/09/2024   Health Maintenance Items Addressed: Yes Patient aware of current care gaps.  Immunization record was verified by [employer]. Patient is due for eye exam, diabetic kidney evaluation-Urine ACR, Shingrix and Covid vaccine.  Additional Screening:  Vision Screening: Recommended annual ophthalmology exams for early detection of glaucoma and other disorders of the  eye. Would you like a referral to an eye doctor? No  Referral placed at last office visit.  Dental Screening: Recommended annual dental exams for proper oral hygiene  Community Resource Referral / Chronic Care Management: CRR required this visit?  No   CCM required this visit?  No   Plan:    I have personally reviewed and noted the following in the patient's chart:   Medical and social history Use of alcohol , tobacco or illicit drugs  Current medications and supplements including opioid prescriptions. Patient is not currently taking opioid prescriptions. Functional ability and status Nutritional status Physical activity Advanced directives List of other physicians Hospitalizations, surgeries, and ER visits in previous 12 months Vitals Screenings to include cognitive, depression, and falls Referrals and appointments  In addition, I have reviewed and discussed with patient certain preventive protocols, quality metrics, and best practice recommendations. A written personalized care plan for preventive services as well as general preventive health recommendations were provided to patient.   Monica LOISE Fuller, LPN   3/69/7974   After Visit Summary: (Declined) Due to this being a telephonic visit, with patients personalized plan was offered to patient but patient Declined AVS at this time   Notes: Patient aware of current care gaps.  Immunization record was verified by [employer]. Patient is due for eye exam, diabetic kidney evaluation-Urine ACR, Shingrix and Covid vaccine. Patient awaiting call from [facility] to schedule vision exam.

## 2024-05-13 NOTE — Patient Instructions (Signed)
 [name] , Thank you for taking time out of your busy schedule to complete your Annual Wellness Visit with me. I enjoyed our conversation and look forward to speaking with you again next year. I, as well as your care team,  appreciate your ongoing commitment to your health goals. Please review the following plan we discussed and let me know if I can assist you in the future. Your Game plan/ To Do List    Referrals: If you haven't heard from the office you've been referred to, please reach out to them at the phone provided.  [facility]  [address]  [place] [address]  Call: [phone]  Follow up Visits: Next Medicare AWV with our clinical staff: 05/15/2025 at 11:10 a.m. phone visit with Nurse Health Advisor   Have you seen your provider in the last 6 months (3 months if uncontrolled diabetes)? Yes Next Office Visit with your provider: Due July 2025  Clinician Recommendations:  Aim for 30 minutes of exercise or brisk walking, 6-8 glasses of water, and 5 servings of fruits and vegetables each day.       This is a list of the screening recommended for you and due dates:  Health Maintenance  Topic Date Due   Zoster (Shingles) Vaccine (1 of 2) Never done   Eye exam for diabetics  09/09/2016   COVID-19 Vaccine (7 - 2024-25 season) 01/28/2024   Yearly kidney function blood test for diabetes  05/09/2024   Hemoglobin A1C  06/06/2024   Flu Shot  06/14/2024   Complete foot exam   07/30/2024   Yearly kidney health urinalysis for diabetes  03/07/2025   Medicare Annual Wellness Visit  05/13/2025   Mammogram  10/08/2025   Colon Cancer Screening  11/15/2028   DTaP/Tdap/Td vaccine (4 - Td or Tdap) 12/26/2029   Pneumococcal Vaccine for age over 45  Completed   DEXA scan (bone density measurement)  Completed   Hepatitis C Screening  Completed   Hepatitis B Vaccine  Aged Out   HPV Vaccine  Aged Out   Meningitis B Vaccine  Aged Out    Advanced  directives: (Declined) Advance directive discussed with you today. Even though you declined this today, please call our office should you change your mind, and we can give you the proper paperwork for you to fill out. Advance Care Planning is important because it:  [x]  Makes sure you receive the medical care that is consistent with your values, goals, and preferences  [x]  It provides guidance to your family and loved ones and reduces their decisional burden about whether or not they are making the right decisions based on your wishes.  Follow the link provided in your after visit summary or read over the paperwork we have mailed to you to help you started getting your Advance Directives in place. If you need assistance in completing these, please reach out to us  so that we can help you!  See attachments for Preventive Care and Fall Prevention Tips.

## 2024-06-04 ENCOUNTER — Ambulatory Visit: Admitting: Family Medicine

## 2024-06-04 NOTE — Progress Notes (Deleted)
    SUBJECTIVE:   CHIEF COMPLAINT / HPI:   Hemoglobin A1c check,   PERTINENT  PMH / PSH: ***  OBJECTIVE:   LMP  (LMP Unknown)   ***  ASSESSMENT/PLAN:   Assessment & Plan      Lucie Pinal, DO [facility]

## 2024-06-18 ENCOUNTER — Ambulatory Visit: Admitting: Family Medicine

## 2024-06-18 ENCOUNTER — Encounter: Payer: Self-pay | Admitting: Family Medicine

## 2024-06-18 VITALS — BP 115/62 | HR 88 | Ht 64.0 in | Wt 160.1 lb

## 2024-06-18 DIAGNOSIS — Z794 Long term (current) use of insulin: Secondary | ICD-10-CM | POA: Diagnosis not present

## 2024-06-18 DIAGNOSIS — E119 Type 2 diabetes mellitus without complications: Secondary | ICD-10-CM

## 2024-06-18 LAB — POCT GLYCOSYLATED HEMOGLOBIN (HGB A1C): HbA1c, POC (controlled diabetic range): 7.9 % — AB (ref 0.0–7.0)

## 2024-06-18 MED ORDER — INSULIN GLARGINE 100 UNIT/ML SOLOSTAR PEN: 3.0000 [IU] | PEN_INJECTOR | Freq: Every day | SUBCUTANEOUS | Status: AC

## 2024-06-18 NOTE — Assessment & Plan Note (Signed)
-  given overnight lows, will reduce LAI from 6 units to 3 units -continue all other medications at current doses -A1c today is 7.9, likely reflecting prior poor control, CGM data indicates improved control over the last 3 weeks. -follow up in one week, consider adjusting meal time insulin  to cover for daytime highs.

## 2024-06-18 NOTE — Patient Instructions (Addendum)
 It was wonderful to see you today!  Your overall blood sugars are looking good.  Because you have been having some concerning lows overnight I have reduced your long-acting insulin  from 6 units to 3 units.  Please follow-up with me in 1 week to recheck and see how your blood sugars have responded.  Since you have not heard from the ophthalmology office I have included their information below.  Please call them to schedule your appointment at your earliest convenience.  Referral sent to: [facility] [address]  [phone]  You have any further questions or need any refills please do not hesitate to reach out either through MyChart or by calling the office at [phone]  Best, Dr. CANDIE Pinal

## 2024-06-18 NOTE — Progress Notes (Signed)
    SUBJECTIVE:   CHIEF COMPLAINT / HPI:   Hemoglobin A1c check, doing well no complaints. Having lows overnight- averaging between 70 and 80, but has been as low as 67.  PERTINENT  PMH / PSH: Type 2 DM  OBJECTIVE:   BP 115/62   Pulse 88   Ht 5' 4 (1.626 m)   Wt 160 lb 2 oz (72.6 kg)   LMP  (LMP Unknown)   SpO2 100%   BMI 27.49 kg/m   General: A&O, NAD HEENT: No sign of trauma, EOM grossly intact Cardiac: RRR, no m/r/g Respiratory: CTAB, normal WOB, no w/c/r  ASSESSMENT/PLAN:   Assessment & Plan Type 2 diabetes mellitus without complication, with long-term current use of insulin  (HCC) -given overnight lows, will reduce LAI from 6 units to 3 units -continue all other medications at current doses -A1c today is 7.9, likely reflecting prior poor control, CGM data indicates improved control over the last 3 weeks. -follow up in one week, consider adjusting meal time insulin  to cover for daytime highs.    Lucie Pinal, DO [facility]

## 2024-06-19 ENCOUNTER — Ambulatory Visit: Payer: Self-pay | Admitting: Family Medicine

## 2024-06-19 ENCOUNTER — Ambulatory Visit: Admitting: Family Medicine

## 2024-06-19 VITALS — BP 146/81 | HR 87 | Temp 98.9°F | Ht 64.0 in | Wt 159.8 lb

## 2024-06-19 DIAGNOSIS — R300 Dysuria: Secondary | ICD-10-CM | POA: Diagnosis not present

## 2024-06-19 LAB — BASIC METABOLIC PANEL WITH GFR
BUN/Creatinine Ratio: 10 — ABNORMAL LOW (ref 12–28)
BUN: 13 mg/dL (ref 8–27)
CO2: 19 mmol/L — ABNORMAL LOW (ref 20–29)
Calcium: 9.9 mg/dL (ref 8.7–10.3)
Chloride: 97 mmol/L (ref 96–106)
Creatinine, Ser: 1.32 mg/dL — ABNORMAL HIGH (ref 0.57–1.00)
Glucose: 122 mg/dL — ABNORMAL HIGH (ref 70–99)
Potassium: 5.2 mmol/L (ref 3.5–5.2)
Sodium: 136 mmol/L (ref 134–144)
eGFR: 44 mL/min/{1.73_m2} — ABNORMAL LOW

## 2024-06-19 MED ORDER — NITROFURANTOIN MONOHYD MACRO 100 MG PO CAPS: 100.0000 mg | ORAL_CAPSULE | Freq: Two times a day (BID) | ORAL | 0 refills | Status: AC

## 2024-06-19 NOTE — Patient Instructions (Addendum)
 It was great to see you! Thank you for allowing me to participate in your care!  Our plans for today:  - I suspect you have a urinary tract infection, I have sent the antibiotic Macrobid  to your pharmacy, you will take this twice daily for 7 days. - I will let you know the results of your urine test.   Please arrive 15 minutes PRIOR to your next scheduled appointment time! If you do not, this affects OTHER patients' care.  Take care and seek immediate care sooner if you develop any concerns.   Ozell Provencal, MD, PGY-3 [facility] 10:06 AM 06/19/2024  [facility]

## 2024-06-19 NOTE — Progress Notes (Signed)
    SUBJECTIVE:   CHIEF COMPLAINT / HPI: Dysuria  Had itching and burning last night while she was urinating. No frequency. Burning happened again this morning. No flank pain or suprapubic pain No change in smell No blood in urine Took OTC UTI medicine which turned urine orange No vaginal irritation or discharge Last had urinary tract infection last year  PERTINENT  PMH / PSH: HLD, type 2 diabetes, smokeless tobacco use, hypertension, history of TIA, CKD 3  OBJECTIVE:   BP (!) 146/81   Pulse 87   Temp 98.9 F (37.2 C)   Ht 5' 4 (1.626 m)   Wt 159 lb 12.8 oz (72.5 kg)   LMP  (LMP Unknown)   SpO2 98%   BMI 27.43 kg/m   General: NAD, well appearing Neuro: A&O Respiratory: normal WOB on RA Extremities: Moving all 4 extremities equally Abdomen: soft, nontender, no rebound or guarding, no flank pain on palpation  ASSESSMENT/PLAN:   Assessment & Plan Dysuria Clinically consistent with uncomplicated UTI.  Will treat empirically with Macrobid  100 mg twice daily for 7 days. Unable to perform UA as patient has been taking AZO.  Given first UTI in years, no role for culture today.  Return if symptoms worsen or fail to improve.  Ozell Provencal, MD [facility]

## 2024-07-01 NOTE — Progress Notes (Deleted)
    SUBJECTIVE:   CHIEF COMPLAINT / HPI:   Blood sugar recheck  PERTINENT  PMH / PSH: ***  OBJECTIVE:   LMP  (LMP Unknown)   ***  ASSESSMENT/PLAN:   Assessment & Plan      Lucie Pinal, DO [facility]

## 2024-07-02 ENCOUNTER — Ambulatory Visit: Admitting: Family Medicine

## 2024-08-05 ENCOUNTER — Encounter: Payer: Self-pay | Admitting: Family Medicine

## 2024-08-05 ENCOUNTER — Ambulatory Visit (INDEPENDENT_AMBULATORY_CARE_PROVIDER_SITE_OTHER): Admitting: Family Medicine

## 2024-08-05 VITALS — BP 125/71 | HR 83 | Ht 64.0 in | Wt 159.0 lb

## 2024-08-05 DIAGNOSIS — Z794 Long term (current) use of insulin: Secondary | ICD-10-CM | POA: Diagnosis not present

## 2024-08-05 DIAGNOSIS — Z23 Encounter for immunization: Secondary | ICD-10-CM | POA: Diagnosis not present

## 2024-08-05 DIAGNOSIS — E119 Type 2 diabetes mellitus without complications: Secondary | ICD-10-CM | POA: Diagnosis not present

## 2024-08-05 MED ORDER — EMPAGLIFLOZIN 10 MG PO TABS: 10.0000 mg | ORAL_TABLET | Freq: Every day | ORAL | 3 refills | Status: AC

## 2024-08-05 NOTE — Progress Notes (Signed)
    SUBJECTIVE:   CHIEF COMPLAINT / HPI:   Diabetes follow up, due for annual  foot exam Decreased lantus  at last visit to 3units daily  Reports no further concerning lows, on reviewing her glucometer readings it appears that she spends most of her time in range at this time with a more consistent spike at lunchtime.  PERTINENT  PMH / PSH: T2dm  OBJECTIVE:   BP 125/71   Pulse 83   Ht 5' 4 (1.626 m)   Wt 159 lb (72.1 kg)   LMP  (LMP Unknown)   SpO2 99%   BMI 27.29 kg/m   General: Well-appearing, no distress Respiratory: Normal rate, unlabored breathing  ASSESSMENT/PLAN:   Assessment & Plan Type 2 diabetes mellitus without complication, with long-term current use of insulin  (HCC) - Stable, well-controlled -Increase lunchtime insulin  aspart to 4 units -Continue daily Lantus  3 units, 2 units aspart at breakfast and dinner -Refilled Jardiance  -Follow-up in 4 weeks Encounter for immunization - Influenza vaccine administered today   Lucie Pinal, DO [facility]

## 2024-08-05 NOTE — Patient Instructions (Addendum)
 It was wonderful to see you today!  We made one adjustment to your short acting insulin  today. We increased your lunchtime dose from 2 units to 4 units, and all of your other doses will stay the same. We will follow up again in 4 weeks to make sure that this regimen is working well.   Please call [phone] with any questions about today's appointment.   If you need any additional refills, please call your pharmacy before calling the office.  Lucie Pinal, DO Family Medicine

## 2024-08-05 NOTE — Assessment & Plan Note (Signed)
-   Stable, well-controlled -Increase lunchtime insulin  aspart to 4 units -Continue daily Lantus  3 units, 2 units aspart at breakfast and dinner -Refilled Jardiance  -Follow-up in 4 weeks

## 2024-08-28 ENCOUNTER — Other Ambulatory Visit: Payer: Self-pay | Admitting: Family Medicine

## 2024-08-28 DIAGNOSIS — E119 Type 2 diabetes mellitus without complications: Secondary | ICD-10-CM

## 2024-09-02 ENCOUNTER — Ambulatory Visit: Payer: Self-pay | Admitting: Family Medicine

## 2024-09-02 DIAGNOSIS — N959 Unspecified menopausal and perimenopausal disorder: Secondary | ICD-10-CM | POA: Diagnosis not present

## 2024-09-02 DIAGNOSIS — Z833 Family history of diabetes mellitus: Secondary | ICD-10-CM | POA: Diagnosis not present

## 2024-09-02 DIAGNOSIS — K5900 Constipation, unspecified: Secondary | ICD-10-CM | POA: Diagnosis not present

## 2024-09-02 DIAGNOSIS — E1122 Type 2 diabetes mellitus with diabetic chronic kidney disease: Secondary | ICD-10-CM | POA: Diagnosis not present

## 2024-09-02 DIAGNOSIS — Z7982 Long term (current) use of aspirin: Secondary | ICD-10-CM | POA: Diagnosis not present

## 2024-09-02 DIAGNOSIS — N1832 Chronic kidney disease, stage 3b: Secondary | ICD-10-CM | POA: Diagnosis not present

## 2024-09-02 DIAGNOSIS — I129 Hypertensive chronic kidney disease with stage 1 through stage 4 chronic kidney disease, or unspecified chronic kidney disease: Secondary | ICD-10-CM | POA: Diagnosis not present

## 2024-09-02 DIAGNOSIS — G4700 Insomnia, unspecified: Secondary | ICD-10-CM | POA: Diagnosis not present

## 2024-09-02 DIAGNOSIS — E785 Hyperlipidemia, unspecified: Secondary | ICD-10-CM | POA: Diagnosis not present

## 2024-09-02 DIAGNOSIS — Z794 Long term (current) use of insulin: Secondary | ICD-10-CM | POA: Diagnosis not present

## 2024-09-10 ENCOUNTER — Ambulatory Visit: Admitting: Family Medicine

## 2024-09-10 ENCOUNTER — Encounter: Payer: Self-pay | Admitting: Family Medicine

## 2024-09-10 DIAGNOSIS — Z794 Long term (current) use of insulin: Secondary | ICD-10-CM | POA: Diagnosis not present

## 2024-09-10 DIAGNOSIS — E119 Type 2 diabetes mellitus without complications: Secondary | ICD-10-CM | POA: Diagnosis not present

## 2024-09-10 MED ORDER — INSULIN ASPART 100 UNIT/ML FLEXPEN: 2.0000 [IU] | PEN_INJECTOR | Freq: Three times a day (TID) | SUBCUTANEOUS | 3 refills | Status: DC

## 2024-09-10 NOTE — Patient Instructions (Signed)
 It was wonderful to see you today!  We made the following adjustments to your medication regimen:  Stopped your long acting insulin . If you see a first morning sugar >120, restart the long acting at 2 units Increased your short acting insulin  as below 2 units with breakfast 4 units with lunch 4 units with dinner  If any of your labs are abnormal you will receive a phone call from me to discuss follow up, otherwise your results will be available in MyChart within the next few days.   Please reach out to your pharmacy for any medication refills.   Be Well, Dr. Lucie Pinal, DO

## 2024-09-10 NOTE — Assessment & Plan Note (Signed)
-  discontinue long acting insulin  considering ongoing lows - increased SAI to 4 units at lunch and dinner - breakfast SAI to remain 2 units - no changes to metformin  or jardiance  today - repeat A1c at follow up next month

## 2024-09-10 NOTE — Progress Notes (Signed)
    SUBJECTIVE:   CHIEF COMPLAINT / HPI:  Patient is doing well. She reports that she continues to have lows in the 60s occasionally overnight, and has verified them with her regular glucometer. She has been asymptomatic during these lows, but was awakened by her CBG alarming.   PERTINENT  PMH / PSH: T2DM  OBJECTIVE:   BP 116/74   Pulse 91   Wt 153 lb (69.4 kg)   LMP  (LMP Unknown)   SpO2 99%   BMI 26.26 kg/m   General: A&O, NAD Cardiac: RRR, no m/r/g Respiratory: CTAB, normal WOB, no w/c/r  Extremities: NTTP, no peripheral edema.  Diabetic foot exam completed today, documented in the screening tab  ASSESSMENT/PLAN:   Assessment & Plan Type 2 diabetes mellitus without complication, with long-term current use of insulin  (HCC) -discontinue long acting insulin  considering ongoing lows - increased SAI to 4 units at lunch and dinner - breakfast SAI to remain 2 units - no changes to metformin  or jardiance  today - repeat A1c at follow up next month   Lucie Pinal, DO [facility]

## 2024-09-23 ENCOUNTER — Other Ambulatory Visit: Payer: Self-pay | Admitting: Family Medicine

## 2024-10-07 ENCOUNTER — Ambulatory Visit: Payer: Self-pay | Admitting: Family Medicine

## 2024-10-07 ENCOUNTER — Encounter: Payer: Self-pay | Admitting: Family Medicine

## 2024-10-07 VITALS — BP 116/65 | HR 90 | Ht 64.0 in | Wt 156.2 lb

## 2024-10-07 DIAGNOSIS — Z794 Long term (current) use of insulin: Secondary | ICD-10-CM | POA: Diagnosis not present

## 2024-10-07 DIAGNOSIS — E119 Type 2 diabetes mellitus without complications: Secondary | ICD-10-CM

## 2024-10-07 LAB — POCT GLYCOSYLATED HEMOGLOBIN (HGB A1C): HbA1c, POC (controlled diabetic range): 8.5 % — AB (ref 0.0–7.0)

## 2024-10-07 NOTE — Progress Notes (Signed)
    SUBJECTIVE:   CHIEF COMPLAINT / HPI:   Coming in for diabetes follow up. Due for A1c, collected at start of visit.   Last visit: discontinued long acting insulin  dt overnight lows - increased lunch and dinner SAI to 4u - kept breakfast SAI at 2u - no changes to metformin  or jardiance   Overnight sugars have remained stable, less than 120 every day since discontinuing long-acting insulin .  Lunchtime sugars continue to maintain out of range despite increase.  Dinnertime sugars appropriate  PERTINENT  PMH / PSH: T2DM  OBJECTIVE:   BP 116/65   Pulse 90   Ht 5' 4 (1.626 m)   Wt 156 lb 3.2 oz (70.9 kg)   LMP  (LMP Unknown)   SpO2 99%   BMI 26.81 kg/m   General: Well-appearing, no distress Respiratory: Even, Unlabored breathing  ASSESSMENT/PLAN:   Assessment & Plan Type 2 diabetes mellitus without complication, with long-term current use of insulin  (HCC) - After reviewing patient's CGM, lunchtime spikes appear to plateau rather than coming down right away -Increase lunchtime insulin  to 6 units -Patient will monitor blood sugar at 2 hours postlunch and if blood sugar is still greater than 200 will administer another 2 units of insulin  -No other changes to regimen at this time -Follow-up in 3 weeks   Lucie Pinal, DO [facility]

## 2024-10-07 NOTE — Patient Instructions (Addendum)
 It was wonderful to see you today!  [name] today was 8.5. This means your sugars are well controlled. Please continue to check your blood sugars daily, and take all of your medications, even when you feel well.   We made the following adjustments to your medication regimen:  - increased lunchtime insulin  to 6 units  We checked the following labs today:  - Hgb A1c   If any of your labs are abnormal you will receive a phone call from me to discuss follow up, otherwise your results will be available in MyChart within the next few days.   Please reach out to your pharmacy for any medication refills.   Be Well, Dr. Lucie Pinal, DO

## 2024-10-07 NOTE — Assessment & Plan Note (Signed)
-   After reviewing patient's CGM, lunchtime spikes appear to plateau rather than coming down right away -Increase lunchtime insulin  to 6 units -Patient will monitor blood sugar at 2 hours postlunch and if blood sugar is still greater than 200 will administer another 2 units of insulin  -No other changes to regimen at this time -Follow-up in 3 weeks

## 2024-10-12 ENCOUNTER — Other Ambulatory Visit: Payer: Self-pay | Admitting: Family Medicine

## 2024-10-28 ENCOUNTER — Ambulatory Visit: Admitting: Family Medicine

## 2024-11-11 ENCOUNTER — Other Ambulatory Visit: Payer: Self-pay | Admitting: Family Medicine

## 2024-11-11 DIAGNOSIS — E119 Type 2 diabetes mellitus without complications: Secondary | ICD-10-CM

## 2024-11-12 ENCOUNTER — Other Ambulatory Visit (HOSPITAL_COMMUNITY)
Admission: RE | Admit: 2024-11-12 | Discharge: 2024-11-12 | Disposition: A | Discharge status: Home / Self Care | Source: Ambulatory Visit | Attending: Family Medicine | Admitting: Family Medicine

## 2024-11-12 ENCOUNTER — Encounter: Payer: Self-pay | Admitting: Family Medicine

## 2024-11-12 ENCOUNTER — Ambulatory Visit (INDEPENDENT_AMBULATORY_CARE_PROVIDER_SITE_OTHER): Admitting: Family Medicine

## 2024-11-12 VITALS — BP 125/76 | HR 84 | Ht 64.0 in | Wt 154.4 lb

## 2024-11-12 DIAGNOSIS — E119 Type 2 diabetes mellitus without complications: Secondary | ICD-10-CM

## 2024-11-12 DIAGNOSIS — N898 Other specified noninflammatory disorders of vagina: Secondary | ICD-10-CM

## 2024-11-12 DIAGNOSIS — Z23 Encounter for immunization: Secondary | ICD-10-CM

## 2024-11-12 DIAGNOSIS — Z794 Long term (current) use of insulin: Secondary | ICD-10-CM | POA: Diagnosis not present

## 2024-11-12 LAB — CERVICOVAGINAL ANCILLARY ONLY
Bacterial Vaginitis (gardnerella): NEGATIVE
Candida Glabrata: NEGATIVE
Candida Vaginitis: POSITIVE — AB
Comment: NEGATIVE
Comment: NEGATIVE
Comment: NEGATIVE

## 2024-11-12 NOTE — Progress Notes (Signed)
" ° ° °  SUBJECTIVE:   CHIEF COMPLAINT / HPI:   Follow up on insulin  regimen adjustments - still having overnight lows into the 60s - improved daytime control with last adjustment, now consistently below 250 at lunch time  She is also reporting vaginal itching. She denies any urinary pain or frequency. She reports that using vagisil itch cream has helped, but she is concerned she may have an infection.  PERTINENT  PMH / PSH: T2DM, HTN  OBJECTIVE:   BP 125/76   Pulse 84   Ht 5' 4 (1.626 m)   Wt 154 lb 6 oz (70 kg)   LMP  (LMP Unknown)   SpO2 99%   BMI 26.50 kg/m   General: Well appearing, no distress Respiratory: Even, unlabored breathing  ASSESSMENT/PLAN:   Assessment & Plan Type 2 diabetes mellitus without complication, with long-term current use of insulin  (HCC) -continue with breakfast and dinner doses of insulin  -increase lunchtime dose to 8 units - try to eat a small snack like an apple or orange before bed -follow up in one month. Vaginal itching - aptima self swab obtained today - will treat if necessary based on results. - instructed her to continue using Vagisil for symptomatic relief   Lucie Pinal, DO [facility] "

## 2024-11-12 NOTE — Assessment & Plan Note (Signed)
-  continue with breakfast and dinner doses of insulin  -increase lunchtime dose to 8 units - try to eat a small snack like an apple or orange before bed -follow up in one month.

## 2024-11-12 NOTE — Patient Instructions (Addendum)
 It was wonderful to see you today!  Please continue to check your blood sugars daily, and take all of your medications, even when you feel well.   We made the following adjustments to your medication regimen:  - keep doing 2 units with breakfast and dinner - increase the lunchtime dose to 8 units - eat a snack before bed  For the itching you reported, we did a swab to test for yeast and BV. If the swab comes back positive, I will call you and send in a pill to treat it.   If any of your labs are abnormal you will receive a phone call from me to discuss follow up, otherwise your results will be available in MyChart within the next few days.   Please reach out to your pharmacy for any medication refills.   Be Well, Dr. Lucie Pinal, DO

## 2024-11-13 ENCOUNTER — Ambulatory Visit: Payer: Self-pay | Admitting: Family Medicine

## 2024-11-13 DIAGNOSIS — B3731 Acute candidiasis of vulva and vagina: Secondary | ICD-10-CM

## 2024-11-13 MED ORDER — FLUCONAZOLE 150 MG PO TABS: 150.0000 mg | ORAL_TABLET | Freq: Once | ORAL | 0 refills | Status: AC

## 2024-11-22 ENCOUNTER — Other Ambulatory Visit: Payer: Self-pay | Admitting: Family Medicine

## 2024-11-22 DIAGNOSIS — Z1231 Encounter for screening mammogram for malignant neoplasm of breast: Secondary | ICD-10-CM

## 2024-12-02 ENCOUNTER — Ambulatory Visit: Payer: Self-pay | Admitting: Family Medicine

## 2024-12-12 ENCOUNTER — Ambulatory Visit
Admission: RE | Admit: 2024-12-12 | Discharge: 2024-12-12 | Disposition: A | Discharge status: Home / Self Care | Source: Ambulatory Visit | Attending: Family Medicine | Admitting: Family Medicine

## 2024-12-12 DIAGNOSIS — Z1231 Encounter for screening mammogram for malignant neoplasm of breast: Secondary | ICD-10-CM

## 2024-12-18 ENCOUNTER — Telehealth: Payer: Self-pay

## 2024-12-18 NOTE — Telephone Encounter (Signed)
 Patient calls nurse line reporting vaginal itching vs UTI.  She reports she feels she has another UTI. She reports she had one back in December and reports completing the antibiotics.   She reports her urine is normal, however she is having some vaginal itching. She denies any discharge. She denies any dysuria, fevers, abdominal pain or back pain.   Patient has an apt with PCP for tomorrow.   Advised will forward to PCP to make aware.   Encouraged hydration.

## 2024-12-19 ENCOUNTER — Other Ambulatory Visit (HOSPITAL_COMMUNITY): Admission: RE | Admit: 2024-12-19 | Source: Ambulatory Visit

## 2024-12-19 ENCOUNTER — Encounter: Payer: Self-pay | Admitting: Family Medicine

## 2024-12-19 ENCOUNTER — Ambulatory Visit: Admitting: Family Medicine

## 2024-12-19 VITALS — BP 133/79 | HR 90 | Ht 64.0 in | Wt 159.0 lb

## 2024-12-19 DIAGNOSIS — E119 Type 2 diabetes mellitus without complications: Secondary | ICD-10-CM

## 2024-12-19 DIAGNOSIS — N898 Other specified noninflammatory disorders of vagina: Secondary | ICD-10-CM

## 2024-12-19 LAB — POCT URINALYSIS DIP (MANUAL ENTRY)
Bilirubin, UA: NEGATIVE
Blood, UA: NEGATIVE
Glucose, UA: >=1000 mg/dL — AB
Ketones, POC UA: NEGATIVE mg/dL
Leukocytes, UA: NEGATIVE
Nitrite, UA: NEGATIVE
Protein Ur, POC: NEGATIVE mg/dL
Spec Grav, UA: <=1.005 — AB
Urobilinogen, UA: 0.2 U/dL
pH, UA: 5.5

## 2024-12-19 NOTE — Assessment & Plan Note (Signed)
-   stable with improved daytime control - predicted A1c is 6.6% based on CGM data - continue current insulin  regimen, follow up in one month to repeat A1c.

## 2024-12-19 NOTE — Patient Instructions (Signed)
 It was wonderful to see you today!  Your blood sugar levels are under much better control. Stick with the same regimen you have been on, and we will recheck in one month with an A1c.   For your vaginal itching and discharge, I suspect you have a yeast infection. We did a swab today and a urine test to look for any other causes, and I will call you when I have the results. In the meantime, you can use a moisturizer like replens to help with any dryness or irritation, and I would like you to hold your Jardiance  while we treat your infection.   Please call [phone] with any questions about today's appointment.   If you need any additional refills, please call your pharmacy before calling the office.  Lucie Pinal, DO Family Medicine

## 2024-12-19 NOTE — Progress Notes (Signed)
" ° ° °  SUBJECTIVE:   CHIEF COMPLAINT / HPI:   Diabetes follow up plus vaginal itching and discomfort - avg blood sugar for the last 30 days has been 138, peaks around 180 - current regimen working well   2-3 days of itching and discharge from the vagina, as well as urinary frequency. Denies burning and abdominal pain.   PERTINENT  PMH / PSH: T2DM with insulin  use, HTN, CKD  OBJECTIVE:   BP 133/79   Pulse 90   Ht 5' 4 (1.626 m)   Wt 159 lb (72.1 kg)   LMP  (LMP Unknown)   SpO2 99%   BMI 27.29 kg/m   General: A&O, NAD Cardiac: RRR, no m/r/g Respiratory: CTAB, normal WOB, no w/c/r GU: Thersia Meissner present as chaperone. Normal appearing external female genitalia. Internal vaginal mucosa is pale and mildly atrophic. Small amount of thin white discharge present in the vaginal vault. No erythema or lesions present.   ASSESSMENT/PLAN:   Assessment & Plan Type 2 diabetes mellitus without complication, with long-term current use of insulin  (HCC) - stable with improved daytime control - predicted A1c is 6.6% based on CGM data - continue current insulin  regimen, follow up in one month to repeat A1c.  Vaginal irritation -UA and aptima swab today to assess for infection - likely multifactorial, with some concern for postmenopausal dryness - recommend vaginal moisturizers, will plan to treat infection if present.    Lucie Pinal, DO [facility] "

## 2024-12-20 NOTE — Telephone Encounter (Signed)
 Patient calls nurse line requesting lab results from yesterday.   Advised swab will probably not be back until next.  She is reporting continuing burning and vaginal itching.   She is requesting something for the weekend.   Advised will forward to PCP.

## 2025-05-15 ENCOUNTER — Encounter
